# Patient Record
Sex: Female | Born: 1935 | Race: White | Hispanic: No | Smoking: Never smoker
Health system: Southern US, Community
[De-identification: ages and names within clinical notes are randomized; demographics above are authoritative.]

## PROBLEM LIST (undated history)

## (undated) DIAGNOSIS — Z974 Presence of external hearing-aid: Secondary | ICD-10-CM

## (undated) DIAGNOSIS — I639 Cerebral infarction, unspecified: Secondary | ICD-10-CM

## (undated) DIAGNOSIS — I350 Nonrheumatic aortic (valve) stenosis: Secondary | ICD-10-CM

## (undated) DIAGNOSIS — F329 Major depressive disorder, single episode, unspecified: Secondary | ICD-10-CM

## (undated) DIAGNOSIS — I671 Cerebral aneurysm, nonruptured: Secondary | ICD-10-CM

## (undated) DIAGNOSIS — I10 Essential (primary) hypertension: Secondary | ICD-10-CM

## (undated) DIAGNOSIS — I340 Nonrheumatic mitral (valve) insufficiency: Secondary | ICD-10-CM

## (undated) DIAGNOSIS — I4891 Unspecified atrial fibrillation: Secondary | ICD-10-CM

## (undated) DIAGNOSIS — I2510 Atherosclerotic heart disease of native coronary artery without angina pectoris: Secondary | ICD-10-CM

## (undated) DIAGNOSIS — R42 Dizziness and giddiness: Secondary | ICD-10-CM

## (undated) DIAGNOSIS — E785 Hyperlipidemia, unspecified: Secondary | ICD-10-CM

## (undated) DIAGNOSIS — R490 Dysphonia: Secondary | ICD-10-CM

## (undated) DIAGNOSIS — S42309A Unspecified fracture of shaft of humerus, unspecified arm, initial encounter for closed fracture: Secondary | ICD-10-CM

## (undated) HISTORY — DX: Atherosclerotic heart disease of native coronary artery without angina pectoris: I25.10

## (undated) HISTORY — PX: ABDOMINAL HYSTERECTOMY: SHX81

## (undated) HISTORY — DX: Unspecified fracture of shaft of humerus, unspecified arm, initial encounter for closed fracture: S42.309A

## (undated) HISTORY — DX: Nonrheumatic mitral (valve) insufficiency: I34.0

## (undated) HISTORY — DX: Nonrheumatic aortic (valve) stenosis: I35.0

## (undated) HISTORY — DX: Dysphonia: R49.0

## (undated) HISTORY — DX: Hyperlipidemia, unspecified: E78.5

## (undated) HISTORY — DX: Unspecified atrial fibrillation: I48.91

## (undated) HISTORY — PX: FRACTURE SURGERY: SHX138

## (undated) HISTORY — PX: CHOLECYSTECTOMY: SHX55

## (undated) HISTORY — PX: APPENDECTOMY: SHX54

## (undated) HISTORY — DX: Major depressive disorder, single episode, unspecified: F32.9

---

## 2003-04-23 ENCOUNTER — Other Ambulatory Visit: Payer: Self-pay

## 2004-03-04 ENCOUNTER — Ambulatory Visit: Payer: Self-pay | Admitting: Family Medicine

## 2004-08-16 ENCOUNTER — Emergency Department: Payer: Self-pay | Admitting: Emergency Medicine

## 2004-08-17 ENCOUNTER — Inpatient Hospital Stay (HOSPITAL_COMMUNITY): Admission: AD | Admit: 2004-08-17 | Discharge: 2004-08-24 | Payer: Self-pay | Admitting: *Deleted

## 2004-08-19 ENCOUNTER — Ambulatory Visit: Payer: Self-pay | Admitting: Internal Medicine

## 2005-02-11 ENCOUNTER — Ambulatory Visit (HOSPITAL_COMMUNITY): Admission: RE | Admit: 2005-02-11 | Discharge: 2005-02-12 | Payer: Self-pay | Admitting: *Deleted

## 2006-05-03 ENCOUNTER — Other Ambulatory Visit: Payer: Self-pay

## 2006-05-03 ENCOUNTER — Inpatient Hospital Stay: Payer: Self-pay | Admitting: Cardiology

## 2006-05-04 ENCOUNTER — Other Ambulatory Visit: Payer: Self-pay

## 2006-05-24 ENCOUNTER — Encounter: Payer: Self-pay | Admitting: Cardiology

## 2008-05-25 ENCOUNTER — Emergency Department: Payer: Self-pay | Admitting: Emergency Medicine

## 2008-12-17 ENCOUNTER — Emergency Department: Payer: Self-pay | Admitting: Emergency Medicine

## 2009-02-25 ENCOUNTER — Inpatient Hospital Stay: Payer: Self-pay | Admitting: Internal Medicine

## 2009-03-20 ENCOUNTER — Ambulatory Visit: Payer: Self-pay | Admitting: Gastroenterology

## 2009-04-05 ENCOUNTER — Inpatient Hospital Stay: Payer: Self-pay | Admitting: Internal Medicine

## 2009-12-15 ENCOUNTER — Ambulatory Visit: Payer: Self-pay | Admitting: Pain Medicine

## 2010-06-18 NOTE — Discharge Summary (Signed)
NAMESHALLA, BULLUCK               ACCOUNT NO.:  [id number]   MEDICAL RECORD NO.:  [id number]          PATIENT TYPE:  OIB   LOCATION:  6526                         FACILITY:  [facility]   PHYSICIAN:  Darlin Priestly, MD  DATE OF BIRTH:  01-Jan-1929   DATE OF ADMISSION:  02/11/2005  DATE OF DISCHARGE:  02/12/2005                                 DISCHARGE SUMMARY   Ms. Joerger is a patient of Dr. Lenise Herald, who came into the hospital for  outpatient cardiac catheterization secondary to increasing shortness of  breath, increasing fatigue, and some left-sided chest pain with known  disease in the past.  She underwent cardiac catheterization on February 11, 2005, by Dr. Lenise Herald.  She had a progressive disease with an 80% RCA  ostial lesion.  Previous PCI site was patent on her LAD.  She had a 50% OM2  lesion and a 50% circumflex.  Her EF was 60%.  She underwent cutting balloon  and stenting with a Cypher stent, 3.5 x 13 and 3.5 x 8, lesion reduced from  80 to 40%.  Postprocedure, she did well, however, she did have some 8/10 leg  pain on February 12, 2005.  It was running down the side of her leg.  The  groin area was sore, but it was soft, and there was no hematoma.  She was  given some Darvocet.  We thought that we might have to keep her overnight,  however, she had pain relief and was up and about after about one hour, and  was stable to be discharged home.  She had no chest pain.   LABORATORY DATA:  Hemoglobin 11.9, hematocrit 34.7, platelets 294,000, WBC  7.1, sodium 136, potassium 3.5, BUN 10, creatinine 1.0, CK 71/4.4 and  59/0.9.   She was seen by Dr. Jenne Campus and thought to be stable, and so she was  discharged home with followup in the office in two to three weeks.   DISCHARGE MEDICATIONS:  1.  Aspirin 81 mg daily.  2.  Plavix 75 mg once daily.  She is not to stop this.  3.  Inderal 30 mg once daily.  4.  Nexium 40 mg once daily.  5.  Lisinopril 40 mg once daily.  6.   Hydrochlorothiazide 25 mg daily.  7.  Fluoxetine 20 mg one time per day.  8.  Toprol-XL 50 mg one time per day.  9.  Vytorin 10/40 one time per day.  10. Darvocet-N 100 1-2 pills q.8h. for leg pain.   DISCHARGE INSTRUCTIONS:  She is to do no strenuous activity, lifting,  pushing or pulling for five days.  No extended walking for five days.  No  driving x2 days.   DIAGNOSIS DIAGNOSES:  1.  EUSAP with progressive coronary artery disease.  2.  Status post cutting balloon and right coronary artery Cypher stenting      with a 3.5 x 13 and 3.5 x 8 Cypher stent to her ostial right coronary      artery.  3.  Normal ejection fraction of 60%.  4.  Dyslipidemia.  5.  Hypertension.      Lezlie Octave, N.P.      Darlin Priestly, MD  Electronically Signed    BB/MEDQ  D:  02/14/2005  T:  02/15/2005  Job:  [id number]   cc:   Cay Schillings  Fax: [phone]

## 2010-06-18 NOTE — Discharge Summary (Signed)
[name], Samantha [name]               ACCOUNT NO.:  [id number]   MEDICAL RECORD NO.:  [id number]          PATIENT TYPE:  INP   LOCATION:  2040                         FACILITY:  [facility]   PHYSICIAN:  Darlin Priestly, MD  DATE OF BIRTH:  17-Jun-1928   DATE OF ADMISSION:  08/17/2004  DATE OF DISCHARGE:  08/24/2004                                 DISCHARGE SUMMARY   ADDENDUM:  [name] was all ready for discharge on August 19, 2004 when she developed  severe nausea and multiple vomiting and we ordered abdominal ultrasound and  the result revealed sludge in the gallbladder and increased thickness of the  gallbladder wall.  We requested GI consult and Dr. Leone Payor saw patient.  She  was diagnosed with acute cholecystitis and possible cholelithiasis and in  follow-up surgical consult was ordered and Dr. Ovidio Kin saw patient but  because of her recent CYPHER stent implantation patient was not considered a  suitable surgical candidate because she had to continue anticoagulation  therapy with Plavix.  She was started on IV antibiotic Unasyn and her  symptoms significantly improved.   On day of discharge, August 24, 2004 her vital signs were stable.  She was  free of pain in subxiphoid and right upper quadrant area.  Her white blood  count was 6000, hemoglobin 11.5, hematocrit 34.2, and platelets 309.  Potassium was 4.  Renal function stable.  BUN 8, creatinine 0.8.   Patient was discharged home in stable condition.  Her main problem remains  acute cholecystitis and recommendations were to continue antibiotics.  She  was switched to p.o. Augmentin the day prior to discharge and she would need  to follow up with her primary physician, Dr. Evelene Croon in [place] and number  was given for patient to call and schedule an appointment in one to two  weeks to be seen by him and all further recommendations are per her primary  physician as far as her cholecystitis treatment.  We need to keep it in mind  the  patient needs to continue Plavix therapy at least for six months after  CYPHER stent was inserted into the coronary artery.       MK/MEDQ  D:  08/24/2004  T:  08/24/2004  Job:  [id number]

## 2010-06-18 NOTE — Cardiovascular Report (Signed)
Samantha [name], Samantha [name]               ACCOUNT NO.:  [id number]   MEDICAL RECORD NO.:  [id number]          PATIENT TYPE:  OIB   LOCATION:  6526                         FACILITY:  [facility]   PHYSICIAN:  Darlin Priestly, MD  DATE OF BIRTH:  Jul 03, 1928   DATE OF PROCEDURE:  02/11/2005  DATE OF DISCHARGE:  02/12/2005                              CARDIAC CATHETERIZATION   PROCEDURE:  1.  Left heart catheterization.  2.  Coronary angiography.  3.  Left ventriculogram.  4.  Right coronary artery - ostial - cutting balloon angioplasty - placement      of new coronary stent x 2.   ATTENDING PHYSICIAN:  Darlin Priestly, M.D.   COMPLICATIONS:  None.   INDICATIONS FOR PROCEDURE:  Samantha [name] is a 77 year old female patient of Dr.  Cay [name] in [place] with a history of CAD status post PTCA and stenting of  her LAD by Dr. Allyson [name] in July 2006 with two 5 by 8 mm Taxus stents.  She did  have scattered disease of the circumflex as well as possible spasm of the  ostium of the RCA.  She has continued to complain of increasing chest pain  and shortness of breath and is now brought for repeat catheterization to  reassess her coronary anatomy.   DESCRIPTION OF PROCEDURE:  After giving informed written consent, the  patient was brought to the cardiac cath lab were the right and left groins  were shaved, prepped and draped in a sterile fashion.  ECG monitoring was  established.  Using modified Seldinger technique, a 6 French arterial sheath  was inserted in the right femoral artery.  6 French diagnostic catheters  were used to perform diagnostic angiography.   The left main is a medium to large size vessel with no significant disease.   The LAD is a medium size vessel coursing to the apex with two diagonal  branch.  The stent is noted in the mid portion of the LAD between the first  and second diagonals which appears patent.  The LAD in the mid and distal  portions is a small portion with no significant  disease.   The first and second diagonals were small to medium size vessels with a mild  60% ostial narrowing in the first diagonal.   The left circumflex is a medium size vessel which courses to the AV groove  and gives rise to two obtuse marginal branches, though the first OM appears  to be totally occluded at its ostium.  There is a 50% mid AV groove  circumflex lesion.  The second OM is a medium size vessel which bifurcates  distally with a 60-70% distal mid stenosis.   The right coronary artery is a large vessel which is dominant and gives rise  to both PDA and posterolateral branch.  There is an 80-90% ostial RCA lesion  with pressure dampening when engaged.   Left ventriculogram reveals preserved EF of 60%.   HEMODYNAMICS:  Systemic arterial pressure 147/73.   INTERVENTIONAL PROCEDURE:  RCA - ostial.  Following diagnostic angiography,  a number 6 [place] JR4  guiding catheter with sideholes was used to engage the  right coronary ostium.  A 0.014 Asahi guide-wire was used to cross the  ostial lesion and positioned in the distal PDA without difficulty.  Next, a  3 by 6 mm cutting balloon was then positioned in the ostial portion of the  RCA and two inflations to 6 atmospheres were performed for a total of  approximately one minute.  Follow up angiogram revealed good luminal gain.  Next, a 3.5 by 13 mm Cypher stent was then positioned at what appeared to be  ostial portion of the RCA.  This stent was deployed to 12 atmospheres for a  total of 30 seconds.  A second inflation to 16 atmospheres was performed for  approximately 25 seconds.  Follow up angiogram revealed good luminal gain,  however, it appeared that the ostial portion of the vessel had been missed.  We were ultimately able to re-wire the vessel and perform a PTCA using a 3.5  by 15 mm Maverick balloon up to approximately 10 atmospheres.  This balloon  was then removed and a second Cypher 3.5 by 8 mm stent was then used  to  overlap the previously placed stent with careful attention to extending  across the ostium.  This stent was then deployed to 12 atmospheres for a  total of 30 seconds.  A second inflation to 16 atmospheres was performed for  approximately 20 seconds.  One inflation in the overlapping segment of the  stent was performed for a total to 16 atmospheres performed for  approximately 15 seconds.  Follow up angiogram revealed good luminal gain  with no evidence of dissection or thrombus.  IV Angiomax was used throughout  the case.   Final follow up angiograms revealed approximately 20-30% residual ostial  stenosis with TIMI III flow to the distal vessel.  At this point, we elected  to conclude the procedure.  All balloons, wires, and catheters were removed.  A hemostatic sheath was sewn in place.  The patient was transferred back to  the ward in stable condition.   CONCLUSION:  1.  Successful cutting balloon angioplasty and placement of two Cypher      stents (3.5 by 13, 3.5 by 8), in the proximal and ostial portion of the      RCA.  2.  Normal LV systolic function.  3.  Adjuvant use of Angiomax infusion.  4.  Enrollment in the [place] study looking at IV Cangrelor versus Plavix.      Darlin Priestly, MD  Electronically Signed     RHM/MEDQ  D:  02/11/2005  T:  02/13/2005  Job:  [id number]   cc:   Samantha [name], M.D.

## 2010-06-18 NOTE — H&P (Signed)
NAMEERYCA, BOLTE NO.:  [id number]   MEDICAL RECORD NO.:  [id number]          PATIENT TYPE:  INP   LOCATION:  2930                         FACILITY:  [facility]   PHYSICIAN:  Ulyses Amor, MD DATE OF BIRTH:  03-21-1928   DATE OF ADMISSION:  08/17/2004  DATE OF DISCHARGE:                                HISTORY & PHYSICAL   REASON FOR ADMISSION:  Samantha [name] is a 77 year old white woman with is  transferred from [facility] to [facility] for further evaluation of chest pain.   HISTORY OF PRESENT ILLNESS:  The patient, who has no past history of cardiac  disease, experienced sudden onset of chest pain this evening while she was  putting her mother to bed, though heavy physical exertion was involved. The  chest pain was described as a sharp discomfort located in a very focal  region at the left parasternal margin second intercostal space. It was  associated with dyspnea. There was no diaphoresis or nausea. The chest pain  radiated to beneath both breasts. There were no exacerbating or ameliorating  factors. It appeared not to be related to position, activity, meals, or  respirations. The chest pain continued through the time she arrived in the  emergency department at [facility]. It was improved, she reported, with some  pain medication. The patient's chest pain is present now but has diminished  significantly since onset.   As noted, the patient has no past history of cardiac disease including no  history of myocardial infarction, congestive heart failure, or arrhythmias.  She has experienced a similar pain in the past and recently underwent stress  testing in [place]. Her test was reported normal. The patient has a  number of risk factors for coronary artery disease including hypertension,  dyslipidemia, and a family history of coronary artery disease. There is no  history of diabetes mellitus or smoking.   The  patient also has a history of anxiety and depression.   MEDICATIONS:  Lisinopril, Nexium, Zocor, Atenolol, Clonazepam, and  fluoxetine.   ALLERGIES:  None.   SOCIAL HISTORY:  The patient lives with her mother. She denies smoking or  drinking. She does work.   PAST MEDICAL HISTORY:  Significant injuries are none.   PAST SURGICAL HISTORY:  1.  Mastoid surgery.  2.  Appendectomy.  3.  Hysterectomy.   REVIEW OF SYSTEMS:  Reveals no problems related to her head, eyes, ears,  nose, mouth, throat, lungs, gastrointestinal system, genitourinary system,  or extremities. There was no history of neurologic or psychiatric disorder.  There is no history of fever, chills, or weight loss.   PHYSICAL EXAMINATION:  VITAL SIGNS:  Blood pressure 145/43, pulse 73 and  regular, respirations 21. Pulse oximeter 96% on two liters.  GENERAL:  The patient was an elderly white woman in no discomfort. She was  alert, oriented, appropriate, and responsive.  HEENT:  Normal.  NECK:  Without thyromegaly or adenopathy. Carotid pulses were palpable  bilaterally and symmetric.  CARDIOVASCULAR:  Normal S1 and S2. There was no S3, S4, rubs, or  clicks.  Cardiac rate was regular.  CHEST:  Palpation of the left intercostal space at the left parasternal  margin exacerbated the chest pain.  LUNGS:  Clear.  ABDOMEN:  Soft and nontender. There was no mass, hepatosplenomegaly, bruit,  distension, rebound, guarding, or rigidity. Bowel sounds were normal.  BREASTS:  Not performed as they were not pertinent for the reason for acute  care hospitalization.  RECTAL:  Not performed as they were not pertinent for the reason for acute  care hospitalization.  PELVIC:  Not performed as they were not pertinent for the reason for acute  care hospitalization.  EXTREMITIES:  Without edema, deviation, or deformity.  PULSES:  Radial and dorsalis pedis pulses were palpable bilaterally.  NEUROLOGICAL:  Unremarkable.   LABORATORY DATA:   The electrocardiogram revealed T-wave flattening in the  lateral leads. The chest radiograph was pending at the time of this  dictation. Blood work from [facility] revealed a BUN of 14, creatinine of 0.8.  White count was 8.8 with hemoglobin 13.4, and 35.0. The blood work from  [facility] was pending at the time of this dictation.   IMPRESSION:  1.  Chest pain:  Rule out coronary artery disease. The chest pain is sharp,      focal (left parasternal margin/second intercostal space), radiates below      both breasts bilaterally, and is exacerbated by chest wall palpation.      She has a recent history of a similar such pain, a stress test was      reportedly negative.  2.  Hypertension.  3.  Dyslipidemia.  4.  Anxiety and depression.   PLAN:  1.  Coronary Care Unit.  2.  Serial cardiac enzymes.  3.  Aspirin.  4.  Intravenous heparin.  5.  Intravenous nitroglycerin.  6.  Morphine.  7.  Fasting lipid profile.  8.  Further measures per Dr. Darlin Priestly.       MSC/MEDQ  D:  08/17/2004  T:  08/17/2004  Job:  [id number]   cc:   Darlin Priestly, MD  [address], [address]  [place]  [place] [address]  Fax: [phone]

## 2010-06-18 NOTE — Discharge Summary (Signed)
Samantha [name], Samantha [name]               ACCOUNT NO.:  [id number]   MEDICAL RECORD NO.:  [id number]          PATIENT TYPE:  INP   LOCATION:  2930                         FACILITY:  [facility]   PHYSICIAN:  Nanetta Batty, M.D.   DATE OF BIRTH:  1929/01/21   DATE OF ADMISSION:  08/17/2004  DATE OF DISCHARGE:  08/18/2004                                 DISCHARGE SUMMARY   DISCHARGE DIAGNOSES:  1.  Chest pain, status post coronary intervention during this admission.  2.  Gastroesophageal reflux disease.  3.  Hyperlipidemia.  4.  Hypertension.  5.  Major anxiety disorder.   HISTORY OF PRESENT ILLNESS/HOSPITAL COURSE:  This 77 year old Caucasian  female who was transferred to [facility] from [facility] where she was evaluated initially for chest pain.  She has a history of hypertension, hyperlipidemia but no prior history of  coronary disease.  She has experienced an onset of sudden chest pain the  evening prior to her presentation and describes it as a sharp discomfort in  the focal region at the left pars adrenal margin __________.  There was no  diaphoresis or nausea but she also complained of dyspnea.  Patient also  noted the pain radiated under her breasts bilaterally and appeared not to be  related to her position, activity, meals or respirations.  Continued through  the whole time she arrived in the emergency department in [place] and  improved somewhat with the administration of pain medication.  When she was  transferred to [facility], her cardiac enzymes were  normal.  Dr. Clarene Duke saw patient and decision was made to proceed with the  cardiac catheterization.   PROCEDURE:  Coronary angiography performed by Dr. Allyson Sabal.  It showed two  vessel heart disease with LAD and circumflex involvement.  Circumflex had  50% mid and distal lesions and LAD had 40% proximal lesion, 90% mid portion  stenosis and 50 to 60% distal lesion.   Dr. Allyson Sabal performed stenting of the  LAD using TAXUS stent with reduction of the lesion from 90 to 0%.  Patient  was given Integrilin bolus and drip over night.   The next morning she was evaluated by Dr. Clarene Duke and was found to be stable  from cardiac standpoint.  Vital signs remained stable and she was discharged  home on the following medications.   DISCHARGE MEDICATIONS:  1.  Aspirin 81 mg daily.  2.  Plavix 75 mg daily.  3.  Lisinopril 20 mg daily.  4.  Nexium 40 mg daily.  5.  Atenolol 50 mg daily.  6.  Zocor 20 mg daily.  7.  Clonazepam 0.5 to 1 mg as needed.  8.  Fluoxetine 20 mg daily.   FOLLOW UP APPOINTMENT:  Dr. Jenne Campus will see the patient on September 03, 2004,  at 3:45.   DISCHARGE DIET:  Low fat, low cholesterol, low salt diet.   ACTIVITY:  She was advised not to engage in any strenuous activities, no  lifting greater than five pounds, no driving for three days. She was allowed  to take a shower, instructed to report any problems with groin site to our  office and number was provided.       MK/MEDQ  D:  08/18/2004  T:  08/18/2004  Job:  [id number]   cc:   [facility]

## 2010-06-18 NOTE — Cardiovascular Report (Signed)
NAMEBETHLEHEM, LANGSTAFF NO.:  [id number]   MEDICAL RECORD NO.:  [id number]          PATIENT TYPE:  INP   LOCATION:  2930                         FACILITY:  [facility]   PHYSICIAN:  Nanetta Batty, M.D.   DATE OF BIRTH:  16-Jun-1928   DATE OF PROCEDURE:  08/17/2004  DATE OF DISCHARGE:                              CARDIAC CATHETERIZATION   Samantha [name] is a 77 year old white female admitted yesterday in transfer from  [facility] with chest pain. She has history of hypertension or  dyslipidemia. She had no acute electrocardiogram changes and ruled out for  myocardial infarction. She presents now for diagnostic coronary  arteriography to define her anatomy, to rule out an ischemic etiology.   PROCEDURE DESCRIPTION:  The patient is brought to the second floor [facility] cardiac catheterization laboratory in the post absorptive state. She  was premedicated with p.o. Valium, IV Versed, and fentanyl. Her right groin  was prepped and shaved in the usual sterile fashion. 1% Xylocaine was used  for local anesthesia. A 6-French sheath was inserted into the right femoral  artery using a standard Seldinger technique. A 6-French right and left  Judkins' diagnostic catheter as well as a 6-French pigtail catheter and 5-  French right Judkins' catheter were used for selective coronary angiography,  left ventriculography, selective left internal mammary artery arteriography,  and distal abdominal aortography. Visipaque dye was used throughout the  entire case. Retrograde aorta, left ventricle, and pulmonary artery  pressures were recorded.   HEMODYNAMICS:  1.  Aortic systolic pressure 151, diastolic pressure 73. Left ventricular      systolic function 144 and diastolic pressure of 21.   CORONARY ANGIOGRAPHY:  1.  Left main normal.  2.  LAD: The LAD had a segmental calcified 40% proximal stenosis after the      first diagonal branch. There was a 90% eccentric stenosis in the mid  LAD      just prior to the second diagonal branch. There was a 50% to 60% distal      segmental LAD lesion.  3.  Left circumflex: The distal had a 50% long segmental mid AV groove      circumflex, a 50% distal segmental circumflex lesion.  4.  Right coronary artery: This is a dominant vessel with mild anterior      takeoff. There was spasm noted at the ostium with dampening which      resolved with intracoronary nitroglycerin.   LEFT VENTRICULOGRAPHY:  RAO left ventriculogram was performed using 20 cc of  Visipaque dye at 10 cc per second. The overall  LVEF is estimated at greater  than 60% without focal wall motion abnormalities.  Left internal mammary artery: This vessel was selectively visualized and was  widely patent and suitable for use in coronary artery bypass grafting.   DISTAL ABDOMINAL AORTOGRAPHY:  Performed using 20 cc of Visipaque dye at  20  cc per second. The renal artery was widely patent. The infrarenal, abdominal  aorta, and __________ appear free of significant changes.   IMPRESSION:  Ms. Hakim has high-grade mid eccentric left  anterior descending  lesion just proximal to the second diagonal branch. We will proceed with PCI  stenting, drug eluting stent, and IIb/IIIa inhibitor.   PROCEDURE DESCRIPTION:  The patient received 3000 units of heparin  intravenously with an ACT of 238 at the end. She was on aspirin and received  600 mg of p.o. Plavix as well as Integrilin double bolus infusion. Visipaque  dye was used throughout the entirety of the intervention. Aortic pressures  were monitored during the case.  200 mcg of intracoronary nitroglycerin was  given twice during the case.   Using a 6-French JL35 guide catheter as well as an OM4190 cm long Asahi soft  wire and a 225-10 Voyager PTCA was performed abdominal pressures. Cutting  balloon initially would not cross the lesion. Following this a 2.5 x  10  Taxus drug-eluting stent was then carefully manipulated across  the lesion  under fluoroscopic and angiographic control, landing just proximal to the  second diagonal branch. This was then deployed at 15 atmospheres, resulting  in reduction of a 90% eccentric mid LAD lesion with 0% residual, with  excellent flow and without dissection. The patient tolerated the procedure  well. She did complain of some chest and arm pain and had mild ST segment  elevation with balloon inflation which resolved with balloon deflation.   OVERALL IMPRESSION:  Successful mid left anterior descending PCI and  stenting using Taxus drug-eluting stent and Integrilin. Guide wire and  catheters were removed. The sheath was upgraded to a 7 because of oozing  around the sheath. It was since secured in place. The patient left the lab  in stable condition.   PLAN:  Discontinue heparin, remove the sheath once ACT falls to 150.  Integrilin will be continued overnight. She will be treated with aspirin and  Plavix, and discharged home in the morning. She left the lab in stable  condition.       JB/MEDQ  D:  08/17/2004  T:  08/17/2004  Job:  [id number]   cc:   [facility] Cardiac Cath lab

## 2010-12-07 ENCOUNTER — Ambulatory Visit: Payer: Self-pay | Admitting: Ophthalmology

## 2011-03-21 DIAGNOSIS — I10 Essential (primary) hypertension: Secondary | ICD-10-CM | POA: Diagnosis not present

## 2011-04-04 DIAGNOSIS — H903 Sensorineural hearing loss, bilateral: Secondary | ICD-10-CM | POA: Diagnosis not present

## 2011-04-04 DIAGNOSIS — H7010 Chronic mastoiditis, unspecified ear: Secondary | ICD-10-CM | POA: Diagnosis not present

## 2011-10-21 DIAGNOSIS — G4700 Insomnia, unspecified: Secondary | ICD-10-CM | POA: Insufficient documentation

## 2011-10-21 DIAGNOSIS — E538 Deficiency of other specified B group vitamins: Secondary | ICD-10-CM | POA: Insufficient documentation

## 2011-10-24 DIAGNOSIS — F329 Major depressive disorder, single episode, unspecified: Secondary | ICD-10-CM | POA: Diagnosis not present

## 2011-10-24 DIAGNOSIS — Z23 Encounter for immunization: Secondary | ICD-10-CM | POA: Diagnosis not present

## 2011-10-24 DIAGNOSIS — I10 Essential (primary) hypertension: Secondary | ICD-10-CM | POA: Diagnosis not present

## 2011-10-24 DIAGNOSIS — E538 Deficiency of other specified B group vitamins: Secondary | ICD-10-CM | POA: Diagnosis not present

## 2011-10-24 DIAGNOSIS — E785 Hyperlipidemia, unspecified: Secondary | ICD-10-CM | POA: Diagnosis not present

## 2011-10-24 DIAGNOSIS — I2510 Atherosclerotic heart disease of native coronary artery without angina pectoris: Secondary | ICD-10-CM | POA: Diagnosis not present

## 2012-06-11 ENCOUNTER — Emergency Department: Payer: Self-pay | Admitting: Emergency Medicine

## 2012-06-11 DIAGNOSIS — I10 Essential (primary) hypertension: Secondary | ICD-10-CM | POA: Diagnosis not present

## 2012-06-11 DIAGNOSIS — Z9089 Acquired absence of other organs: Secondary | ICD-10-CM | POA: Diagnosis not present

## 2012-06-11 DIAGNOSIS — R079 Chest pain, unspecified: Secondary | ICD-10-CM | POA: Diagnosis not present

## 2012-06-11 DIAGNOSIS — S8010XA Contusion of unspecified lower leg, initial encounter: Secondary | ICD-10-CM | POA: Diagnosis not present

## 2012-06-11 DIAGNOSIS — Z95818 Presence of other cardiac implants and grafts: Secondary | ICD-10-CM | POA: Diagnosis not present

## 2012-06-11 DIAGNOSIS — E785 Hyperlipidemia, unspecified: Secondary | ICD-10-CM | POA: Diagnosis not present

## 2012-06-11 DIAGNOSIS — I2510 Atherosclerotic heart disease of native coronary artery without angina pectoris: Secondary | ICD-10-CM | POA: Diagnosis not present

## 2012-06-11 DIAGNOSIS — M79609 Pain in unspecified limb: Secondary | ICD-10-CM | POA: Diagnosis not present

## 2012-06-11 LAB — BASIC METABOLIC PANEL
Anion Gap: 3 — ABNORMAL LOW (ref 7–16)
BUN: 21 mg/dL — ABNORMAL HIGH (ref 7–18)
Chloride: 106 mmol/L (ref 98–107)
Potassium: 4.3 mmol/L (ref 3.5–5.1)
Sodium: 139 mmol/L (ref 136–145)

## 2012-06-11 LAB — TROPONIN I: Troponin-I: <0.02 ng/mL

## 2012-06-11 LAB — CBC
HCT: 37 % (ref 35.0–47.0)
MCHC: 33.3 g/dL (ref 32.0–36.0)
MCV: 91 fL (ref 80–100)
Platelet: 300 10*3/uL (ref 150–440)
RBC: 4.08 10*6/uL (ref 3.80–5.20)
RDW: 12.9 % (ref 11.5–14.5)

## 2012-06-11 LAB — CK TOTAL AND CKMB (NOT AT ARMC): CK, Total: 71 U/L (ref 21–215)

## 2012-06-13 DIAGNOSIS — S8010XA Contusion of unspecified lower leg, initial encounter: Secondary | ICD-10-CM | POA: Diagnosis not present

## 2012-06-15 DIAGNOSIS — S63509A Unspecified sprain of unspecified wrist, initial encounter: Secondary | ICD-10-CM | POA: Diagnosis not present

## 2012-06-15 DIAGNOSIS — S8010XA Contusion of unspecified lower leg, initial encounter: Secondary | ICD-10-CM | POA: Diagnosis not present

## 2012-08-31 DIAGNOSIS — I10 Essential (primary) hypertension: Secondary | ICD-10-CM | POA: Diagnosis not present

## 2012-08-31 DIAGNOSIS — Z7982 Long term (current) use of aspirin: Secondary | ICD-10-CM | POA: Diagnosis not present

## 2012-08-31 DIAGNOSIS — Z9861 Coronary angioplasty status: Secondary | ICD-10-CM | POA: Diagnosis not present

## 2012-08-31 DIAGNOSIS — Z4789 Encounter for other orthopedic aftercare: Secondary | ICD-10-CM | POA: Diagnosis not present

## 2012-08-31 DIAGNOSIS — I2510 Atherosclerotic heart disease of native coronary artery without angina pectoris: Secondary | ICD-10-CM | POA: Diagnosis not present

## 2012-08-31 DIAGNOSIS — S0993XA Unspecified injury of face, initial encounter: Secondary | ICD-10-CM | POA: Diagnosis not present

## 2012-08-31 DIAGNOSIS — E785 Hyperlipidemia, unspecified: Secondary | ICD-10-CM | POA: Diagnosis not present

## 2012-08-31 DIAGNOSIS — M47812 Spondylosis without myelopathy or radiculopathy, cervical region: Secondary | ICD-10-CM | POA: Diagnosis not present

## 2012-08-31 DIAGNOSIS — Z7901 Long term (current) use of anticoagulants: Secondary | ICD-10-CM | POA: Diagnosis not present

## 2012-08-31 DIAGNOSIS — M25529 Pain in unspecified elbow: Secondary | ICD-10-CM | POA: Diagnosis not present

## 2012-08-31 DIAGNOSIS — K219 Gastro-esophageal reflux disease without esophagitis: Secondary | ICD-10-CM | POA: Diagnosis not present

## 2012-08-31 DIAGNOSIS — S52023A Displaced fracture of olecranon process without intraarticular extension of unspecified ulna, initial encounter for closed fracture: Secondary | ICD-10-CM | POA: Diagnosis not present

## 2012-08-31 DIAGNOSIS — T1490XA Injury, unspecified, initial encounter: Secondary | ICD-10-CM | POA: Diagnosis not present

## 2012-08-31 DIAGNOSIS — R918 Other nonspecific abnormal finding of lung field: Secondary | ICD-10-CM | POA: Diagnosis not present

## 2012-08-31 DIAGNOSIS — R42 Dizziness and giddiness: Secondary | ICD-10-CM | POA: Diagnosis not present

## 2012-08-31 DIAGNOSIS — S42409A Unspecified fracture of lower end of unspecified humerus, initial encounter for closed fracture: Secondary | ICD-10-CM | POA: Diagnosis not present

## 2012-08-31 DIAGNOSIS — I69922 Dysarthria following unspecified cerebrovascular disease: Secondary | ICD-10-CM | POA: Diagnosis not present

## 2012-08-31 DIAGNOSIS — S0990XA Unspecified injury of head, initial encounter: Secondary | ICD-10-CM | POA: Diagnosis not present

## 2012-08-31 DIAGNOSIS — S42309A Unspecified fracture of shaft of humerus, unspecified arm, initial encounter for closed fracture: Secondary | ICD-10-CM | POA: Diagnosis not present

## 2012-08-31 DIAGNOSIS — M25519 Pain in unspecified shoulder: Secondary | ICD-10-CM | POA: Diagnosis not present

## 2012-09-03 DIAGNOSIS — I10 Essential (primary) hypertension: Secondary | ICD-10-CM | POA: Diagnosis not present

## 2012-09-03 DIAGNOSIS — M7989 Other specified soft tissue disorders: Secondary | ICD-10-CM | POA: Diagnosis not present

## 2012-09-03 DIAGNOSIS — S42309D Unspecified fracture of shaft of humerus, unspecified arm, subsequent encounter for fracture with routine healing: Secondary | ICD-10-CM | POA: Diagnosis not present

## 2012-09-04 DIAGNOSIS — R5381 Other malaise: Secondary | ICD-10-CM | POA: Diagnosis not present

## 2012-09-04 DIAGNOSIS — G8918 Other acute postprocedural pain: Secondary | ICD-10-CM | POA: Diagnosis not present

## 2012-09-04 DIAGNOSIS — Z7189 Other specified counseling: Secondary | ICD-10-CM | POA: Diagnosis not present

## 2012-09-04 DIAGNOSIS — Z5189 Encounter for other specified aftercare: Secondary | ICD-10-CM | POA: Diagnosis not present

## 2012-09-04 DIAGNOSIS — S5290XD Unspecified fracture of unspecified forearm, subsequent encounter for closed fracture with routine healing: Secondary | ICD-10-CM | POA: Diagnosis not present

## 2012-09-04 DIAGNOSIS — R262 Difficulty in walking, not elsewhere classified: Secondary | ICD-10-CM | POA: Diagnosis not present

## 2012-09-04 DIAGNOSIS — I10 Essential (primary) hypertension: Secondary | ICD-10-CM | POA: Diagnosis not present

## 2012-09-06 DIAGNOSIS — Z8673 Personal history of transient ischemic attack (TIA), and cerebral infarction without residual deficits: Secondary | ICD-10-CM | POA: Diagnosis not present

## 2012-09-06 DIAGNOSIS — IMO0001 Reserved for inherently not codable concepts without codable children: Secondary | ICD-10-CM | POA: Diagnosis not present

## 2012-09-06 DIAGNOSIS — Z4789 Encounter for other orthopedic aftercare: Secondary | ICD-10-CM | POA: Diagnosis not present

## 2012-09-06 DIAGNOSIS — I10 Essential (primary) hypertension: Secondary | ICD-10-CM | POA: Diagnosis not present

## 2012-09-06 DIAGNOSIS — Z9861 Coronary angioplasty status: Secondary | ICD-10-CM | POA: Diagnosis not present

## 2012-09-06 DIAGNOSIS — Z9889 Other specified postprocedural states: Secondary | ICD-10-CM | POA: Diagnosis not present

## 2012-09-10 DIAGNOSIS — S42309D Unspecified fracture of shaft of humerus, unspecified arm, subsequent encounter for fracture with routine healing: Secondary | ICD-10-CM | POA: Diagnosis not present

## 2012-09-24 DIAGNOSIS — S42309D Unspecified fracture of shaft of humerus, unspecified arm, subsequent encounter for fracture with routine healing: Secondary | ICD-10-CM | POA: Diagnosis not present

## 2012-10-09 DIAGNOSIS — M810 Age-related osteoporosis without current pathological fracture: Secondary | ICD-10-CM | POA: Diagnosis not present

## 2012-10-09 DIAGNOSIS — I10 Essential (primary) hypertension: Secondary | ICD-10-CM | POA: Diagnosis not present

## 2012-10-09 DIAGNOSIS — M79609 Pain in unspecified limb: Secondary | ICD-10-CM | POA: Diagnosis not present

## 2012-10-09 DIAGNOSIS — R51 Headache: Secondary | ICD-10-CM | POA: Diagnosis not present

## 2012-10-16 ENCOUNTER — Other Ambulatory Visit: Payer: Self-pay | Admitting: Family Medicine

## 2012-10-16 DIAGNOSIS — M79609 Pain in unspecified limb: Secondary | ICD-10-CM | POA: Diagnosis not present

## 2012-10-16 DIAGNOSIS — R51 Headache: Secondary | ICD-10-CM | POA: Diagnosis not present

## 2012-10-16 DIAGNOSIS — M899 Disorder of bone, unspecified: Secondary | ICD-10-CM | POA: Diagnosis not present

## 2012-10-16 DIAGNOSIS — R791 Abnormal coagulation profile: Secondary | ICD-10-CM | POA: Diagnosis not present

## 2012-10-16 LAB — SEDIMENTATION RATE: Erythrocyte Sed Rate: 30 mm/hr (ref 0–30)

## 2012-10-29 DIAGNOSIS — I10 Essential (primary) hypertension: Secondary | ICD-10-CM | POA: Diagnosis not present

## 2012-10-29 DIAGNOSIS — M810 Age-related osteoporosis without current pathological fracture: Secondary | ICD-10-CM | POA: Diagnosis not present

## 2012-11-07 DIAGNOSIS — D492 Neoplasm of unspecified behavior of bone, soft tissue, and skin: Secondary | ICD-10-CM | POA: Diagnosis not present

## 2012-11-07 DIAGNOSIS — I10 Essential (primary) hypertension: Secondary | ICD-10-CM | POA: Diagnosis not present

## 2012-11-22 DIAGNOSIS — Z23 Encounter for immunization: Secondary | ICD-10-CM | POA: Diagnosis not present

## 2013-03-19 DIAGNOSIS — R0602 Shortness of breath: Secondary | ICD-10-CM | POA: Diagnosis not present

## 2013-03-19 DIAGNOSIS — I499 Cardiac arrhythmia, unspecified: Secondary | ICD-10-CM | POA: Diagnosis not present

## 2013-03-20 ENCOUNTER — Inpatient Hospital Stay: Payer: Self-pay | Admitting: Internal Medicine

## 2013-03-20 DIAGNOSIS — J209 Acute bronchitis, unspecified: Secondary | ICD-10-CM | POA: Diagnosis present

## 2013-03-20 DIAGNOSIS — F3289 Other specified depressive episodes: Secondary | ICD-10-CM | POA: Diagnosis present

## 2013-03-20 DIAGNOSIS — I2510 Atherosclerotic heart disease of native coronary artery without angina pectoris: Secondary | ICD-10-CM | POA: Diagnosis not present

## 2013-03-20 DIAGNOSIS — J069 Acute upper respiratory infection, unspecified: Secondary | ICD-10-CM | POA: Diagnosis not present

## 2013-03-20 DIAGNOSIS — Z9861 Coronary angioplasty status: Secondary | ICD-10-CM | POA: Diagnosis not present

## 2013-03-20 DIAGNOSIS — I509 Heart failure, unspecified: Secondary | ICD-10-CM | POA: Diagnosis present

## 2013-03-20 DIAGNOSIS — R51 Headache: Secondary | ICD-10-CM | POA: Diagnosis not present

## 2013-03-20 DIAGNOSIS — R748 Abnormal levels of other serum enzymes: Secondary | ICD-10-CM | POA: Diagnosis not present

## 2013-03-20 DIAGNOSIS — E119 Type 2 diabetes mellitus without complications: Secondary | ICD-10-CM | POA: Diagnosis present

## 2013-03-20 DIAGNOSIS — I10 Essential (primary) hypertension: Secondary | ICD-10-CM | POA: Diagnosis not present

## 2013-03-20 DIAGNOSIS — R0602 Shortness of breath: Secondary | ICD-10-CM | POA: Diagnosis not present

## 2013-03-20 DIAGNOSIS — I248 Other forms of acute ischemic heart disease: Secondary | ICD-10-CM | POA: Diagnosis present

## 2013-03-20 DIAGNOSIS — I200 Unstable angina: Secondary | ICD-10-CM | POA: Diagnosis not present

## 2013-03-20 DIAGNOSIS — E785 Hyperlipidemia, unspecified: Secondary | ICD-10-CM | POA: Diagnosis present

## 2013-03-20 DIAGNOSIS — F329 Major depressive disorder, single episode, unspecified: Secondary | ICD-10-CM | POA: Diagnosis present

## 2013-03-20 DIAGNOSIS — I214 Non-ST elevation (NSTEMI) myocardial infarction: Secondary | ICD-10-CM | POA: Diagnosis not present

## 2013-03-20 DIAGNOSIS — R079 Chest pain, unspecified: Secondary | ICD-10-CM | POA: Diagnosis not present

## 2013-03-20 DIAGNOSIS — Z7902 Long term (current) use of antithrombotics/antiplatelets: Secondary | ICD-10-CM | POA: Diagnosis not present

## 2013-03-20 DIAGNOSIS — Z7982 Long term (current) use of aspirin: Secondary | ICD-10-CM | POA: Diagnosis not present

## 2013-03-20 LAB — URINALYSIS, COMPLETE
BACTERIA: NONE SEEN
BILIRUBIN, UR: NEGATIVE
Blood: NEGATIVE
GLUCOSE, UR: NEGATIVE mg/dL (ref 0–75)
Ketone: NEGATIVE
Leukocyte Esterase: NEGATIVE
Nitrite: NEGATIVE
PROTEIN: NEGATIVE
Ph: 5 (ref 4.5–8.0)
Specific Gravity: 1.018 (ref 1.003–1.030)
WBC UR: 2 /HPF (ref 0–5)

## 2013-03-20 LAB — COMPREHENSIVE METABOLIC PANEL
ALK PHOS: 88 U/L
ANION GAP: 9 (ref 7–16)
AST: 31 U/L (ref 15–37)
Albumin: 3.4 g/dL (ref 3.4–5.0)
BUN: 10 mg/dL (ref 7–18)
Bilirubin,Total: 0.3 mg/dL (ref 0.2–1.0)
Calcium, Total: 9 mg/dL (ref 8.5–10.1)
Chloride: 102 mmol/L (ref 98–107)
Co2: 24 mmol/L (ref 21–32)
Creatinine: 0.84 mg/dL (ref 0.60–1.30)
Glucose: 146 mg/dL — ABNORMAL HIGH (ref 65–99)
Osmolality: 272 (ref 275–301)
POTASSIUM: 3.7 mmol/L (ref 3.5–5.1)
SGPT (ALT): 28 U/L (ref 12–78)
Sodium: 135 mmol/L — ABNORMAL LOW (ref 136–145)
TOTAL PROTEIN: 7.2 g/dL (ref 6.4–8.2)

## 2013-03-20 LAB — CK TOTAL AND CKMB (NOT AT ARMC)
CK, TOTAL: 76 U/L
CK, Total: 81 U/L
CK, Total: 107 U/L
CK-MB: 0.6 ng/mL (ref 0.5–3.6)
CK-MB: 0.7 ng/mL (ref 0.5–3.6)
CK-MB: 1 ng/mL (ref 0.5–3.6)

## 2013-03-20 LAB — TROPONIN I
TROPONIN-I: 0.09 ng/mL — AB
TROPONIN-I: 0.06 ng/mL — AB
Troponin-I: <0.02 ng/mL

## 2013-03-20 LAB — CBC
HCT: 38.6 % (ref 35.0–47.0)
HGB: 13.6 g/dL (ref 12.0–16.0)
MCH: 32.3 pg (ref 26.0–34.0)
MCHC: 35.2 g/dL (ref 32.0–36.0)
MCV: 92 fL (ref 80–100)
Platelet: 216 10*3/uL (ref 150–440)
RBC: 4.21 10*6/uL (ref 3.80–5.20)
RDW: 13.2 % (ref 11.5–14.5)
WBC: 9.4 10*3/uL (ref 3.6–11.0)

## 2013-03-20 LAB — RAPID INFLUENZA A&B ANTIGENS (ARMC ONLY)

## 2013-03-20 LAB — PRO B NATRIURETIC PEPTIDE: B-Type Natriuretic Peptide: 926 pg/mL — ABNORMAL HIGH (ref 0–450)

## 2013-03-21 DIAGNOSIS — I2510 Atherosclerotic heart disease of native coronary artery without angina pectoris: Secondary | ICD-10-CM | POA: Diagnosis not present

## 2013-03-21 DIAGNOSIS — I10 Essential (primary) hypertension: Secondary | ICD-10-CM | POA: Diagnosis not present

## 2013-03-21 DIAGNOSIS — R079 Chest pain, unspecified: Secondary | ICD-10-CM | POA: Diagnosis not present

## 2013-03-21 DIAGNOSIS — R748 Abnormal levels of other serum enzymes: Secondary | ICD-10-CM | POA: Diagnosis not present

## 2013-03-21 DIAGNOSIS — J069 Acute upper respiratory infection, unspecified: Secondary | ICD-10-CM | POA: Diagnosis not present

## 2013-03-21 LAB — CBC WITH DIFFERENTIAL/PLATELET
BASOS PCT: 0 %
Basophil #: 0 10*3/uL (ref 0.0–0.1)
EOS ABS: 0 10*3/uL (ref 0.0–0.7)
EOS PCT: 0 %
HCT: 33.9 % — ABNORMAL LOW (ref 35.0–47.0)
HGB: 11.7 g/dL — AB (ref 12.0–16.0)
LYMPHS ABS: 0.9 10*3/uL — AB (ref 1.0–3.6)
Lymphocyte %: 18.5 %
MCH: 31.7 pg (ref 26.0–34.0)
MCHC: 34.6 g/dL (ref 32.0–36.0)
MCV: 92 fL (ref 80–100)
MONO ABS: 0.5 x10 3/mm (ref 0.2–0.9)
MONOS PCT: 10.6 %
NEUTROS PCT: 70.9 %
Neutrophil #: 3.5 10*3/uL (ref 1.4–6.5)
PLATELETS: 222 10*3/uL (ref 150–440)
RBC: 3.7 10*6/uL — ABNORMAL LOW (ref 3.80–5.20)
RDW: 13 % (ref 11.5–14.5)
WBC: 4.9 10*3/uL (ref 3.6–11.0)

## 2013-03-21 LAB — BASIC METABOLIC PANEL
Anion Gap: 4 — ABNORMAL LOW (ref 7–16)
BUN: 16 mg/dL (ref 7–18)
CREATININE: 0.85 mg/dL (ref 0.60–1.30)
Calcium, Total: 9.2 mg/dL (ref 8.5–10.1)
Chloride: 110 mmol/L — ABNORMAL HIGH (ref 98–107)
Co2: 27 mmol/L (ref 21–32)
GLUCOSE: 104 mg/dL — AB (ref 65–99)
Osmolality: 283 (ref 275–301)
Potassium: 4 mmol/L (ref 3.5–5.1)
Sodium: 141 mmol/L (ref 136–145)

## 2013-03-27 DIAGNOSIS — K921 Melena: Secondary | ICD-10-CM | POA: Diagnosis not present

## 2013-03-27 DIAGNOSIS — J209 Acute bronchitis, unspecified: Secondary | ICD-10-CM | POA: Diagnosis not present

## 2013-04-04 ENCOUNTER — Emergency Department: Payer: Self-pay | Admitting: Emergency Medicine

## 2013-04-04 DIAGNOSIS — R040 Epistaxis: Secondary | ICD-10-CM | POA: Diagnosis not present

## 2013-04-04 DIAGNOSIS — I10 Essential (primary) hypertension: Secondary | ICD-10-CM | POA: Diagnosis not present

## 2013-04-04 DIAGNOSIS — R1110 Vomiting, unspecified: Secondary | ICD-10-CM | POA: Diagnosis not present

## 2013-04-04 DIAGNOSIS — R42 Dizziness and giddiness: Secondary | ICD-10-CM | POA: Diagnosis not present

## 2013-04-04 DIAGNOSIS — Z7982 Long term (current) use of aspirin: Secondary | ICD-10-CM | POA: Diagnosis not present

## 2013-04-04 LAB — CBC WITH DIFFERENTIAL/PLATELET
Basophil #: 0.1 10*3/uL (ref 0.0–0.1)
Basophil %: 0.9 %
EOS PCT: 1.1 %
Eosinophil #: 0.2 10*3/uL (ref 0.0–0.7)
HCT: 37 % (ref 35.0–47.0)
HGB: 12.6 g/dL (ref 12.0–16.0)
LYMPHS ABS: 2.6 10*3/uL (ref 1.0–3.6)
Lymphocyte %: 17.1 %
MCH: 31.3 pg (ref 26.0–34.0)
MCHC: 33.9 g/dL (ref 32.0–36.0)
MCV: 92 fL (ref 80–100)
MONOS PCT: 5.9 %
Monocyte #: 0.9 x10 3/mm (ref 0.2–0.9)
NEUTROS ABS: 11.3 10*3/uL — AB (ref 1.4–6.5)
NEUTROS PCT: 75 %
PLATELETS: 424 10*3/uL (ref 150–440)
RBC: 4.01 10*6/uL (ref 3.80–5.20)
RDW: 13.3 % (ref 11.5–14.5)
WBC: 15 10*3/uL — ABNORMAL HIGH (ref 3.6–11.0)

## 2013-04-04 LAB — BASIC METABOLIC PANEL
Anion Gap: 10 (ref 7–16)
BUN: 24 mg/dL — ABNORMAL HIGH (ref 7–18)
CHLORIDE: 103 mmol/L (ref 98–107)
CO2: 25 mmol/L (ref 21–32)
CREATININE: 1.44 mg/dL — AB (ref 0.60–1.30)
Calcium, Total: 9 mg/dL (ref 8.5–10.1)
GFR CALC AF AMER: 39 — AB
GFR CALC NON AF AMER: 34 — AB
GLUCOSE: 182 mg/dL — AB (ref 65–99)
Osmolality: 284 (ref 275–301)
Potassium: 4.3 mmol/L (ref 3.5–5.1)
Sodium: 138 mmol/L (ref 136–145)

## 2013-05-08 DIAGNOSIS — IMO0001 Reserved for inherently not codable concepts without codable children: Secondary | ICD-10-CM | POA: Diagnosis not present

## 2013-06-18 DIAGNOSIS — M542 Cervicalgia: Secondary | ICD-10-CM | POA: Diagnosis not present

## 2013-06-18 DIAGNOSIS — M25519 Pain in unspecified shoulder: Secondary | ICD-10-CM | POA: Diagnosis not present

## 2013-07-09 DIAGNOSIS — M25519 Pain in unspecified shoulder: Secondary | ICD-10-CM | POA: Diagnosis not present

## 2013-07-09 DIAGNOSIS — R5381 Other malaise: Secondary | ICD-10-CM | POA: Diagnosis not present

## 2013-07-09 DIAGNOSIS — R5383 Other fatigue: Secondary | ICD-10-CM | POA: Diagnosis not present

## 2013-07-17 ENCOUNTER — Ambulatory Visit: Payer: Self-pay | Admitting: Rheumatology

## 2013-07-17 DIAGNOSIS — M25519 Pain in unspecified shoulder: Secondary | ICD-10-CM | POA: Diagnosis not present

## 2013-07-17 DIAGNOSIS — R5381 Other malaise: Secondary | ICD-10-CM | POA: Diagnosis not present

## 2013-07-17 DIAGNOSIS — R5383 Other fatigue: Secondary | ICD-10-CM | POA: Diagnosis not present

## 2013-07-17 DIAGNOSIS — M6688 Spontaneous rupture of other tendons, other: Secondary | ICD-10-CM | POA: Diagnosis not present

## 2013-08-26 DIAGNOSIS — M67919 Unspecified disorder of synovium and tendon, unspecified shoulder: Secondary | ICD-10-CM | POA: Diagnosis not present

## 2013-08-26 DIAGNOSIS — M719 Bursopathy, unspecified: Secondary | ICD-10-CM | POA: Diagnosis not present

## 2013-10-25 DIAGNOSIS — Z23 Encounter for immunization: Secondary | ICD-10-CM | POA: Diagnosis not present

## 2013-11-18 DIAGNOSIS — D485 Neoplasm of uncertain behavior of skin: Secondary | ICD-10-CM | POA: Diagnosis not present

## 2013-11-18 DIAGNOSIS — L821 Other seborrheic keratosis: Secondary | ICD-10-CM | POA: Diagnosis not present

## 2013-11-18 DIAGNOSIS — C44319 Basal cell carcinoma of skin of other parts of face: Secondary | ICD-10-CM | POA: Diagnosis not present

## 2013-12-23 DIAGNOSIS — C44319 Basal cell carcinoma of skin of other parts of face: Secondary | ICD-10-CM | POA: Diagnosis not present

## 2013-12-24 DIAGNOSIS — T148 Other injury of unspecified body region: Secondary | ICD-10-CM | POA: Diagnosis not present

## 2013-12-30 DIAGNOSIS — L821 Other seborrheic keratosis: Secondary | ICD-10-CM | POA: Diagnosis not present

## 2013-12-30 DIAGNOSIS — T148 Other injury of unspecified body region: Secondary | ICD-10-CM | POA: Diagnosis not present

## 2014-02-20 DIAGNOSIS — J188 Other pneumonia, unspecified organism: Secondary | ICD-10-CM | POA: Diagnosis not present

## 2014-03-03 DIAGNOSIS — M5432 Sciatica, left side: Secondary | ICD-10-CM | POA: Diagnosis not present

## 2014-03-03 DIAGNOSIS — R062 Wheezing: Secondary | ICD-10-CM | POA: Diagnosis not present

## 2014-03-05 DIAGNOSIS — I2510 Atherosclerotic heart disease of native coronary artery without angina pectoris: Secondary | ICD-10-CM | POA: Diagnosis not present

## 2014-03-05 DIAGNOSIS — M6281 Muscle weakness (generalized): Secondary | ICD-10-CM | POA: Diagnosis not present

## 2014-03-05 DIAGNOSIS — M5430 Sciatica, unspecified side: Secondary | ICD-10-CM | POA: Diagnosis not present

## 2014-03-05 DIAGNOSIS — I69398 Other sequelae of cerebral infarction: Secondary | ICD-10-CM | POA: Diagnosis not present

## 2014-03-10 DIAGNOSIS — I69398 Other sequelae of cerebral infarction: Secondary | ICD-10-CM | POA: Diagnosis not present

## 2014-03-10 DIAGNOSIS — I2510 Atherosclerotic heart disease of native coronary artery without angina pectoris: Secondary | ICD-10-CM | POA: Diagnosis not present

## 2014-03-10 DIAGNOSIS — M5430 Sciatica, unspecified side: Secondary | ICD-10-CM | POA: Diagnosis not present

## 2014-03-10 DIAGNOSIS — M6281 Muscle weakness (generalized): Secondary | ICD-10-CM | POA: Diagnosis not present

## 2014-03-11 DIAGNOSIS — M6281 Muscle weakness (generalized): Secondary | ICD-10-CM | POA: Diagnosis not present

## 2014-03-11 DIAGNOSIS — I69398 Other sequelae of cerebral infarction: Secondary | ICD-10-CM | POA: Diagnosis not present

## 2014-03-11 DIAGNOSIS — M5430 Sciatica, unspecified side: Secondary | ICD-10-CM | POA: Diagnosis not present

## 2014-03-11 DIAGNOSIS — I2510 Atherosclerotic heart disease of native coronary artery without angina pectoris: Secondary | ICD-10-CM | POA: Diagnosis not present

## 2014-03-13 DIAGNOSIS — M5430 Sciatica, unspecified side: Secondary | ICD-10-CM | POA: Diagnosis not present

## 2014-03-13 DIAGNOSIS — I2510 Atherosclerotic heart disease of native coronary artery without angina pectoris: Secondary | ICD-10-CM | POA: Diagnosis not present

## 2014-03-13 DIAGNOSIS — M6281 Muscle weakness (generalized): Secondary | ICD-10-CM | POA: Diagnosis not present

## 2014-03-13 DIAGNOSIS — I69398 Other sequelae of cerebral infarction: Secondary | ICD-10-CM | POA: Diagnosis not present

## 2014-03-18 DIAGNOSIS — M6281 Muscle weakness (generalized): Secondary | ICD-10-CM | POA: Diagnosis not present

## 2014-03-18 DIAGNOSIS — M5430 Sciatica, unspecified side: Secondary | ICD-10-CM | POA: Diagnosis not present

## 2014-03-18 DIAGNOSIS — I69398 Other sequelae of cerebral infarction: Secondary | ICD-10-CM | POA: Diagnosis not present

## 2014-03-18 DIAGNOSIS — I2510 Atherosclerotic heart disease of native coronary artery without angina pectoris: Secondary | ICD-10-CM | POA: Diagnosis not present

## 2014-03-20 DIAGNOSIS — M5430 Sciatica, unspecified side: Secondary | ICD-10-CM | POA: Diagnosis not present

## 2014-03-20 DIAGNOSIS — I2510 Atherosclerotic heart disease of native coronary artery without angina pectoris: Secondary | ICD-10-CM | POA: Diagnosis not present

## 2014-03-20 DIAGNOSIS — M6281 Muscle weakness (generalized): Secondary | ICD-10-CM | POA: Diagnosis not present

## 2014-03-20 DIAGNOSIS — I69398 Other sequelae of cerebral infarction: Secondary | ICD-10-CM | POA: Diagnosis not present

## 2014-03-21 DIAGNOSIS — I69398 Other sequelae of cerebral infarction: Secondary | ICD-10-CM | POA: Diagnosis not present

## 2014-03-21 DIAGNOSIS — M5430 Sciatica, unspecified side: Secondary | ICD-10-CM | POA: Diagnosis not present

## 2014-03-21 DIAGNOSIS — M6281 Muscle weakness (generalized): Secondary | ICD-10-CM | POA: Diagnosis not present

## 2014-03-21 DIAGNOSIS — I2510 Atherosclerotic heart disease of native coronary artery without angina pectoris: Secondary | ICD-10-CM | POA: Diagnosis not present

## 2014-03-24 DIAGNOSIS — I2510 Atherosclerotic heart disease of native coronary artery without angina pectoris: Secondary | ICD-10-CM | POA: Diagnosis not present

## 2014-03-24 DIAGNOSIS — M6281 Muscle weakness (generalized): Secondary | ICD-10-CM | POA: Diagnosis not present

## 2014-03-24 DIAGNOSIS — I69398 Other sequelae of cerebral infarction: Secondary | ICD-10-CM | POA: Diagnosis not present

## 2014-03-24 DIAGNOSIS — M5430 Sciatica, unspecified side: Secondary | ICD-10-CM | POA: Diagnosis not present

## 2014-05-24 NOTE — Discharge Summary (Signed)
PATIENT NAME:  Samantha [name], Samantha [name] MR#:  [id number] DATE OF BIRTH:  Jan 08, 1929  DATE OF ADMISSION:  03/20/2013 DATE OF DISCHARGE:  03/21/2013  PRIMARY CARE PROVIDER:  Enid Derry   DISCHARGE DIAGNOSES: 1.  Acute bronchitis.  2.  Elevated troponin secondary to demand ischemia.  3.  Coronary artery disease.  4.  Hypertension.  5.  Hyperlipidemia.  Chest x-ray showed some bronchitis.  CT of the head showed no acute abnormalities.   ADMITTING HISTORY AND PHYSICAL AND HOSPITAL COURSE:  Please see detailed H and P dictated previously. In brief, an 81 year old female patient who presented to the hospital complaining of upper respiratory infection symptoms along with some chest congestion, was thought to have acute bronchitis but secondary to her elevated troponin was admitted to the hospitalist service. The patient was seen by cardiology who suggested this is demand ischemia, did not need any further work-up. The patient was started on Levaquin, steroids, breathing treatments which she has improved well, feels back to baseline and is being discharged back home.   Prior to discharge, the patient does not have any wheezing, has ambulated well in the hallway.   DISCHARGE MEDICATIONS:  1.  Fenofibrate 54 mg oral once a day.  2.  Nitroglycerin 0.3 sublingual every 5 minutes as needed for chest pain.  3.  Atorvastatin 80 mg daily.  4.  Percocet 5/325 one tablet every 6 hours as needed for pain.  5.  Omeprazole 20 mg daily.  6.  Hydrochlorothiazide/lisinopril 25/20 mg 1 tablet daily.  7.  Aspirin 81 mg daily.  8.  Fluoxetine 30 mg daily.  9.  Isosorbide mononitrate 60 mg oral daily.  10.  Metoprolol succinate 100 mg oral daily.  11.  Plavix 75 mg daily.  12.  Levaquin 5 mg oral once a day for 5 days.  13.  ProAir HFA 2 puffs inhaled 4 times a day as needed for shortness of breath. 14.  Prednisone 60 mg tapered over 6 days.   DISCHARGE INSTRUCTIONS: Low-sodium diet. Activity as tolerated. Follow up  with primary care physician in 1 to 2 weeks.   TIME SPENT ON DAY OF DISCHARGE IN DISCHARGE ACTIVITY:  35 minutes     ____________________________ Leia Alf. Avis Mcmahill, MD srs:ce D: 03/22/2013 16:13:18 ET T: 03/22/2013 17:15:45 ET JOB#: [id number]  cc: Alveta Heimlich R. Darvin Neighbours, MD, <Dictator> Enid Derry, MD Neita Carp MD ELECTRONICALLY SIGNED 04/04/2013 18:41

## 2014-05-24 NOTE — H&P (Signed)
PATIENT NAME:  Samantha [name], Samantha [name] MR#:  [id number] DATE OF BIRTH:  1928/09/13  DATE OF ADMISSION:  03/20/2013  REFERRING PHYSICIAN: Loney Hering, MD  PRIMARY CARE PHYSICIAN: Enid Derry, MD  CHIEF COMPLAINT: Shortness of breath, cough, fever.   HISTORY OF PRESENT ILLNESS: This is an 81 year old female with significant past medical history of coronary artery disease, CHF, hypertension, hyperlipidemia, who presents with above-mentioned complaints. The patient had fever by EMS of 100.9. Her chest x-ray did not show any opacity or infiltrate. Reports her shortness of breath started yesterday, as well reports some sweating, fever, cough, nonproductive, as well dizziness, heart racing. As well, the patient reports occasional chest pain, intermittent over the last week on and off, without relieving or provoking factor, currently denies any chest pain, was reproducible on palpation. The patient's first troponin was negative, repeat troponin was 0.09. She was ordered 324 mg of p.o. aspirin. Hospitalist service was requested to admit the patient for further evaluation of her chest pain. Her flu antigen test is still pending.   PAST MEDICAL HISTORY:  1. Hyperlipidemia.  2. Coronary artery disease.  3. Depression.  4. Hypertension.  5. Gastritis.  6. Mastoiditis.   PAST SURGICAL HISTORY:  1. Stent placement.  2. Mastoiditis.  3. Appendectomy.   ALLERGIES: No known drug allergies.   FAMILY HISTORY: Significant for diabetes.   SOCIAL HISTORY: No tobacco. No alcohol. No drug use.   HOME MEDICATIONS:  1. Aspirin 81 mg oral daily.  2. Plavix 75 mg oral daily.  3. Fenofibrate 54 mg oral daily.  4. Percocet as needed.  5. Imdur 60 mg oral daily.  6. Sublingual nitroglycerin as needed.  7. Fluoxetine 30 mg oral daily.  9. Hydrochlorothiazide/lisinopril 25/20 mg oral daily.  10. Metoprolol succinate 100 mg oral daily.  11. Omeprazole 20 mg oral daily.  12. B complex 1 tablet oral daily.    REVIEW OF SYSTEMS:  CONSTITUTIONAL: Reports fever, chills, fatigue, weakness, poor appetite.  EYES: Denies blurry vision, double vision, inflammation, glaucoma.  ENT: Denies tinnitus, ear pain, hearing loss.  RESPIRATORY: Complains of cough, shortness of breath. Denies any productive sputum, any COPD.  CARDIOVASCULAR: Reports chest pain occasional. Denies edema or syncope.  GASTROINTESTINAL: Denies any nausea, vomiting, diarrhea, abdominal pain.  GENITOURINARY: Denies dysuria, hematuria, renal colic.  ENDOCRINE: Denies polyuria, polydipsia, heat or cold intolerance.  HEMATOLOGY: Denies anemia, easy bruising, bleeding diathesis.  INTEGUMENTARY: Denies acne, rash or skin lesion.  MUSCULOSKELETAL: Denies any swelling, gout or cramps.  NEUROLOGIC: Denies CVA, TIA, ataxia, vertigo, tremors.  PSYCHIATRIC: Denies anxiety, insomnia or bipolar disorder.   PHYSICAL EXAMINATION:  VITAL SIGNS: Temperature 98.3, pulse 98, respiratory rate 20, blood pressure 99/59, saturating 95% on oxygen.  GENERAL: An elderly female, lying in bed, in no apparent distress.  HEENT: Head atraumatic, normocephalic. Pupils are equally reactive to light. Pink conjunctivae. Anicteric sclerae. Dry oral mucosa. Has erythema at the back of her throat.  NECK: Supple. No thyromegaly. No JVD.  CHEST: Good air entry bilaterally. No wheezing, rales, rhonchi.  CARDIOVASCULAR: S1, S2 heard. No rubs, murmurs or gallops.  ABDOMEN: Soft, nontender, nondistended. Bowel sounds present.  EXTREMITIES: No edema. No clubbing. No cyanosis. Pedal pulses felt bilaterally.  PSYCHIATRIC: Appropriate affect. Awake, alert x3. Intact judgment and insight.  NEUROLOGIC: Cranial nerves grossly intact. Motor 5 out of 5. No focal deficits.  SKIN: Dry with delayed skin turgor.  MUSCULOSKELETAL: No joint effusion or erythema.   PERTINENT LABORATORY DATA: BNP 926. Glucose 146, BUN 10,  creatinine 0.84, sodium 135, potassium 3.7, chloride 102, CO2 24.  Troponin: First one less than 0.02, repeat 0.09. White blood cells 9.4, hemoglobin 13.6, hematocrit 38.6, platelets 216. Urinalysis showing negative leukocyte esterase and negative nitrite.   IMAGING: CT head without contrast showing no acute intracranial abnormality. Chest x-ray, portable, showing no active disease.   ASSESSMENT AND PLAN:  1. Chest pain. It is intermittent, reproducible by palpation, has musculoskeletal quality, but given the fact her repeat troponin was borderline elevated, as well her known history of coronary artery disease, the patient will be admitted for further evaluation. Will admit to telemetry. Will continue to cycle cardiac enzymes and follow the trend. Will give 324 mg of aspirin x1. She is on sublingual nitroglycerin as needed. She is on Imdur. She is on statin. She is on beta blocker. She is on an ACE inhibitor. Discussed with Dr. Saralyn Pilar, who will see the patient today. No anticoagulation is indicated at this time.  2. Upper respiratory infection. As well, will need to rule out flu, flu antigen still pending. Meanwhile, will start the patient on levofloxacin for upper respiratory infection.  3. Coronary artery disease. Will continue the patient on aspirin and Plavix, statin, Imdur and lisinopril.  4. Hypertension. Blood pressure on the lower side. Will continue only with metoprolol. Will hold hydrochlorothiazide and lisinopril.  5. Hyperlipidemia. Will continue with statin and fenofibrate.  6. History of depression. Continue with home medication.  7. Deep vein thrombosis prophylaxis. Subcutaneous heparin.   CODE STATUS: Discussed with the patient. She wishes to be full code.   TOTAL TIME SPENT ON ADMISSION AND PATIENT CARE: 50 minutes.    ____________________________ Albertine Patricia, MD dse:lb D: 03/20/2013 07:51:26 ET T: 03/20/2013 08:08:34 ET JOB#: [id number]  cc: Albertine Patricia, MD, <Dictator> Jozelynn Danielson Graciela Husbands MD ELECTRONICALLY SIGNED 03/28/2013  0:45

## 2014-05-24 NOTE — Consult Note (Signed)
PATIENT NAME:  OSHA, Samantha [name] MR#:  [id number] DATE OF BIRTH:  1928-04-22  DATE OF CONSULTATION:  03/20/2013  REFERRING PHYSICIAN: Dr. Waldron Labs   CONSULTING PHYSICIAN:  Isaias Cowman, MD  PRIMARY CARE PHYSICIAN: Dr. Enid Derry  CARDIOLOGIST: Myself.   CHIEF COMPLAINT: Cough, fever and shortness of breath.   REASON FOR CONSULTATION: Consultation requested for evaluation of borderline elevated troponin.   HISTORY OF PRESENT ILLNESS: The patient is an 81 year old female with known history of coronary artery disease, status post prior coronary stents. The patient apparently was in her usual state of health until she has been experiencing shortness of breath, fever and chills, diaphoresis, nonproductive cough and difficulty swallowing. The patient reports that she has been well from a cardiovascular perspective with mild intermittent episodes of chest discomfort. The patient presented to [facility] Emergency Room via EMS. Chest x-ray was nondiagnostic. Second troponin was borderline elevated to 0.09 in the absence of chest pain.   PAST MEDICAL HISTORY:  1.  Coronary artery disease, status post coronary stents at [facility] in 2007 followed by coronary stents at [facility] 6 months later.  2.  Hyperlipidemia.  3.  Hypertension.  4.  Diabetes.  5.  History of gastritis.   MEDICATIONS: Aspirin 81 mg daily, Plavix 75 mg daily, fenofibrate 54 mg daily, Imdur 60 mg daily, hydrochlorothiazide/lisinopril 25/20, 1 daily, metoprolol succinate 100 mg daily, Percocet p.r.n., sublingual nitroglycerin p.r.n., fluoxetine 30 mg daily, omeprazole 20 mg daily and B complex 1 daily.   SOCIAL HISTORY: The patient currently lives alone. She denies tobacco or EtOH abuse.   FAMILY HISTORY: No immediate family history of coronary artery disease or myocardial infarction.   REVIEW OF SYSTEMS: CONSTITUTIONAL: The patient has had fever and chills with diaphoresis. EYES: No blurry vision. EARS: No hearing loss.  RESPIRATORY: The patient has shortness of breath, nonproductive cough. CARDIOVASCULAR: The patient has mild intermittent chest pain, currently chest pain free. GASTROINTESTINAL: The patient denies nausea, vomiting, diarrhea, or constipation. GENITOURINARY: No dysuria or hematuria. ENDOCRINE: No polyuria or polydipsia. MUSCULOSKELETAL: No arthralgias or myalgias.  NEUROLOGICAL: No focal muscle weakness or numbness. PSYCHOLOGICAL: No depression or anxiety.   PHYSICAL EXAMINATION:  VITAL SIGNS: Blood pressure of 100/60, pulse 90, respirations 20, temperature 98.3, pulse oximetry 95%.  HEENT: Pupils equal, reactive to light and accommodation.  NECK: Supple without thyromegaly.  LUNGS: Clear.  HEART: Normal JVP. Normal PMI. Regular rate and rhythm. Normal S1, S2. No appreciable gallop, murmur, or rub.  ABDOMEN: Soft and nontender. Pulses were intact bilaterally.  MUSCULOSKELETAL: Normal tone.  NEUROLOGIC: The patient is alert and oriented x 3. Motor and sensory both grossly intact.   IMPRESSION: An 81 year old female with probable bronchitis, possible early pneumonia, who presents with history of intermittent mild chest pain and borderline elevated troponin, likely due to demand supply ischemia and not due to acute coronary syndrome. The patient currently is chest pain free.   RECOMMENDATIONS:  1.  I agree with overall current therapy.  2.  Would defer full dose anticoagulation.  3.  Would defer further cardiac noninvasive or invasive evaluation at this time.  ____________________________ Isaias Cowman, MD ap:aw D: 03/20/2013 09:05:10 ET T: 03/20/2013 09:16:30 ET JOB#: [id number]  cc: Isaias Cowman, MD, <Dictator> Isaias Cowman MD ELECTRONICALLY SIGNED 04/05/2013 13:15

## 2014-05-26 DIAGNOSIS — E538 Deficiency of other specified B group vitamins: Secondary | ICD-10-CM | POA: Diagnosis not present

## 2014-05-26 DIAGNOSIS — I639 Cerebral infarction, unspecified: Secondary | ICD-10-CM | POA: Diagnosis not present

## 2014-05-26 DIAGNOSIS — M791 Myalgia: Secondary | ICD-10-CM | POA: Diagnosis not present

## 2014-05-26 DIAGNOSIS — E785 Hyperlipidemia, unspecified: Secondary | ICD-10-CM | POA: Diagnosis not present

## 2014-05-26 DIAGNOSIS — M810 Age-related osteoporosis without current pathological fracture: Secondary | ICD-10-CM | POA: Diagnosis not present

## 2014-05-26 DIAGNOSIS — M4806 Spinal stenosis, lumbar region: Secondary | ICD-10-CM | POA: Diagnosis not present

## 2014-05-26 DIAGNOSIS — I10 Essential (primary) hypertension: Secondary | ICD-10-CM | POA: Diagnosis not present

## 2014-05-26 DIAGNOSIS — N183 Chronic kidney disease, stage 3 (moderate): Secondary | ICD-10-CM | POA: Diagnosis not present

## 2014-05-27 ENCOUNTER — Other Ambulatory Visit: Payer: Self-pay | Admitting: Family Medicine

## 2014-05-27 DIAGNOSIS — M48061 Spinal stenosis, lumbar region without neurogenic claudication: Secondary | ICD-10-CM

## 2014-06-05 ENCOUNTER — Ambulatory Visit: Admission: RE | Admit: 2014-06-05 | Payer: Self-pay | Source: Ambulatory Visit

## 2014-06-05 ENCOUNTER — Ambulatory Visit
Admission: RE | Admit: 2014-06-05 | Discharge: 2014-06-05 | Disposition: A | Discharge status: Home / Self Care | Payer: Medicare Other | Source: Ambulatory Visit | Attending: Family Medicine | Admitting: Family Medicine

## 2014-06-05 DIAGNOSIS — I209 Angina pectoris, unspecified: Secondary | ICD-10-CM | POA: Diagnosis not present

## 2014-06-05 DIAGNOSIS — M47816 Spondylosis without myelopathy or radiculopathy, lumbar region: Secondary | ICD-10-CM

## 2014-06-05 DIAGNOSIS — Z7982 Long term (current) use of aspirin: Secondary | ICD-10-CM

## 2014-06-05 DIAGNOSIS — I2510 Atherosclerotic heart disease of native coronary artery without angina pectoris: Secondary | ICD-10-CM | POA: Diagnosis present

## 2014-06-05 DIAGNOSIS — M8580 Other specified disorders of bone density and structure, unspecified site: Secondary | ICD-10-CM | POA: Diagnosis present

## 2014-06-05 DIAGNOSIS — E876 Hypokalemia: Secondary | ICD-10-CM | POA: Diagnosis present

## 2014-06-05 DIAGNOSIS — E782 Mixed hyperlipidemia: Secondary | ICD-10-CM | POA: Diagnosis not present

## 2014-06-05 DIAGNOSIS — Z9049 Acquired absence of other specified parts of digestive tract: Secondary | ICD-10-CM | POA: Diagnosis present

## 2014-06-05 DIAGNOSIS — Z883 Allergy status to other anti-infective agents status: Secondary | ICD-10-CM

## 2014-06-05 DIAGNOSIS — I4891 Unspecified atrial fibrillation: Secondary | ICD-10-CM | POA: Diagnosis not present

## 2014-06-05 DIAGNOSIS — I248 Other forms of acute ischemic heart disease: Secondary | ICD-10-CM | POA: Diagnosis not present

## 2014-06-05 DIAGNOSIS — M5126 Other intervertebral disc displacement, lumbar region: Secondary | ICD-10-CM

## 2014-06-05 DIAGNOSIS — Z90710 Acquired absence of both cervix and uterus: Secondary | ICD-10-CM

## 2014-06-05 DIAGNOSIS — M48061 Spinal stenosis, lumbar region without neurogenic claudication: Secondary | ICD-10-CM

## 2014-06-05 DIAGNOSIS — I739 Peripheral vascular disease, unspecified: Secondary | ICD-10-CM | POA: Diagnosis present

## 2014-06-05 DIAGNOSIS — M1288 Other specific arthropathies, not elsewhere classified, other specified site: Secondary | ICD-10-CM | POA: Insufficient documentation

## 2014-06-05 DIAGNOSIS — E860 Dehydration: Secondary | ICD-10-CM | POA: Diagnosis present

## 2014-06-05 DIAGNOSIS — Z8673 Personal history of transient ischemic attack (TIA), and cerebral infarction without residual deficits: Secondary | ICD-10-CM

## 2014-06-05 DIAGNOSIS — R0600 Dyspnea, unspecified: Secondary | ICD-10-CM | POA: Diagnosis not present

## 2014-06-05 DIAGNOSIS — I10 Essential (primary) hypertension: Secondary | ICD-10-CM | POA: Diagnosis not present

## 2014-06-05 DIAGNOSIS — R000 Tachycardia, unspecified: Secondary | ICD-10-CM | POA: Diagnosis not present

## 2014-06-05 DIAGNOSIS — Z79899 Other long term (current) drug therapy: Secondary | ICD-10-CM

## 2014-06-05 DIAGNOSIS — I472 Ventricular tachycardia: Secondary | ICD-10-CM | POA: Diagnosis present

## 2014-06-05 DIAGNOSIS — Z955 Presence of coronary angioplasty implant and graft: Secondary | ICD-10-CM

## 2014-06-05 DIAGNOSIS — Z7951 Long term (current) use of inhaled steroids: Secondary | ICD-10-CM

## 2014-06-07 ENCOUNTER — Emergency Department: Payer: Medicare Other

## 2014-06-07 ENCOUNTER — Inpatient Hospital Stay
Admission: EM | Admit: 2014-06-07 | Discharge: 2014-06-08 | DRG: 309 | Disposition: A | Discharge status: Home / Self Care | Payer: Medicare Other | Attending: Internal Medicine | Admitting: Internal Medicine

## 2014-06-07 ENCOUNTER — Encounter: Payer: Self-pay | Admitting: *Deleted

## 2014-06-07 DIAGNOSIS — I209 Angina pectoris, unspecified: Secondary | ICD-10-CM | POA: Diagnosis not present

## 2014-06-07 DIAGNOSIS — M8580 Other specified disorders of bone density and structure, unspecified site: Secondary | ICD-10-CM | POA: Diagnosis present

## 2014-06-07 DIAGNOSIS — Z7951 Long term (current) use of inhaled steroids: Secondary | ICD-10-CM | POA: Diagnosis not present

## 2014-06-07 DIAGNOSIS — I639 Cerebral infarction, unspecified: Secondary | ICD-10-CM | POA: Diagnosis present

## 2014-06-07 DIAGNOSIS — Z883 Allergy status to other anti-infective agents status: Secondary | ICD-10-CM | POA: Diagnosis not present

## 2014-06-07 DIAGNOSIS — I4891 Unspecified atrial fibrillation: Secondary | ICD-10-CM | POA: Diagnosis present

## 2014-06-07 DIAGNOSIS — Z8673 Personal history of transient ischemic attack (TIA), and cerebral infarction without residual deficits: Secondary | ICD-10-CM | POA: Diagnosis not present

## 2014-06-07 DIAGNOSIS — I480 Paroxysmal atrial fibrillation: Secondary | ICD-10-CM | POA: Diagnosis not present

## 2014-06-07 DIAGNOSIS — I10 Essential (primary) hypertension: Secondary | ICD-10-CM | POA: Diagnosis present

## 2014-06-07 DIAGNOSIS — I2510 Atherosclerotic heart disease of native coronary artery without angina pectoris: Secondary | ICD-10-CM

## 2014-06-07 DIAGNOSIS — I248 Other forms of acute ischemic heart disease: Secondary | ICD-10-CM | POA: Diagnosis present

## 2014-06-07 DIAGNOSIS — Z79899 Other long term (current) drug therapy: Secondary | ICD-10-CM | POA: Diagnosis not present

## 2014-06-07 DIAGNOSIS — R7989 Other specified abnormal findings of blood chemistry: Secondary | ICD-10-CM | POA: Diagnosis not present

## 2014-06-07 DIAGNOSIS — E876 Hypokalemia: Secondary | ICD-10-CM | POA: Diagnosis present

## 2014-06-07 DIAGNOSIS — R000 Tachycardia, unspecified: Secondary | ICD-10-CM | POA: Diagnosis not present

## 2014-06-07 DIAGNOSIS — Z955 Presence of coronary angioplasty implant and graft: Secondary | ICD-10-CM | POA: Diagnosis not present

## 2014-06-07 DIAGNOSIS — E860 Dehydration: Secondary | ICD-10-CM | POA: Diagnosis present

## 2014-06-07 DIAGNOSIS — Z7982 Long term (current) use of aspirin: Secondary | ICD-10-CM | POA: Diagnosis not present

## 2014-06-07 DIAGNOSIS — I472 Ventricular tachycardia: Secondary | ICD-10-CM | POA: Diagnosis present

## 2014-06-07 DIAGNOSIS — Z90710 Acquired absence of both cervix and uterus: Secondary | ICD-10-CM | POA: Diagnosis not present

## 2014-06-07 DIAGNOSIS — E782 Mixed hyperlipidemia: Secondary | ICD-10-CM | POA: Diagnosis present

## 2014-06-07 DIAGNOSIS — Z9049 Acquired absence of other specified parts of digestive tract: Secondary | ICD-10-CM | POA: Diagnosis present

## 2014-06-07 DIAGNOSIS — I739 Peripheral vascular disease, unspecified: Secondary | ICD-10-CM | POA: Diagnosis present

## 2014-06-07 DIAGNOSIS — R0600 Dyspnea, unspecified: Secondary | ICD-10-CM | POA: Diagnosis not present

## 2014-06-07 HISTORY — DX: Unspecified atrial fibrillation: I48.91

## 2014-06-07 HISTORY — DX: Cerebral infarction, unspecified: I63.9

## 2014-06-07 HISTORY — DX: Atherosclerotic heart disease of native coronary artery without angina pectoris: I25.10

## 2014-06-07 HISTORY — DX: Essential (primary) hypertension: I10

## 2014-06-07 LAB — COMPREHENSIVE METABOLIC PANEL
ALT: 17 U/L (ref 14–54)
AST: 28 U/L (ref 15–41)
Albumin: 3.7 g/dL (ref 3.5–5.0)
Alkaline Phosphatase: 78 U/L (ref 38–126)
Anion gap: 8 (ref 5–15)
BILIRUBIN TOTAL: 0.5 mg/dL (ref 0.3–1.2)
BUN: 25 mg/dL — ABNORMAL HIGH (ref 6–20)
CALCIUM: 9.5 mg/dL (ref 8.9–10.3)
CO2: 27 mmol/L (ref 22–32)
Chloride: 102 mmol/L (ref 101–111)
Creatinine, Ser: 1.17 mg/dL — ABNORMAL HIGH (ref 0.44–1.00)
GFR calc Af Amer: 49 mL/min — ABNORMAL LOW (ref >60)
GFR, EST NON AFRICAN AMERICAN: 42 mL/min — AB (ref >60)
Glucose, Bld: 136 mg/dL — ABNORMAL HIGH (ref 65–99)
Potassium: 3.3 mmol/L — ABNORMAL LOW (ref 3.5–5.1)
SODIUM: 137 mmol/L (ref 135–145)
TOTAL PROTEIN: 7.2 g/dL (ref 6.5–8.1)

## 2014-06-07 LAB — CBC WITH DIFFERENTIAL/PLATELET
BASOS ABS: 0 10*3/uL (ref 0–0.1)
BASOS PCT: 0 %
Eosinophils Absolute: 0.1 10*3/uL (ref 0–0.7)
Eosinophils Relative: 2 %
HCT: 43 % (ref 35.0–47.0)
HEMOGLOBIN: 14.4 g/dL (ref 12.0–16.0)
Lymphocytes Relative: 25 %
Lymphs Abs: 1.9 10*3/uL (ref 1.0–3.6)
MCH: 30.9 pg (ref 26.0–34.0)
MCHC: 33.5 g/dL (ref 32.0–36.0)
MCV: 92.2 fL (ref 80.0–100.0)
MONOS PCT: 10 %
Monocytes Absolute: 0.8 10*3/uL (ref 0.2–0.9)
NEUTROS ABS: 4.9 10*3/uL (ref 1.4–6.5)
NEUTROS PCT: 63 %
Platelets: 312 10*3/uL (ref 150–440)
RBC: 4.66 MIL/uL (ref 3.80–5.20)
RDW: 12.6 % (ref 11.5–14.5)
WBC: 7.8 10*3/uL (ref 3.6–11.0)

## 2014-06-07 LAB — URINALYSIS COMPLETE WITH MICROSCOPIC (ARMC ONLY)
BILIRUBIN URINE: NEGATIVE
GLUCOSE, UA: NEGATIVE mg/dL
Ketones, ur: NEGATIVE mg/dL
Leukocytes, UA: NEGATIVE
Nitrite: NEGATIVE
Protein, ur: NEGATIVE mg/dL
SPECIFIC GRAVITY, URINE: 1.011 (ref 1.005–1.030)
pH: 6 (ref 5.0–8.0)

## 2014-06-07 LAB — PROTIME-INR
INR: 0.93
Prothrombin Time: 12.7 seconds (ref 11.4–15.0)

## 2014-06-07 LAB — MAGNESIUM: MAGNESIUM: 1.7 mg/dL (ref 1.7–2.4)

## 2014-06-07 LAB — GLUCOSE, CAPILLARY: GLUCOSE-CAPILLARY: 97 mg/dL (ref 70–99)

## 2014-06-07 LAB — TROPONIN I: Troponin I: 0.04 ng/mL — ABNORMAL HIGH (ref <0.031)

## 2014-06-07 LAB — APTT: aPTT: 27 seconds (ref 24–36)

## 2014-06-07 MED ORDER — METOPROLOL SUCCINATE ER 100 MG PO TB24
100.0000 mg | ORAL_TABLET | Freq: Every day | ORAL | Status: DC
  Administered 2014-06-07 – 2014-06-08 (×2): 100 mg via ORAL
  Filled 2014-06-07 (×2): qty 1

## 2014-06-07 MED ORDER — SODIUM CHLORIDE 0.9 % IV SOLN: 1.0000 g | Freq: Once | INTRAVENOUS | Status: DC

## 2014-06-07 MED ORDER — HEPARIN (PORCINE) IN NACL 100-0.45 UNIT/ML-% IJ SOLN
700.0000 [IU]/h | INTRAMUSCULAR | Status: DC
  Administered 2014-06-07: 700 [IU]/h via INTRAVENOUS
  Filled 2014-06-07 (×2): qty 250

## 2014-06-07 MED ORDER — ONDANSETRON HCL 4 MG/2ML IJ SOLN: 4.0000 mg | Freq: Four times a day (QID) | INTRAMUSCULAR | Status: DC | PRN

## 2014-06-07 MED ORDER — ASPIRIN EC 81 MG PO TBEC
81.0000 mg | DELAYED_RELEASE_TABLET | Freq: Every day | ORAL | Status: DC
  Administered 2014-06-07 – 2014-06-08 (×2): 81 mg via ORAL
  Filled 2014-06-07 (×2): qty 1

## 2014-06-07 MED ORDER — ISOSORBIDE MONONITRATE ER 60 MG PO TB24: 60.0000 mg | ORAL_TABLET | Freq: Every day | ORAL | Status: DC

## 2014-06-07 MED ORDER — CLOPIDOGREL BISULFATE 75 MG PO TABS
75.0000 mg | ORAL_TABLET | Freq: Every day | ORAL | Status: DC
  Administered 2014-06-08: 75 mg via ORAL
  Filled 2014-06-07: qty 1

## 2014-06-07 MED ORDER — LISINOPRIL 20 MG PO TABS
20.0000 mg | ORAL_TABLET | Freq: Every day | ORAL | Status: DC
  Administered 2014-06-07 – 2014-06-08 (×2): 20 mg via ORAL
  Filled 2014-06-07 (×2): qty 1

## 2014-06-07 MED ORDER — ONDANSETRON HCL 4 MG PO TABS: 4.0000 mg | ORAL_TABLET | Freq: Four times a day (QID) | ORAL | Status: DC | PRN

## 2014-06-07 MED ORDER — ATORVASTATIN CALCIUM 20 MG PO TABS: 80.0000 mg | ORAL_TABLET | Freq: Every day | ORAL | Status: DC

## 2014-06-07 MED ORDER — FLUOXETINE HCL 20 MG PO CAPS
20.0000 mg | ORAL_CAPSULE | Freq: Every day | ORAL | Status: DC
  Administered 2014-06-08: 20 mg via ORAL
  Filled 2014-06-07: qty 1

## 2014-06-07 MED ORDER — DIPHENHYDRAMINE HCL 25 MG PO CAPS
25.0000 mg | ORAL_CAPSULE | Freq: Every evening | ORAL | Status: DC | PRN
  Administered 2014-06-07: 25 mg via ORAL
  Filled 2014-06-07: qty 1

## 2014-06-07 MED ORDER — DOCUSATE SODIUM 100 MG PO CAPS
100.0000 mg | ORAL_CAPSULE | Freq: Two times a day (BID) | ORAL | Status: DC
Start: 2014-06-07 — End: 2014-06-08
  Administered 2014-06-08: 100 mg via ORAL
  Filled 2014-06-07 (×2): qty 1

## 2014-06-07 MED ORDER — HEPARIN BOLUS VIA INFUSION
2500.0000 [IU] | Freq: Once | INTRAVENOUS | Status: AC
  Administered 2014-06-07: 2500 [IU] via INTRAVENOUS
  Filled 2014-06-07: qty 2500

## 2014-06-07 MED ORDER — DILTIAZEM HCL 25 MG/5ML IV SOLN
20.0000 mg | Freq: Once | INTRAVENOUS | Status: AC
  Administered 2014-06-07: 20 mg via INTRAVENOUS

## 2014-06-07 MED ORDER — ACETAMINOPHEN 650 MG RE SUPP: 650.0000 mg | Freq: Four times a day (QID) | RECTAL | Status: DC | PRN

## 2014-06-07 MED ORDER — ALBUTEROL SULFATE (2.5 MG/3ML) 0.083% IN NEBU: 3.0000 mL | INHALATION_SOLUTION | Freq: Four times a day (QID) | RESPIRATORY_TRACT | Status: DC | PRN

## 2014-06-07 MED ORDER — POTASSIUM CHLORIDE CRYS ER 20 MEQ PO TBCR
40.0000 meq | EXTENDED_RELEASE_TABLET | Freq: Once | ORAL | Status: AC
  Administered 2014-06-07: 40 meq via ORAL
  Filled 2014-06-07: qty 2

## 2014-06-07 MED ORDER — ACETAMINOPHEN 325 MG PO TABS
650.0000 mg | ORAL_TABLET | Freq: Four times a day (QID) | ORAL | Status: DC | PRN
Start: 2014-06-07 — End: 2014-06-08
  Administered 2014-06-08: 650 mg via ORAL
  Filled 2014-06-07: qty 2

## 2014-06-07 MED ORDER — DEXTROSE 5 % IV SOLN
5.0000 mg/h | INTRAVENOUS | Status: DC
  Administered 2014-06-07: 5 mg/h via INTRAVENOUS

## 2014-06-07 MED ORDER — PANTOPRAZOLE SODIUM 40 MG PO TBEC
40.0000 mg | DELAYED_RELEASE_TABLET | Freq: Every day | ORAL | Status: DC
  Administered 2014-06-07 – 2014-06-08 (×2): 40 mg via ORAL
  Filled 2014-06-07 (×2): qty 1

## 2014-06-07 MED ORDER — DILTIAZEM HCL 100 MG IV SOLR
5.0000 mg/h | Freq: Once | INTRAVENOUS | Status: AC
  Administered 2014-06-07: 5 mg/h via INTRAVENOUS
  Filled 2014-06-07: qty 100

## 2014-06-07 MED ORDER — SODIUM CHLORIDE 0.9 % IV BOLUS (SEPSIS)
1000.0000 mL | Freq: Once | INTRAVENOUS | Status: AC
  Administered 2014-06-07: 1000 mL via INTRAVENOUS

## 2014-06-07 NOTE — ED Notes (Signed)
Pt assisted to bedpan at this time, tolerated well. No acute distress noted.

## 2014-06-07 NOTE — Progress Notes (Signed)
ANTICOAGULATION CONSULT NOTE - Initial Consult  Pharmacy Consult for IV Heparin Indication: atrial fibrillation  Allergies  Allergen Reactions  . Levaquin [Levofloxacin] Other (See Comments)    Pt states she have nose bleeds and pain in arms and shoulders.    Patient Measurements: Height: 5' (152.4 cm) Weight: 139 lb (63.05 kg) IBW/kg (Calculated) : 45.5 Heparin Dosing Weight: 50.4kg  Vital Signs: Temp: 98.4 F (36.9 C) (05/07 1619) Temp Source: Oral (05/07 1619) BP: 146/72 mmHg (05/07 1830) Pulse Rate: 109 (05/07 1830)  Labs:  Recent Labs  06/07/14 1605  HGB 14.4  HCT 43.0  PLT 312  LABPROT 12.7  INR 0.93  CREATININE 1.17*  TROPONINI 0.04*    Estimated Creatinine Clearance: 30.2 mL/min (by C-G formula based on Cr of 1.17).   Medical History: Past Medical History  Diagnosis Date  . Stroke   . Hypertension     Medications:   (Not in a hospital admission) Scheduled:  . heparin  2,500 Units Intravenous Once  . potassium chloride  40 mEq Oral Once   Infusions:  . heparin      Assessment: 18 yoF with hx A-fib and cardiac stents x4 now in A-fib with RVR.  IV Heparin per Rx. Goal of Therapy:  Heparin level 0.3-0.7 units/ml Monitor platelets by anticoagulation protocol: Yes   Plan:   Baseline aPtt stat  Hepairn 2500 unit bolus x1  Start drip @ 700 units/hr  Daily CBC/HL  Check 1st HL in 8 hours  Dorrene German 06/07/2014,6:49 PM

## 2014-06-07 NOTE — ED Provider Notes (Signed)
[facility] Emergency Department Provider Note  ____________________________________________  Time seen: 3:50 PM  I have reviewed the triage vital signs and the nursing notes.   HISTORY  Chief Complaint Tachycardia    HPI Samantha [name] is a 81 y.o. female who reports onset of shortness of breath and palpitations and central chest pain described as tightness , nonradiating, not relieved by nitroglycerin, moderate intensity.Marland Kitchen about 2:45 PM today. EMS was called and noted that the patient was tachycardic with a heart rate in the 180s. They also noted on EKG that she had runs of ventricular tachycardia. She was given 10 mg of IV diltiazem at 3:30 PM, as the patient was being transported to the ED. On arrival she reports that she feels little bit better and the chest pain has resolved, but still feels very short of breath. Her heart rate is fluctuating between 140 and 160.  Her cardiologist is Dr. Saralyn Pilar    Past Medical History  Diagnosis Date  . Stroke   . Hypertension    Atrial fibrillation There are no active problems to display for this patient.   Past Surgical History  Procedure Laterality Date  . Cholecystectomy    . Fracture surgery    . Appendectomy    . Abdominal hysterectomy      No current outpatient prescriptions on file.  Allergies Levaquin  History reviewed. No pertinent family history.  Social History History  Substance Use Topics  . Smoking status: Never Smoker   . Smokeless tobacco: Not on file  . Alcohol Use: No    Review of Systems  Constitutional: No fever or chills. No weight changes Eyes:No blurry vision or double vision.  ENT: No sore throat. Cardiovascular: Recent chest pain, lasted approximately one hour, now resolved Respiratory: Current shortness of breath, recent nonproductive cough. Gastrointestinal: Negative for abdominal pain, vomiting and diarrhea.  No BRBPR or melena. Genitourinary: Negative for  dysuria, urinary retention, bloody urine, or difficulty urinating. Musculoskeletal: Negative for back pain. No joint swelling or pain. Skin: Negative for rash. Neurological: Negative for headaches, focal weakness or numbness. Psychiatric:No anxiety or depression.   Endocrine:No hot/cold intolerance, changes in energy, or sleep difficulty.  10-point ROS otherwise negative.  ____________________________________________   PHYSICAL EXAM:  VITAL SIGNS: ED Triage Vitals  Enc Vitals Group     BP --      Pulse --      Resp --      Temp --      Temp src --      SpO2 --      Weight --      Height --      Head Cir --      Peak Flow --      Pain Score --      Pain Loc --      Pain Edu? --      Excl. in [place]? --      Constitutional: Alert and oriented. Moderate distress Eyes: No scleral icterus. No conjunctival pallor. PERRL. EOMI ENT   Head: Normocephalic and atraumatic.   Nose: No congestion/rhinnorhea. No septal hematoma   Mouth/Throat: MMM, no pharyngeal erythema   Neck: No stridor. No SubQ emphysema.  Hematological/Lymphatic/Immunilogical: No cervical lymphadenopathy. Cardiovascular: Irregularly irregular rhythm, tachycardic with heart rate 140-160.Marland Kitchen Normal and symmetric distal pulses are present in all extremities. No murmurs, rubs, or gallops. Respiratory: Tachypnea, respiratory rate of about 35, shallow inspirations. Breath sounds are clear and equal bilaterally. No wheezes/rales/rhonchi. Gastrointestinal: Soft  and nontender. No distention. There is no CVA tenderness.  No rebound, rigidity, or guarding. Genitourinary: deferred Musculoskeletal: Nontender with normal range of motion in all extremities. No joint effusions.  No lower extremity tenderness.  No edema. Neurologic:   Normal speech and language.  CN 2-10 normal. Motor grossly intact. No pronator drift.  Normal gait. No gross focal neurologic deficits are appreciated.  Skin:  Skin is warm, dry and  intact. No rash noted.  No petechiae, purpura, or bullae. Psychiatric: Mood and affect are normal. Speech and behavior are normal. Patient exhibits appropriate insight and judgment.  ____________________________________________    LABS (pertinent positives/negatives) Labs Reviewed  COMPREHENSIVE METABOLIC PANEL - Abnormal; Notable for the following:    Potassium 3.3 (*)    Glucose, Bld 136 (*)    BUN 25 (*)    Creatinine, Ser 1.17 (*)    GFR calc non Af Amer 42 (*)    GFR calc Af Amer 49 (*)    All other components within normal limits  TROPONIN I - Abnormal; Notable for the following:    Troponin I 0.04 (*)    All other components within normal limits  CBC WITH DIFFERENTIAL/PLATELET  PROTIME-INR  URINALYSIS COMPLETEWITH MICROSCOPIC ([facility])    ____________________________________________   EKG  EMS EKGs prior to arrival demonstrate an initial narrow complex tachycardia with a rate in the 180s, with some mild 1-2 mm ST elevations. There EKGs also demonstrate a run of nonsustained ventricular tachycardia lasting approximately 5 seconds. After giving 10 mg of IV diltiazem, they're EKG shows A. fib with RVR with a rate of approximately 120.  Emergency department EKG reveals A. fib with RVR, rate of 142, normal axis, normal intervals except for absent PR interval. Poor R-wave progression in the anterior precordial leads, Q waves in the inferior leads. Normal ST segments. Altered criteria for LVH with repolarization abnormality presenting as T-wave inversions in the high lateral leads.  ____________________________________________    RADIOLOGY  Chest x-ray portable unremarkable.  ____________________________________________   PROCEDURES CRITICAL CARE Performed by: Joni Fears, Zeidy Tayag   Total critical care time: 35 minutes  Critical care time was exclusive of separately billable procedures and treating other patients.  Critical care was necessary to treat or prevent  imminent or life-threatening deterioration.  Critical care was time spent personally by me on the following activities: development of treatment plan with patient and/or surrogate as well as nursing, discussions with consultants, evaluation of patient's response to treatment, examination of patient, obtaining history from patient or surrogate, ordering and performing treatments and interventions, ordering and review of laboratory studies, ordering and review of radiographic studies, pulse oximetry and re-evaluation of patient's condition.  ____________________________________________   INITIAL IMPRESSION / ASSESSMENT AND PLAN / ED COURSE  Pertinent labs & imaging results that were available during my care of the patient were reviewed by me and considered in my medical decision making (see chart for details).  The patient presents with A. fib with RVR. Since an initial dose of IV diltiazem failed to control her, we will repeat the dose and start her on IV Dilaudid drip. He doesn't have any other specific symptoms to the to a specific predisposing an etiology, so we'll begin the workup with blood tests, urinalysis, chest x-ray. Due to the concerning nature of her symptoms and EKG changes during the high myocardial oxygen demand of her severe tachycardia, and we'll plan to admit the patient for cardiac workup and control of her A. fib. ----------------------------------------- 4:13 PM on 06/07/2014 -----------------------------------------  The patient's heart rate after repeat dose of IV diltiazem is 95-110. Blood pressure is about 140/90. She reports feeling a little bit better but still dyspneic. We will continue to to start her on an IV diltiazem drip and plan to admit her. ----------------------------------------- 5:09 PM on 06/07/2014 -----------------------------------------  Blood pressure stable, heart rate still 110 to 1:30 on tilt 5 mg per hour. Will increase to 7.5 mg per hour and  continue monitoring. Still awaiting blood test results including troponin.  ----------------------------------------- 5:37 PM on 06/07/2014 -----------------------------------------  Troponin results available. 0.04, slightly elevated. With her chest pain, shortness of breath and A. fib with RVR needing a diltiazem drip, we will proceed to admit her to the hospital for further evaluation. ____________________________________________   FINAL CLINICAL IMPRESSION(S) / ED DIAGNOSES  Final diagnoses:  Atrial fibrillation with rapid ventricular response  Ischemic chest pain      Carrie Mew, MD 06/07/14 [id number]

## 2014-06-07 NOTE — H&P (Signed)
[facility] NAME: Samantha [name]    MR#:  [id number]  DATE OF BIRTH:  August 10, 1928  DATE OF ADMISSION:  06/07/2014  PRIMARY CARE PHYSICIAN: LADA, Satira Anis, MD   REQUESTING/REFERRING PHYSICIAN: Joni Fears, MD.  CHIEF COMPLAINT:   Chief Complaint  Patient presents with  . Tachycardia    HISTORY OF PRESENT ILLNESS:  Samantha [name]  is a 81 y.o. female with a known history of HTN, CAD, CVA and HLP. The patient came to the ED due to palpitations and has been on the left side today. The patient is alert awake oriented in no acute distress. Patient started to have a palpitation and chest pain on the left side this morning. The chest is on the left side radiation to the left shoulder, lasted about several hours. In addition, she feels weak and dizzy. Patient was noticed to have tachycardia at 170. She was treated with IV Cardizem by EMS. Heart rate decreased to 142 ED. The patient was treated with Cardizem IV push in the ED without improvement. The patient was started with the Cardizem drip.  PAST MEDICAL HISTORY:   Past Medical History  Diagnosis Date  . Stroke   . Hypertension     PAST SURGICAL HISTORY:   Past Surgical History  Procedure Laterality Date  . Cholecystectomy    . Fracture surgery    . Appendectomy    . Abdominal hysterectomy      SOCIAL HISTORY:   History  Substance Use Topics  . Smoking status: Never Smoker   . Smokeless tobacco: Not on file  . Alcohol Use: No    FAMILY HISTORY:  History reviewed. No pertinent family history.  DRUG ALLERGIES:   Allergies  Allergen Reactions  . Levaquin [Levofloxacin] Other (See Comments)    Pt states she have nose bleeds and pain in arms and shoulders.    REVIEW OF SYSTEMS:  CONSTITUTIONAL: No fever, has generalized weakness.  EYES: No blurred or double vision.  EARS, NOSE, AND THROAT: No tinnitus or ear pain.  RESPIRATORY: No cough, shortness of breath,  wheezing or hemoptysis.  CARDIOVASCULAR: Positive for chest pain and palpitation, but no orthopnea or edema.  GASTROINTESTINAL: No nausea, vomiting, diarrhea or abdominal pain.  GENITOURINARY: No dysuria, hematuria.  ENDOCRINE: No polyuria, nocturia,  HEMATOLOGY: No anemia, easy bruising or bleeding SKIN: No rash or lesion. MUSCULOSKELETAL: No joint pain or arthritis.   NEUROLOGIC: No tingling, numbness, weakness.  PSYCHIATRY: Has anxiety but depression. Patient has distress from family recently.  MEDICATIONS AT HOME:   Prior to Admission medications   Medication Sig Start Date End Date Taking? Authorizing Provider  albuterol (PROVENTIL HFA;VENTOLIN HFA) 108 (90 BASE) MCG/ACT inhaler Inhale 1-2 puffs into the lungs every 6 (six) hours as needed for wheezing or shortness of breath (pneumonia).   Yes Historical Provider, MD  aspirin EC 81 MG tablet Take 81 mg by mouth.   Yes Historical Provider, MD  atorvastatin (LIPITOR) 80 MG tablet Take 80 mg by mouth daily.   Yes Historical Provider, MD  clopidogrel (PLAVIX) 75 MG tablet Take 75 mg by mouth daily.   Yes Historical Provider, MD  FLUoxetine (PROZAC) 20 MG capsule Take 20 mg by mouth daily.   Yes Historical Provider, MD  hydrochlorothiazide (HYDRODIURIL) 25 MG tablet Take 25 mg by mouth daily.   Yes Historical Provider, MD  isosorbide mononitrate (IMDUR) 60 MG 24 hr tablet Take 60 mg by mouth daily.   Yes  Historical Provider, MD  lisinopril (PRINIVIL,ZESTRIL) 20 MG tablet Take 20 mg by mouth daily.   Yes Historical Provider, MD  metoprolol succinate (TOPROL-XL) 100 MG 24 hr tablet Take 100 mg by mouth daily. Take with or immediately following a meal.   Yes Historical Provider, MD  omeprazole (PRILOSEC) 20 MG capsule Take 20 mg by mouth daily.   Yes Historical Provider, MD      VITAL SIGNS:  Blood pressure 126/96, pulse 118, temperature 98.4 F (36.9 C), temperature source Oral, resp. rate 34, height 5' (1.524 m), weight 63.05 kg (139  lb), SpO2 97 %.  PHYSICAL EXAMINATION:  GENERAL:  81 y.o.-year-old patient lying in the bed with no acute distress.  EYES: Pupils equal, round, reactive to light and accommodation. No scleral icterus. Extraocular muscles intact.  HEENT: Head atraumatic, normocephalic. Oropharynx and nasopharynx clear.  NECK:  Supple, no jugular venous distention. No thyroid enlargement, no tenderness.  LUNGS: Normal breath sounds bilaterally, no wheezing, rales,rhonchi or crepitation. No use of accessory muscles of respiration.  CARDIOVASCULAR: S1, S2 normal, irregular rate and rhythm, tachycardia. No murmurs, rubs, or gallops.  ABDOMEN: Soft, nontender, nondistended. Bowel sounds present. No organomegaly or mass.  EXTREMITIES: No pedal edema, cyanosis, or clubbing.  NEUROLOGIC: Cranial nerves II through XII are intact. Muscle strength 5/5 in all extremities. Sensation intact. Gait not checked.  PSYCHIATRIC: The patient is alert and oriented x 3.  SKIN: No obvious rash, lesion, or ulcer.   LABORATORY PANEL:   CBC  Recent Labs Lab 06/07/14 1605  WBC 7.8  HGB 14.4  HCT 43.0  PLT 312   ------------------------------------------------------------------------------------------------------------------  Chemistries   Recent Labs Lab 06/07/14 1605  NA 137  K 3.3*  CL 102  CO2 27  GLUCOSE 136*  BUN 25*  CREATININE 1.17*  CALCIUM 9.5  AST 28  ALT 17  ALKPHOS 78  BILITOT 0.5   ------------------------------------------------------------------------------------------------------------------  Cardiac Enzymes  Recent Labs Lab 06/07/14 1605  TROPONINI 0.04*   ------------------------------------------------------------------------------------------------------------------  RADIOLOGY:  Dg Chest 1 View  06/07/2014   CLINICAL DATA:  Dyspnea.  Tachycardia today.  EXAM: CHEST  1 VIEW  COMPARISON:  03/20/2013.  FINDINGS: The cardiac silhouette remains borderline enlarged. Clear lungs with normal  vascularity. Diffuse osteopenia.  IMPRESSION: No acute abnormality.   Electronically Signed   By: Claudie Revering M.D.   On: 06/07/2014 16:53    EKG:  A. fib with RVR at 142 bpm.  IMPRESSION AND PLAN:   A. fib with RVR:  The patient will be admitted to stepdown unit. I will continue telemetry monitor and Cardizem drip. I will start heparin drip dose per pharmacy and get cardiology consult. Anticoagulation defer to cardiologist. I will continue aspirin and Plavix, and continue patient's home medications Lopressor. Dehydration:  I will hold HCTZ and a follow-up BMP. Hypokalemia:  I will give potassium supplement and check magnesium level and follow-up BMP. CAD and history of CVA: Continue aspirin,  Plavix and the Lipitor. Hypertension: I will continue lisinopril, Lopressor and imdur, but hold HCTZ due to dehydration.     All the records are reviewed and case discussed with ED provider. Management plans discussed with the patient, family and they are in agreement.  CODE STATUS: Full code.  TOTAL TIME TAKING critical CARE OF THIS PATIENT: 63 minutes.    Demetrios Loll M.D on 06/07/2014 at 6:31 PM  Between 7am to 6pm - Pager - [phone]  After 6pm go to [email or web address] - password [email or web address] [facility]  Office  [phone]  CC: Primary care physician; LADA, Satira Anis, MD

## 2014-06-07 NOTE — ED Notes (Signed)
Pt to ED from home via EMS with onset of tachycardia that began while pt was heading to neighbors house this afternoon. Per EMS pt was tachy at 170, given 10mg  of cardizem, then was tachy at 130. On arrival pt was in Afib with RVR, MD stafford at bedside upon arrival. Pt states presence of chest pain and SOB sating at 95% RA. Pt with hx os Afib, and four cardiac stents.

## 2014-06-08 LAB — CBC
HEMATOCRIT: 38.3 % (ref 35.0–47.0)
Hemoglobin: 13 g/dL (ref 12.0–16.0)
MCH: 31.4 pg (ref 26.0–34.0)
MCHC: 33.8 g/dL (ref 32.0–36.0)
MCV: 92.7 fL (ref 80.0–100.0)
PLATELETS: 289 10*3/uL (ref 150–440)
RBC: 4.13 MIL/uL (ref 3.80–5.20)
RDW: 12.8 % (ref 11.5–14.5)
WBC: 8 10*3/uL (ref 3.6–11.0)

## 2014-06-08 LAB — HEPARIN LEVEL (UNFRACTIONATED): Heparin Unfractionated: 0.29 IU/mL — ABNORMAL LOW (ref 0.30–0.70)

## 2014-06-08 LAB — TROPONIN I
TROPONIN I: 0.05 ng/mL — AB (ref <0.031)
Troponin I: 0.1 ng/mL — ABNORMAL HIGH (ref <0.031)

## 2014-06-08 LAB — BASIC METABOLIC PANEL
ANION GAP: 7 (ref 5–15)
BUN: 21 mg/dL — ABNORMAL HIGH (ref 6–20)
CO2: 26 mmol/L (ref 22–32)
Calcium: 9.1 mg/dL (ref 8.9–10.3)
Chloride: 106 mmol/L (ref 101–111)
Creatinine, Ser: 0.94 mg/dL (ref 0.44–1.00)
GFR calc Af Amer: >60 mL/min (ref >60)
GFR, EST NON AFRICAN AMERICAN: 55 mL/min — AB (ref >60)
Glucose, Bld: 104 mg/dL — ABNORMAL HIGH (ref 65–99)
POTASSIUM: 4.1 mmol/L (ref 3.5–5.1)
SODIUM: 139 mmol/L (ref 135–145)

## 2014-06-08 MED ORDER — HEPARIN BOLUS VIA INFUSION
750.0000 [IU] | Freq: Once | INTRAVENOUS | Status: AC
  Administered 2014-06-08: 750 [IU] via INTRAVENOUS
  Filled 2014-06-08: qty 750

## 2014-06-08 MED ORDER — APIXABAN 5 MG PO TABS: 5.0000 mg | ORAL_TABLET | Freq: Two times a day (BID) | ORAL | Status: DC

## 2014-06-08 MED ORDER — HEPARIN (PORCINE) IN NACL 100-0.45 UNIT/ML-% IJ SOLN: 800.0000 [IU]/h | INTRAMUSCULAR | Status: DC

## 2014-06-08 NOTE — Consult Note (Signed)
[facility] Cardiology Consultation Note  Patient ID: Samantha [name], MRN: [id number], DOB/AGE: 02/11/1928 81 y.o. Admit date: 06/07/2014   Date of Consult: 06/08/2014 Primary Physician: Corey Skains, MD Primary Cardiologist: Sim Boast as  Chief Complaint:  Chief Complaint  Patient presents with  . Tachycardia   Reason for Consult: atrial fibrillation with rapid ventricular rate  HPI: 81 y.o. female with known coronary artery disease status post previous PCI and stent placement without evidence of previous myocardial infarction essential hypertension mixed hyperlipidemia and previous peripheral vascular disease with stroke who's had new onset of significant palpitations rapid heartbeat and pounding in her chest with shortness of breath. This occurred on the sudden event and therefore was seen in the emergency room. At that time the patient had an EKG showing atrial fibrillation with rapid ventricular rate acquired further intervention. The patient was given intravenous diltiazem as well as patient was given in true venous diltiazem drip. The patient therefore had spontaneous conversion to normal sinus rhythm without any further issues. Her shortness of breath pounding and other symptoms completely resolved and she feels quite well at this time without any other evidence of congestive heart failure or angina. The patient has now an EKG showing normal sinus rhythm. Normal EKG. She does have an elevation of 0.1 of troponin most consistent with demand ischemia and no current evidence of myocardial infarction. Hypertension has been well controlled in the past with medication management and stable at this time. She has also been on medication management for hyperlipidemia and previous stroke with therapy levels stable at this time.  Past Medical History  Diagnosis Date  . Stroke   . Hypertension       Most Recent Cardiac Studies:    Surgical History:  Past Surgical History  Procedure  Laterality Date  . Cholecystectomy    . Fracture surgery    . Appendectomy    . Abdominal hysterectomy       Home Meds: Prior to Admission medications   Medication Sig Start Date End Date Taking? Authorizing Provider  albuterol (PROVENTIL HFA;VENTOLIN HFA) 108 (90 BASE) MCG/ACT inhaler Inhale 1-2 puffs into the lungs every 6 (six) hours as needed for wheezing or shortness of breath (pneumonia).   Yes Historical Provider, MD  aspirin EC 81 MG tablet Take 81 mg by mouth.   Yes Historical Provider, MD  atorvastatin (LIPITOR) 80 MG tablet Take 80 mg by mouth daily.   Yes Historical Provider, MD  clopidogrel (PLAVIX) 75 MG tablet Take 75 mg by mouth daily.   Yes Historical Provider, MD  FLUoxetine (PROZAC) 20 MG capsule Take 20 mg by mouth daily.   Yes Historical Provider, MD  hydrochlorothiazide (HYDRODIURIL) 25 MG tablet Take 25 mg by mouth daily.   Yes Historical Provider, MD  isosorbide mononitrate (IMDUR) 60 MG 24 hr tablet Take 60 mg by mouth daily.   Yes Historical Provider, MD  lisinopril (PRINIVIL,ZESTRIL) 20 MG tablet Take 20 mg by mouth daily.   Yes Historical Provider, MD  metoprolol succinate (TOPROL-XL) 100 MG 24 hr tablet Take 100 mg by mouth daily. Take with or immediately following a meal.   Yes Historical Provider, MD  omeprazole (PRILOSEC) 20 MG capsule Take 20 mg by mouth daily.   Yes Historical Provider, MD  apixaban (ELIQUIS) 5 MG TABS tablet Take 1 tablet (5 mg total) by mouth 2 (two) times daily. 06/08/14   Bettey Costa, MD    Inpatient Medications:  . aspirin EC  81 mg Oral Daily  .  atorvastatin  80 mg Oral Daily  . clopidogrel  75 mg Oral Daily  . docusate sodium  100 mg Oral BID  . FLUoxetine  20 mg Oral Daily  . isosorbide mononitrate  60 mg Oral Daily  . lisinopril  20 mg Oral Daily  . metoprolol succinate  100 mg Oral Daily  . pantoprazole  40 mg Oral Daily   . diltiazem (CARDIZEM) infusion Stopped (06/07/14 2230)  . heparin 800 Units/hr (06/08/14 0800)     Allergies:  Allergies  Allergen Reactions  . Levaquin [Levofloxacin] Other (See Comments)    Pt states she have nose bleeds and pain in arms and shoulders.    History   Social History  . Marital Status: Married    Spouse Name: N/A  . Number of Children: N/A  . Years of Education: N/A   Occupational History  . Not on file.   Social History Main Topics  . Smoking status: Never Smoker   . Smokeless tobacco: Not on file  . Alcohol Use: No  . Drug Use: Not on file  . Sexual Activity: Not on file   Other Topics Concern  . Not on file   Social History Narrative  . No narrative on file     History reviewed. No pertinent family history.   Review of Systems Positive for chest discomfort and shortness of breath Negative for: General:  for chills, fever, night sweats or weight changes.  Cardiovascular: PND orthopnea syncope dizziness  Dermatological skin lesions rashes Respiratory: Cough congestion Urologic: Frequent urination urination at night and hematuria Abdominal: negative for nausea, vomiting, diarrhea, bright red blood per rectum, melena, or hematemesis Neurologic: negative for visual changes, and/or hearing changes  All other systems reviewed and are otherwise negative except as noted above.  Labs:  Recent Labs  06/07/14 1605 06/08/14 0203 06/08/14 0714  TROPONINI 0.04* 0.10* 0.05*   Lab Results  Component Value Date   WBC 8.0 06/08/2014   HGB 13.0 06/08/2014   HCT 38.3 06/08/2014   MCV 92.7 06/08/2014   PLT 289 06/08/2014    Recent Labs Lab 06/07/14 1605 06/08/14 0203  NA 137 139  K 3.3* 4.1  CL 102 106  CO2 27 26  BUN 25* 21*  CREATININE 1.17* 0.94  CALCIUM 9.5 9.1  PROT 7.2  --   BILITOT 0.5  --   ALKPHOS 78  --   ALT 17  --   AST 28  --   GLUCOSE 136* 104*   No results found for: CHOL, HDL, LDLCALC, TRIG No results found for: DDIMER  Radiology/Studies:  Dg Chest 1 View  06/07/2014   CLINICAL DATA:  Dyspnea.  Tachycardia  today.  EXAM: CHEST  1 VIEW  COMPARISON:  03/20/2013.  FINDINGS: The cardiac silhouette remains borderline enlarged. Clear lungs with normal vascularity. Diffuse osteopenia.  IMPRESSION: No acute abnormality.   Electronically Signed   By: Claudie Revering M.D.   On: 06/07/2014 16:53   Mr Lumbar Spine Wo Contrast  06/05/2014   CLINICAL DATA:  Low back pain radiating into the left buttock and posterior left leg for 6 months.  EXAM: MRI LUMBAR SPINE WITHOUT CONTRAST  TECHNIQUE: Multiplanar, multisequence MR imaging of the lumbar spine was performed. No intravenous contrast was administered.  COMPARISON:  None.  FINDINGS: Vertebral body height, signal and alignment are maintained. The conus medullaris is normal in signal and position. Imaged intra-abdominal contents are unremarkable.  The T10-11, T11-12 and T12-L1 levels are imaged in the sagittal  plane only and negative.  L1-2:  Negative.  L2-3:  Negative.  L3-4: Very shallow disc bulge is identified but the central canal and foramina are open.  L4-5: Mild to moderate facet degenerative change is seen and there is a shallow disc bulge and ligamentum flavum thickening. Mild central canal narrowing is present. The foramina are widely patent. No nerve root compression is identified.  L5-S1: Facet degenerative disease is more notable on the right. No disc bulge or protrusion. The central canal and foramina are widely patent.  IMPRESSION: Spondylosis most notable at L4-5 where there is mild central canal narrowing due to a shallow disc bulge. No nerve root compression is identified.  Lower lumbar facet arthropathy appearing most notable at L5-S1 on the right.   Electronically Signed   By: Inge Rise M.D.   On: 06/05/2014 16:09    EKG: Atrial fibrillation with rapid ventricular rate and nonspecific ST and T-wave changes  Weights: Filed Weights   06/07/14 1619 06/08/14 0528  Weight: 139 lb (63.05 kg) 155 lb 10.3 oz (70.6 kg)     Physical Exam: Blood pressure  140/61, pulse 60, temperature 98.4 F (36.9 C), temperature source Oral, resp. rate 21, height 5' (1.524 m), weight 155 lb 10.3 oz (70.6 kg), SpO2 97 %. Body mass index is 30.4 kg/(m^2). General: Well developed, well nourished, in no acute distress. Head eyes ears nose throat: Normocephalic, atraumatic, sclera non-icteric, no xanthomas, nares are without discharge. No apparent thyromegaly and/or mass  Lungs: Normal respiratory effort. Clear bilaterally to auscultation without wheezes, rales, or rhonchi.  Heart: RRR with normal S1 S2. 2+ aortic right upper sternal border murmur gallop or rub PMI is normal size and placement carotid upstroke normal without bruit jugular venous pressure is normal Abdomen: Soft, non-tender, non-distended with normoactive bowel sounds. No hepatomegaly. No rebound/guarding. No obvious abdominal masses. Abdominal aorta is normal size without bruit Extremities: No edema cyanosis clubbing or ulcers  Peripheral : 2+ bilateral upper extremity pulses, 2+ bilateral femoral pulses 2+ bilateral dorsal pedal pulse Neuro: Alert and oriented X 3. No facial asymmetry. No focal deficit. Moves all extremities spontaneously. Musculoskeletal: Normal muscle tone without kyphosis Psych:  Responds to questions appropriately with a normal affect.    Assessment: 81 year old female with known coronary artery disease status post previous PCI and stent placement essential hypertension mixed hyperlipidemia peripheral vascular disease with previous stroke with acute onset of atrial fibrillation non-valvular in nature with rapid ventricular rate and symptoms fully resolved at this time and remaining in normal sinus rhythm with appropriate medication management. The patient did have an elevated troponin most consistent with demand ischemia and no current evidence of cardial infarction and/or acute coronary syndrome  Plan: 1. Metoprolol  for further risk reduction of atrial fibrillation with rapid  ventricular rate 2. Anticoagulation with elequis for further risk reduction in stroke with atrial fibrillation and previous stroke history and high CHA DS score 3. Discontinuation of aspirin and Plavix due to concerns of bleeding complications with use of elequis 4. Ambulation and following for any further significant symptoms and possible discharge to home today with follow-up in one to 2 days for adjustments of medication management 5. No further intervention of minimal elevation of troponin due to demand ischemia and no current evidence of myocardial infarction 6. Continue high intensity cholesterol therapy with atorvastatin 7. No further cardiac diagnostic tests his this time  Carlena Bjornstad M.D. [facility] 06/08/2014, 9:09 AM

## 2014-06-08 NOTE — Discharge Summary (Signed)
[facility] [place] NAME: Samantha [name]    MR#:  [id number]  DATE OF BIRTH:  07-05-1928  DATE OF ADMISSION:  06/07/2014 ADMITTING PHYSICIAN: Demetrios Loll, MD  DATE OF DISCHARGE: 06/08/2014   PRIMARY CARE PHYSICIAN: Corey Skains, MD    ADMISSION DIAGNOSIS:  Atrial fibrillation with rapid ventricular response [I48.91] Ischemic chest pain [I20.9]  DISCHARGE DIAGNOSIS:  Principal Problem:   Atrial fibrillation with RVR Active Problems:   HTN (hypertension)   CVA (cerebral infarction)   A-fib   CAD (coronary artery disease)   Dehydration   Hypokalemia   SECONDARY DIAGNOSIS:   Past Medical History  Diagnosis Date  . Stroke   . Hypertension     HOSPITAL COURSE:  This is a very pleasant [age] female with a history of hypertension, CAD, CVA who presented to the emergency department complaining of palpitations. She was noted have new onset atrial fib relation. For further details please refer to H&P.  1. Atrial fibrillation with RVR: Patient was admitted to stepdown unit. She was placed on a diltiazem drip. Her atrial fibrillation converted to normal sinus rhythm prior to coming to stepdown unit. She was in normal sinus rhythm throughout her auscultation. She'll continue her outpatient medications and have close follow-up with her cardiologist. We discussed anticoagulation with the patient. She is currently on aspirin and Plavix. As per my conversation with Dr. Jose Persia patient does not need aspirin and Plavix as her stents were over 1 year ago. I discussed different options for anticoagulation with the patient. In the end she chose Eliquis. Side effects, alternatives, risks and benefits were discussed in great detail with the patient and she understood these risks. Aspirin and Plavix and she will start on Eliquis.  2. Hypokalemia: Patient was supplemented potassium.  3. History of CAD. Patient is now on Eliquis. She'll continue  metoprolol. She will have follow-up with her cardiologist  4. History of CVA: Patient will continue on Eliquis. Due to the high risk of bleeding patient's aspirin and Plavix are discontinued. This was discussed with the patient and in the end she did make this decision.  5. Essential hypertension: Patient will continue on lisinopril Lopressor and/or and HCTZ.  DISCHARGE CONDITIONS AND DIET:  Heart healthy Stable  CONSULTS OBTAINED:    [facility] cardiology  DRUG ALLERGIES:   Allergies  Allergen Reactions  . Levaquin [Levofloxacin] Other (See Comments)    Pt states she have nose bleeds and pain in arms and shoulders.    DISCHARGE MEDICATIONS:   Current Discharge Medication List    START taking these medications   Details  apixaban (ELIQUIS) 5 MG TABS tablet Take 1 tablet (5 mg total) by mouth 2 (two) times daily. Qty: 60 tablet, Refills: 0      CONTINUE these medications which have NOT CHANGED   Details  albuterol (PROVENTIL HFA;VENTOLIN HFA) 108 (90 BASE) MCG/ACT inhaler Inhale 1-2 puffs into the lungs every 6 (six) hours as needed for wheezing or shortness of breath (pneumonia).    atorvastatin (LIPITOR) 80 MG tablet Take 80 mg by mouth daily.    FLUoxetine (PROZAC) 20 MG capsule Take 20 mg by mouth daily.    hydrochlorothiazide (HYDRODIURIL) 25 MG tablet Take 25 mg by mouth daily.    isosorbide mononitrate (IMDUR) 60 MG 24 hr tablet Take 60 mg by mouth daily.    lisinopril (PRINIVIL,ZESTRIL) 20 MG tablet Take 20 mg by mouth daily.    metoprolol succinate (TOPROL-XL) 100  MG 24 hr tablet Take 100 mg by mouth daily. Take with or immediately following a meal.    omeprazole (PRILOSEC) 20 MG capsule Take 20 mg by mouth daily.      STOP taking these medications     aspirin EC 81 MG tablet      clopidogrel (PLAVIX) 75 MG tablet               Today   CHIEF COMPLAINT:  Patient is feeling great this morning. She has no complaints. She is in normal sinus rhythm. She  denies any palpitations, chest pain.   VITAL SIGNS:  Blood pressure 125/52, pulse 58, temperature 98.4 F (36.9 C), temperature source Oral, resp. rate 17, height 5' (1.524 m), weight 70.6 kg (155 lb 10.3 oz), SpO2 96 %.   REVIEW OF SYSTEMS:  Review of Systems  Constitutional: Negative for fever.  HENT: Negative for hearing loss.   Eyes: Negative for blurred vision.  Respiratory: Negative for cough.   Cardiovascular: Negative for chest pain, palpitations and orthopnea.  Gastrointestinal: Negative for heartburn, nausea and vomiting.  Musculoskeletal: Negative for myalgias.  Skin: Negative for rash.  Neurological: Negative for dizziness, tingling and headaches.  All other systems reviewed and are negative.    PHYSICAL EXAMINATION:  GENERAL:  81 y.o.-year-old patient lying in the bed with no acute distress.  NECK:  Supple, no jugular venous distention. No thyroid enlargement, no tenderness.  LUNGS: Normal breath sounds bilaterally, no wheezing, rales,rhonchi  No use of accessory muscles of respiration.  CARDIOVASCULAR: S1, S2 normal. No murmurs, rubs, or gallops.  ABDOMEN: Soft, non-tender, non-distended. Bowel sounds present. No organomegaly or mass.  EXTREMITIES: No pedal edema, cyanosis, or clubbing.  PSYCHIATRIC: The patient is alert and oriented x 3.  SKIN: No obvious rash, lesion, or ulcer.   DATA REVIEW:   CBC  Recent Labs Lab 06/08/14 0203  WBC 8.0  HGB 13.0  HCT 38.3  PLT 289    Chemistries   Recent Labs Lab 06/07/14 1605 06/07/14 1940 06/08/14 0203  NA 137  --  139  K 3.3*  --  4.1  CL 102  --  106  CO2 27  --  26  GLUCOSE 136*  --  104*  BUN 25*  --  21*  CREATININE 1.17*  --  0.94  CALCIUM 9.5  --  9.1  MG  --  1.7  --   AST 28  --   --   ALT 17  --   --   ALKPHOS 78  --   --   BILITOT 0.5  --   --     Cardiac Enzymes  Recent Labs Lab 06/07/14 1605 06/08/14 0203 06/08/14 0714  TROPONINI 0.04* 0.10* 0.05*    Microbiology Results   Results for orders placed or performed in visit on 03/20/13  Influenza A&B Antigens [facility])     Status: None   Collection Time: 03/20/13  7:34 AM  Result Value Ref Range Status   Micro Text Report   Final       COMMENT                   NEGATIVE FOR INFLUENZA A (ANTIGEN ABSENT)   COMMENT                   NEGATIVE FOR INFLUENZA B (ANTIGEN ABSENT)   ANTIBIOTIC  RADIOLOGY:  Dg Chest 1 View  06/07/2014   CLINICAL DATA:  Dyspnea.  Tachycardia today.  EXAM: CHEST  1 VIEW  COMPARISON:  03/20/2013.  FINDINGS: The cardiac silhouette remains borderline enlarged. Clear lungs with normal vascularity. Diffuse osteopenia.  IMPRESSION: No acute abnormality.   Electronically Signed   By: Claudie Revering M.D.   On: 06/07/2014 16:53      Management plans discussed with the patient and she is in agreement. Stable for discharge home Patient will need to follow-up with Dr. Lorinda Creed early next week.  CODE STATUS:     Code Status Orders        Start     Ordered   06/07/14 1932  Full code   Continuous     06/07/14 1931    Advance Directive Documentation        Most Recent Value   Type of Advance Directive  Healthcare Power of Attorney, Living will   Pre-existing out of facility DNR order (yellow form or pink MOST form)     "MOST" Form in Place?        TOTAL TIME TAKING CARE OF THIS PATIENT: 35 minutes.    Kayela Humphres M.D on 06/08/2014 at 11:45 AM  Between 7am to 6pm - Pager - [phone] After 6pm go to [email or web address] - password [email or web address] [facility]  Office  [phone]  CC: Primary care physician; Corey Skains, MD

## 2014-06-08 NOTE — Discharge Instructions (Signed)
Start taking your Eliquis tomorrow, 06/09/2014   Atrial Fibrillation Atrial fibrillation is a condition that causes your heart to beat irregularly. It may also cause your heart to beat faster than normal. Atrial fibrillation can prevent your heart from pumping blood normally. It increases your risk of stroke and heart problems. HOME CARE  Take medications as told by your doctor.  Only take medications that your doctor says are safe. Some medications can make the condition worse or happen again.  If blood thinners were prescribed by your doctor, take them exactly as told. Too much can cause bleeding. Too little and you will not have the needed protection against stroke and other problems.  Perform blood tests at home if told by your doctor.  Perform blood tests exactly as told by your doctor.  Do not drink alcohol.  Do not drink beverages with caffeine such as coffee, soda, and some teas.  Maintain a healthy weight.  Do not use diet pills unless your doctor says they are safe. They may make heart problems worse.  Follow diet instructions as told by your doctor.  Exercise regularly as told by your doctor.  Keep all follow-up appointments. GET HELP IF:  You notice a change in the speed, rhythm, or strength of your heartbeat.  You suddenly begin peeing (urinating) more often.  You get tired more easily when moving or exercising. GET HELP RIGHT AWAY IF:   You have chest or belly (abdominal) pain.  You feel sick to your stomach (nauseous).  You are short of breath.  You suddenly have swollen feet and ankles.  You feel dizzy.  You face, arms, or legs feel numb or weak.  There is a change in your vision or speech. MAKE SURE YOU:   Understand these instructions.  Will watch your condition.  Will get help right away if you are not doing well or get worse. Document Released: 10/27/2007 Document Revised: 06/03/2013 Document Reviewed: 02/28/2012 [facility] Patient  Information 2015 [place], [place]. This information is not intended to replace advice given to you by your health care provider. Make sure you discuss any questions you have with your health care provider.

## 2014-06-08 NOTE — Progress Notes (Signed)
ANTICOAGULATION CONSULT NOTE - Initial Consult  Pharmacy Consult for IV Heparin Indication: atrial fibrillation  Allergies  Allergen Reactions  . Levaquin [Levofloxacin] Other (See Comments)    Pt states she have nose bleeds and pain in arms and shoulders.    Patient Measurements: Height: 5' (152.4 cm) Weight: 139 lb (63.05 kg) IBW/kg (Calculated) : 45.5 Heparin Dosing Weight: 50.4kg  Vital Signs: Temp: 98.4 F (36.9 C) (05/08 0400) Temp Source: Oral (05/08 0400) BP: 119/45 mmHg (05/08 0500) Pulse Rate: 64 (05/08 0500)  Labs:  Recent Labs  06/07/14 1605 06/08/14 0203  HGB 14.4 13.0  HCT 43.0 38.3  PLT 312 289  APTT 27  --   LABPROT 12.7  --   INR 0.93  --   HEPARINUNFRC  --  0.29*  CREATININE 1.17* 0.94  TROPONINI 0.04* 0.10*    Estimated Creatinine Clearance: 37.6 mL/min (by C-G formula based on Cr of 0.94).   Medical History: Past Medical History  Diagnosis Date  . Stroke   . Hypertension     Medications:  Prescriptions prior to admission  Medication Sig Dispense Refill Last Dose  . albuterol (PROVENTIL HFA;VENTOLIN HFA) 108 (90 BASE) MCG/ACT inhaler Inhale 1-2 puffs into the lungs every 6 (six) hours as needed for wheezing or shortness of breath (pneumonia).   prn at prn  . aspirin EC 81 MG tablet Take 81 mg by mouth.   06/07/2014 at 1000  . atorvastatin (LIPITOR) 80 MG tablet Take 80 mg by mouth daily.   06/07/2014 at 1000  . clopidogrel (PLAVIX) 75 MG tablet Take 75 mg by mouth daily.   06/07/2014 at 1000  . FLUoxetine (PROZAC) 20 MG capsule Take 20 mg by mouth daily.   06/07/2014 at 1000  . hydrochlorothiazide (HYDRODIURIL) 25 MG tablet Take 25 mg by mouth daily.   06/07/2014 at 1000  . isosorbide mononitrate (IMDUR) 60 MG 24 hr tablet Take 60 mg by mouth daily.   06/07/2014 at 1000  . lisinopril (PRINIVIL,ZESTRIL) 20 MG tablet Take 20 mg by mouth daily.   06/07/2014 at 1000  . metoprolol succinate (TOPROL-XL) 100 MG 24 hr tablet Take 100 mg by mouth daily. Take  with or immediately following a meal.   06/07/2014 at 1000  . omeprazole (PRILOSEC) 20 MG capsule Take 20 mg by mouth daily.   06/07/2014 at 1000   Scheduled:  . aspirin EC  81 mg Oral Daily  . atorvastatin  80 mg Oral Daily  . clopidogrel  75 mg Oral Daily  . docusate sodium  100 mg Oral BID  . FLUoxetine  20 mg Oral Daily  . isosorbide mononitrate  60 mg Oral Daily  . lisinopril  20 mg Oral Daily  . metoprolol succinate  100 mg Oral Daily  . pantoprazole  40 mg Oral Daily   Infusions:  . diltiazem (CARDIZEM) infusion Stopped (06/07/14 2230)  . heparin      Assessment: 17 yoF with hx A-fib and cardiac stents x4 now in A-fib with RVR.  IV Heparin per Rx. Goal of Therapy:  Heparin level 0.3-0.7 units/ml Monitor platelets by anticoagulation protocol: Yes   Plan:   Baseline aPtt stat  Hepairn 2500 unit bolus x1  Start drip @ 700 units/hr  Daily CBC/HL  Check 1st HL in 8 hours  5/8 02:00 Anti-Xa 0.29. 750 unit bolus and increase rate to 800 units/hr. Recheck at 13:00.  Juli Odom S 06/08/2014,5:05 AM

## 2014-06-10 DIAGNOSIS — I482 Chronic atrial fibrillation: Secondary | ICD-10-CM | POA: Diagnosis not present

## 2014-06-10 DIAGNOSIS — I6350 Cerebral infarction due to unspecified occlusion or stenosis of unspecified cerebral artery: Secondary | ICD-10-CM | POA: Diagnosis not present

## 2014-06-10 DIAGNOSIS — I25118 Atherosclerotic heart disease of native coronary artery with other forms of angina pectoris: Secondary | ICD-10-CM | POA: Diagnosis not present

## 2014-06-10 DIAGNOSIS — I10 Essential (primary) hypertension: Secondary | ICD-10-CM | POA: Diagnosis not present

## 2014-07-14 ENCOUNTER — Other Ambulatory Visit: Payer: Self-pay

## 2014-07-14 MED ORDER — LISINOPRIL 20 MG PO TABS: 20.0000 mg | ORAL_TABLET | Freq: Every day | ORAL | Status: DC

## 2014-07-16 ENCOUNTER — Telehealth: Payer: Self-pay

## 2014-07-16 MED ORDER — LISINOPRIL-HYDROCHLOROTHIAZIDE 20-25 MG PO TABS: 1.0000 | ORAL_TABLET | Freq: Every day | ORAL | Status: DC

## 2014-07-16 NOTE — Telephone Encounter (Signed)
We got an order for a med refill for Lisinopril 20mg , it was refilled. Prior notes from cardio and hospital state that was what they had on file. Today we got a fax back from the pharmacy stating she has been on Lisinopril HCT 20/25. I'm not sure which she should be on?

## 2014-07-16 NOTE — Telephone Encounter (Signed)
I spoke with pharmacist, she has not been taking lisinopril/hctz along with a separate hctz. She has only been getting Lisinopril/HCTZ 20/25.

## 2014-07-16 NOTE — Telephone Encounter (Signed)
Please verify with sister and/or patient that she should be on  We had in [facility] that she was on lisinopril/hctz 20/25 I will put in new Rx for lisinopril/hctz

## 2014-07-16 NOTE — Telephone Encounter (Signed)
HOLD that We have HCTZ in the chart separately Please verify; I can't have her on plain HCTZ plus combo

## 2014-09-02 ENCOUNTER — Other Ambulatory Visit: Payer: Self-pay | Admitting: Family Medicine

## 2014-09-02 MED ORDER — LISINOPRIL-HYDROCHLOROTHIAZIDE 20-25 MG PO TABS: 1.0000 | ORAL_TABLET | Freq: Every day | ORAL | Status: DC

## 2014-09-02 NOTE — Telephone Encounter (Signed)
E-fax came through for refill fron [facility] Rx Script: Rx: lisinopril-hydrochlorothiazide (PRINZIDE,ZESTORETIC) 20-25 MG per tablet Copy in basket.

## 2014-09-02 NOTE — Telephone Encounter (Signed)
She needs a new rx for a 90 day supply sent to her pharm.

## 2014-09-12 ENCOUNTER — Telehealth: Payer: Self-pay | Admitting: Family Medicine

## 2014-09-12 MED ORDER — METOPROLOL SUCCINATE ER 100 MG PO TB24: 100.0000 mg | ORAL_TABLET | Freq: Every day | ORAL | Status: DC

## 2014-09-12 NOTE — Telephone Encounter (Signed)
E-fax came through for refill on: Rx: metoprolol succinate (TOPROL-XL) 100 MG 24 hr tablet Copy in basket.

## 2014-09-12 NOTE — Telephone Encounter (Signed)
This is your patient.

## 2014-09-12 NOTE — Telephone Encounter (Signed)
Patient last seen 05/26/14. [facility]

## 2014-09-13 ENCOUNTER — Other Ambulatory Visit: Payer: Self-pay | Admitting: Family Medicine

## 2014-09-13 NOTE — Telephone Encounter (Signed)
SGPT was 17 in May per hospital labs Discharge summary instructions included atorvastatin 80 mg Rx approved

## 2014-10-02 DIAGNOSIS — I482 Chronic atrial fibrillation: Secondary | ICD-10-CM | POA: Diagnosis not present

## 2014-10-02 DIAGNOSIS — I25118 Atherosclerotic heart disease of native coronary artery with other forms of angina pectoris: Secondary | ICD-10-CM | POA: Diagnosis not present

## 2014-10-02 DIAGNOSIS — R0602 Shortness of breath: Secondary | ICD-10-CM | POA: Insufficient documentation

## 2014-10-02 DIAGNOSIS — I10 Essential (primary) hypertension: Secondary | ICD-10-CM | POA: Diagnosis not present

## 2014-10-02 DIAGNOSIS — I2510 Atherosclerotic heart disease of native coronary artery without angina pectoris: Secondary | ICD-10-CM | POA: Diagnosis not present

## 2014-10-21 DIAGNOSIS — R079 Chest pain, unspecified: Secondary | ICD-10-CM | POA: Diagnosis not present

## 2014-10-21 DIAGNOSIS — I25118 Atherosclerotic heart disease of native coronary artery with other forms of angina pectoris: Secondary | ICD-10-CM | POA: Diagnosis not present

## 2014-10-21 DIAGNOSIS — R0602 Shortness of breath: Secondary | ICD-10-CM | POA: Diagnosis not present

## 2014-10-30 DIAGNOSIS — R079 Chest pain, unspecified: Secondary | ICD-10-CM | POA: Diagnosis not present

## 2014-10-30 DIAGNOSIS — R0602 Shortness of breath: Secondary | ICD-10-CM | POA: Diagnosis not present

## 2014-10-30 DIAGNOSIS — I2510 Atherosclerotic heart disease of native coronary artery without angina pectoris: Secondary | ICD-10-CM | POA: Diagnosis not present

## 2014-10-30 DIAGNOSIS — I25118 Atherosclerotic heart disease of native coronary artery with other forms of angina pectoris: Secondary | ICD-10-CM | POA: Diagnosis not present

## 2014-10-30 DIAGNOSIS — I482 Chronic atrial fibrillation: Secondary | ICD-10-CM | POA: Diagnosis not present

## 2014-10-30 DIAGNOSIS — I6350 Cerebral infarction due to unspecified occlusion or stenosis of unspecified cerebral artery: Secondary | ICD-10-CM | POA: Diagnosis not present

## 2014-10-30 DIAGNOSIS — E780 Pure hypercholesterolemia: Secondary | ICD-10-CM | POA: Diagnosis not present

## 2014-10-30 DIAGNOSIS — I10 Essential (primary) hypertension: Secondary | ICD-10-CM | POA: Diagnosis not present

## 2014-11-12 DIAGNOSIS — Z23 Encounter for immunization: Secondary | ICD-10-CM | POA: Diagnosis not present

## 2015-01-14 ENCOUNTER — Other Ambulatory Visit: Payer: Self-pay | Admitting: Family Medicine

## 2015-01-14 NOTE — Telephone Encounter (Signed)
RX was written on 09/13/14 for a qty. Of 90 with 1 refill.

## 2015-01-29 DIAGNOSIS — R0602 Shortness of breath: Secondary | ICD-10-CM | POA: Diagnosis not present

## 2015-01-29 DIAGNOSIS — I482 Chronic atrial fibrillation: Secondary | ICD-10-CM | POA: Diagnosis not present

## 2015-01-29 DIAGNOSIS — R079 Chest pain, unspecified: Secondary | ICD-10-CM | POA: Diagnosis not present

## 2015-01-29 DIAGNOSIS — E7800 Pure hypercholesterolemia, unspecified: Secondary | ICD-10-CM | POA: Diagnosis not present

## 2015-01-29 DIAGNOSIS — I6350 Cerebral infarction due to unspecified occlusion or stenosis of unspecified cerebral artery: Secondary | ICD-10-CM | POA: Diagnosis not present

## 2015-01-29 DIAGNOSIS — I25118 Atherosclerotic heart disease of native coronary artery with other forms of angina pectoris: Secondary | ICD-10-CM | POA: Diagnosis not present

## 2015-01-29 DIAGNOSIS — I10 Essential (primary) hypertension: Secondary | ICD-10-CM | POA: Diagnosis not present

## 2015-01-29 DIAGNOSIS — I2510 Atherosclerotic heart disease of native coronary artery without angina pectoris: Secondary | ICD-10-CM | POA: Diagnosis not present

## 2015-04-02 ENCOUNTER — Other Ambulatory Visit: Payer: Self-pay | Admitting: Family Medicine

## 2015-04-03 NOTE — Telephone Encounter (Signed)
Left detailed message for his nurse to call me in regards to her meds.

## 2015-04-03 NOTE — Telephone Encounter (Signed)
We've not seen the patient since April 2016; she has seen her cardiologist, Dr. Saralyn Pilar most recently and our med list isn't exactly the same as his med list AMY-- Please call Dr. Saralyn Pilar' office and see if they are willing to take over these medicines (beta-blocker, long-acting nitrate, and chol medicine) which seem appropriate to be prescribed and managed by cardiologist; we would greatly appreciate it Let them know we've not seen the patient since April 2016 Patient should be seen here anyway, so please offer appt with Dr. Wynetta Emery if she is interested in seeing her since she has already seen her in the past

## 2015-04-07 NOTE — Telephone Encounter (Signed)
I checked care everywhere notes, Dr. Josefa Half did refill her meds.

## 2015-04-09 ENCOUNTER — Encounter: Payer: Self-pay | Admitting: *Deleted

## 2015-07-23 ENCOUNTER — Other Ambulatory Visit: Payer: Self-pay | Admitting: Family Medicine

## 2015-07-23 NOTE — Telephone Encounter (Signed)
Last seen at [facility] April 2016; I have no indication that she has followed me to [facility]; routing to [facility] staff to contact her to see if she's going to see one of their providers

## 2015-07-24 NOTE — Telephone Encounter (Signed)
Thank you. I look forward to seeing her, since it's been so long. Rx sent as requested.

## 2015-07-24 NOTE — Telephone Encounter (Signed)
Your patient 

## 2015-07-24 NOTE — Telephone Encounter (Signed)
Sorry, but my understanding is that if she is not following me to [facility], then she remains a [facility] patient for disposition and refill purposes; I have left [employer]

## 2015-07-24 NOTE — Telephone Encounter (Signed)
Called and spoke with Samantha [name] and she advised that she would be following Dr. Sanda Klein to [facility] and that she will remain as her PCP. I advised Samantha [name] that I would reroute her refill request to Dr. Sanda Klein for approval.

## 2015-07-24 NOTE — Telephone Encounter (Signed)
Patient has also seen you in the past.

## 2015-08-24 ENCOUNTER — Other Ambulatory Visit: Payer: Self-pay | Admitting: Family Medicine

## 2015-08-25 ENCOUNTER — Telehealth: Payer: Self-pay | Admitting: Family Medicine

## 2015-08-25 MED ORDER — METOPROLOL SUCCINATE ER 100 MG PO TB24: 100.0000 mg | ORAL_TABLET | Freq: Every day | ORAL | 0 refills | Status: DC

## 2015-08-25 NOTE — Telephone Encounter (Signed)
She absolutely needs an appointment for any further refills; I love her dearly, but I've not seen her in over a year, and I really need to see her; if she's not able to get in, perhaps she can get refills from her heart doctor or another provider she sees

## 2015-08-25 NOTE — Telephone Encounter (Signed)
Pt informed and is unable to get here so she will be calling her heart doctor

## 2015-09-09 ENCOUNTER — Other Ambulatory Visit: Payer: Self-pay | Admitting: Family Medicine

## 2015-09-09 NOTE — Telephone Encounter (Signed)
She has an appt 09/22/15; Rxs approved, but she will need to keep that appt for further refills

## 2015-09-18 NOTE — Telephone Encounter (Signed)
done

## 2015-09-22 ENCOUNTER — Ambulatory Visit
Admission: RE | Admit: 2015-09-22 | Discharge: 2015-09-22 | Disposition: A | Discharge status: Home / Self Care | Payer: Medicare Other | Source: Ambulatory Visit | Attending: Family Medicine | Admitting: Family Medicine

## 2015-09-22 ENCOUNTER — Encounter: Payer: Self-pay | Admitting: Family Medicine

## 2015-09-22 ENCOUNTER — Ambulatory Visit (INDEPENDENT_AMBULATORY_CARE_PROVIDER_SITE_OTHER): Payer: Medicare Other | Admitting: Family Medicine

## 2015-09-22 VITALS — BP 118/80 | HR 79 | Temp 98.8°F | Resp 14 | Wt 145.0 lb

## 2015-09-22 DIAGNOSIS — F329 Major depressive disorder, single episode, unspecified: Secondary | ICD-10-CM

## 2015-09-22 DIAGNOSIS — F32A Depression, unspecified: Secondary | ICD-10-CM

## 2015-09-22 DIAGNOSIS — I25111 Atherosclerotic heart disease of native coronary artery with angina pectoris with documented spasm: Secondary | ICD-10-CM | POA: Diagnosis not present

## 2015-09-22 DIAGNOSIS — M19042 Primary osteoarthritis, left hand: Secondary | ICD-10-CM | POA: Diagnosis not present

## 2015-09-22 DIAGNOSIS — M25542 Pain in joints of left hand: Secondary | ICD-10-CM | POA: Diagnosis not present

## 2015-09-22 DIAGNOSIS — M85841 Other specified disorders of bone density and structure, right hand: Secondary | ICD-10-CM | POA: Diagnosis not present

## 2015-09-22 DIAGNOSIS — I482 Chronic atrial fibrillation, unspecified: Secondary | ICD-10-CM

## 2015-09-22 DIAGNOSIS — R5383 Other fatigue: Secondary | ICD-10-CM | POA: Insufficient documentation

## 2015-09-22 DIAGNOSIS — R51 Headache: Secondary | ICD-10-CM

## 2015-09-22 DIAGNOSIS — M85842 Other specified disorders of bone density and structure, left hand: Secondary | ICD-10-CM | POA: Insufficient documentation

## 2015-09-22 DIAGNOSIS — M79641 Pain in right hand: Secondary | ICD-10-CM | POA: Diagnosis present

## 2015-09-22 DIAGNOSIS — I10 Essential (primary) hypertension: Secondary | ICD-10-CM

## 2015-09-22 DIAGNOSIS — Z5181 Encounter for therapeutic drug level monitoring: Secondary | ICD-10-CM

## 2015-09-22 DIAGNOSIS — R519 Headache, unspecified: Secondary | ICD-10-CM

## 2015-09-22 DIAGNOSIS — M25541 Pain in joints of right hand: Secondary | ICD-10-CM

## 2015-09-22 DIAGNOSIS — M79642 Pain in left hand: Secondary | ICD-10-CM | POA: Diagnosis present

## 2015-09-22 DIAGNOSIS — M1811 Unilateral primary osteoarthritis of first carpometacarpal joint, right hand: Secondary | ICD-10-CM | POA: Diagnosis not present

## 2015-09-22 LAB — COMPREHENSIVE METABOLIC PANEL
ALT: 26 U/L (ref 6–29)
AST: 29 U/L (ref 10–35)
Albumin: 3.9 g/dL (ref 3.6–5.1)
Alkaline Phosphatase: 106 U/L (ref 33–130)
BUN: 17 mg/dL (ref 7–25)
CALCIUM: 9.6 mg/dL (ref 8.6–10.4)
CHLORIDE: 100 mmol/L (ref 98–110)
CO2: 31 mmol/L (ref 20–31)
Creat: 0.78 mg/dL (ref 0.60–0.88)
GLUCOSE: 110 mg/dL — AB (ref 65–99)
POTASSIUM: 4.6 mmol/L (ref 3.5–5.3)
Sodium: 140 mmol/L (ref 135–146)
Total Bilirubin: 0.5 mg/dL (ref 0.2–1.2)
Total Protein: 6.7 g/dL (ref 6.1–8.1)

## 2015-09-22 LAB — CBC WITH DIFFERENTIAL/PLATELET
BASOS ABS: 0 {cells}/uL (ref 0–200)
Basophils Relative: 0 %
EOS PCT: 4 %
Eosinophils Absolute: 288 cells/uL (ref 15–500)
HCT: 42.7 % (ref 35.0–45.0)
Hemoglobin: 14.2 g/dL (ref 11.7–15.5)
LYMPHS PCT: 33 %
Lymphs Abs: 2376 cells/uL (ref 850–3900)
MCH: 31 pg (ref 27.0–33.0)
MCHC: 33.3 g/dL (ref 32.0–36.0)
MCV: 93.2 fL (ref 80.0–100.0)
MONOS PCT: 8 %
MPV: 10.8 fL (ref 7.5–12.5)
Monocytes Absolute: 576 cells/uL (ref 200–950)
NEUTROS ABS: 3960 {cells}/uL (ref 1500–7800)
NEUTROS PCT: 55 %
PLATELETS: 277 10*3/uL (ref 140–400)
RBC: 4.58 MIL/uL (ref 3.80–5.10)
RDW: 13.3 % (ref 11.0–15.0)
WBC: 7.2 10*3/uL (ref 3.8–10.8)

## 2015-09-22 LAB — URIC ACID: Uric Acid, Serum: 7.1 mg/dL — ABNORMAL HIGH (ref 2.5–7.0)

## 2015-09-22 LAB — C-REACTIVE PROTEIN: CRP: <0.5 mg/dL (ref <0.60)

## 2015-09-22 MED ORDER — VORTIOXETINE HBR 5 MG PO TABS: ORAL_TABLET | ORAL | 1 refills | Status: DC

## 2015-09-22 NOTE — Assessment & Plan Note (Addendum)
Followed by Dr. Saralyn Pilar; he follows her cholesterol, chest pain, blood thinner

## 2015-09-22 NOTE — Assessment & Plan Note (Addendum)
Check ANA, CRP, xrays; try turmeric, tylenol, topicals; if RA, will refer to rheum; if not, willing to try tramadol or something else mild; I would not be in favor of putting her on NSAID, with her advanced age and being on Eliquis

## 2015-09-22 NOTE — Progress Notes (Signed)
BP 118/80   Pulse 79   Temp 98.8 F (37.1 C) (Oral)   Resp 14   Wt 145 lb (65.8 kg)   SpO2 96%   BMI 28.32 kg/m    Subjective:    Patient ID: Samantha [name], female    DOB: 1928-05-17, 82 y.o.   MRN: [id number]  HPI: Samantha [name] is a 82 y.o. female  Chief Complaint  Patient presents with  . Hand Pain    bilateral   . Headache    shooting pain   She is seen now for follow-up, has not been seen in over a year  Her hands are crippling up; her right hand is really bad; throbbing pain all the time; can't bring her right thumb to the pinky; can't use mouse on her computer; hand goes numb, completely numb; can't use can opener, can't hold cup of coffee  She also has a headache; she has started to have pain from the right ear to this area right here, as she points to the right temple, like a drill or knife or sticks her in her head; you can feel a knot on the head; she has had this pain over many years; it has gotten worse, "a lot worse"; she feels it starting in her ear and doesn't know what to do, "it hurts so bad"; they did an MRI on her head at [facility], but she can't find it; he showed her a place that they found that is black and they didn't know what it was and wanted her to come back in a year to have another MRI to see if it had expanded; she wonders if it's the same spot; it has her concerned; it was in 2011; CareEverywhere shows neurologist Dr. Lonzo Cloud  She has atrial fibrillation and coronary artery disease; she sees Dr. Saralyn Pilar and has occasional chest pain; taking Eliquis for blood thinner  She has trouble falling asleep until 3 am; then she'll sleep until noon; she doesn't want to take sleeping pills; has tried no TV, eating early, hot baths  Depression screen [facility] 2/9 09/22/2015  Decreased Interest 3  Down, Depressed, Hopeless 3  PHQ - 2 Score 6  Altered sleeping 3  Tired, decreased energy 3  Change in appetite 1  Feeling bad or failure about yourself  3    Trouble concentrating 0  Moving slowly or fidgety/restless 1  Suicidal thoughts 3  PHQ-9 Score 20  Difficult doing work/chores Extremely dIfficult  she voices passive ideation, if she didn't wake up, it would be okay; ready for the Lord to take her; no thoughts at all about self-harm; would not hurt herself or anyone else; just tired and feeling old; seemed comfortable with the thought that she is aging and her time is nearing its end; she again verifies that she has no active plans to harm herself, would not hurt herself, just tired of life and feeling old and ready to go  Relevant past medical, surgical, family and social history reviewed Past Medical History:  Diagnosis Date  . Hypertension   . Stroke [facility])    Past Surgical History:  Procedure Laterality Date  . ABDOMINAL HYSTERECTOMY    . APPENDECTOMY    . CHOLECYSTECTOMY    . FRACTURE SURGERY     No family history on file. Social History  Substance Use Topics  . Smoking status: Never Smoker  . Smokeless tobacco: Not on file  . Alcohol use No   Interim medical  history since last visit reviewed. Allergies and medications reviewed  Review of Systems Per HPI unless specifically indicated above     Objective:    BP 118/80   Pulse 79   Temp 98.8 F (37.1 C) (Oral)   Resp 14   Wt 145 lb (65.8 kg)   SpO2 96%   BMI 28.32 kg/m   Wt Readings from Last 3 Encounters:  09/22/15 145 lb (65.8 kg)  06/08/14 155 lb 10.3 oz (70.6 kg)  06/05/14 143 lb (64.9 kg)    Physical Exam  Constitutional: She appears well-developed and well-nourished. No distress.  HENT:  Right Ear: Tympanic membrane, external ear and ear canal normal.  Left Ear: Hearing, tympanic membrane, external ear and ear canal normal.  Nose: No rhinorrhea.  Mouth/Throat: Oropharynx is clear and moist.  No tenderness over the temporal arteries; no tenderness over the TMJs  Eyes: EOM are normal. No scleral icterus.  Neck: No thyromegaly present.   Cardiovascular: Normal rate.  An irregularly irregular rhythm present.  Pulmonary/Chest: Effort normal.  Abdominal: She exhibits no distension.  Musculoskeletal:       Right hand: She exhibits decreased range of motion, tenderness, bony tenderness and deformity.       Left hand: She exhibits decreased range of motion, tenderness, bony tenderness and deformity.  Neurological: She is alert. She displays no tremor.  Hesitating voice as before  Skin: Skin is warm. She is not diaphoretic. No pallor.  Psychiatric: She has a normal mood and affect. Her behavior is normal. Judgment and thought content normal. Her mood appears not anxious. Her affect is not blunt. Her speech is not delayed. She is not aggressive, not slowed and not withdrawn. Cognition and memory are not impaired. She does not express impulsivity or inappropriate judgment. She does not exhibit a depressed mood. She expresses no homicidal and no suicidal ideation.  Very good eye contact with examiner; pleasant, neatly dressed, wearing make-up; hair is done up; good hygiene; not despondent She is attentive.       Assessment & Plan:   Problem List Items Addressed This Visit      Cardiovascular and Mediastinum   HTN (hypertension)    Well-controlled today      CAD (coronary artery disease)    Followed by Dr. Saralyn Pilar; he follows her cholesterol, chest pain, blood thinner      A-fib ([place])    Noted on exam today; she is on Eliquis; managed by Dr. Saralyn Pilar        Other   Medication monitoring encounter    Check labs      Relevant Orders   Comprehensive metabolic panel (Completed)   Headache    Refer back to [facility] neurology; patient reports hx of abnormal brain MRI and the need to f/u but she did not go back; check ESR to r/o temporal arteritis      Relevant Medications   vortioxetine HBr (TRINTELLIX) 5 MG TABS   Other Relevant Orders   Sed Rate (ESR) (Completed)   Ambulatory referral to Neurology   Fatigue     Check labs      Relevant Orders   TSH (Completed)   Depression    Patient verifies no thoughts of self-harm or harming others; passively feeling old, comfortable with approaching end of years; however, with her fatigue, will check labs to r/o anemia, thyroid disease as contributing factors; will stop escitalopram and start Trintellix; patient promises to f/u closely and contact me with any changes for the  worse in her mood; no psychosis, no red flags at this time, can treat as outpatient; she has family support as well      Relevant Medications   vortioxetine HBr (TRINTELLIX) 5 MG TABS   Arthralgia of both hands - Primary    Check ANA, CRP, xrays; try turmeric, tylenol, topicals; if RA, will refer to rheum; if not, willing to try tramadol or something else mild; I would not be in favor of putting her on NSAID, with her advanced age and being on Eliquis      Relevant Orders   CBC with Differential/Platelet (Completed)   Sed Rate (ESR) (Completed)   C-reactive protein (Completed)   ANA,IFA RA Diag Pnl w/rflx Tit/Patn (Completed)   Uric acid (Completed)   DG Hand Complete Left (Completed)   DG Hand Complete Right (Completed)    Other Visit Diagnoses   None.      Follow up plan: Return in about 3 weeks (around 10/13/2015) for follow-up.  An after-visit summary was printed and given to the patient at [place].  Please see the patient instructions which may contain other information and recommendations beyond what is mentioned above in the assessment and plan.  Meds ordered this encounter  Medications  . vortioxetine HBr (TRINTELLIX) 5 MG TABS    Sig: One by mouth daily for mood; this replaces escitalopram    Dispense:  30 tablet    Refill:  1    Stop escitalopram    Orders Placed This Encounter  Procedures  . DG Hand Complete Left  . DG Hand Complete Right  . CBC with Differential/Platelet  . Sed Rate (ESR)  . C-reactive protein  . ANA,IFA RA Diag Pnl w/rflx Tit/Patn  .  Comprehensive metabolic panel  . TSH  . Uric acid  . Ambulatory referral to Neurology

## 2015-09-22 NOTE — Assessment & Plan Note (Addendum)
Refer back to [facility] neurology; patient reports hx of abnormal brain MRI and the need to f/u but she did not go back; check ESR to r/o temporal arteritis

## 2015-09-22 NOTE — Patient Instructions (Addendum)
Let's get labs and xrays today We'll get you back to neurology at [facility]  Try turmeric as a natural anti-inflammatory (for pain and arthritis). It comes in capsules where you buy aspirin and fish oil, but also as a spice where you buy pepper and garlic powder.  Do follow-up with your heart doctor about your heart issues and symptoms  Try melatonin for 3 mg at the exact same time of night 30-60 minutes before bedtime, and move the time back by 15 minutes every week to reset your sleep clock earlier   Arthritis Arthritis means joint pain. It can also mean joint disease. A joint is a place where bones come together. People who have arthritis may have:  Red joints.  Swollen joints.  Stiff joints.  Warm joints.  A fever.  A feeling of being sick. HOME CARE Pay attention to any changes in your symptoms. Take these actions to help with your pain and swelling. Medicines  Take over-the-counter and prescription medicines only as told by your doctor.  Do not take aspirin for pain if your doctor says that you may have gout. Activities  Rest your joint if your doctor tells you to.  Avoid activities that make the pain worse.  Exercise your joint regularly as told by your doctor. Try doing exercises like:  Swimming.  Water aerobics.  Biking.  Walking. Joint Care  If your joint is swollen, keep it raised (elevated) if told by your doctor.  If your joint feels stiff in the morning, try taking a warm shower.  If you have diabetes, do not apply heat without asking your doctor.  If told, apply heat to the joint:  Put a towel between the joint and the hot pack or heating pad.  Leave the heat on the area for 20-30 minutes.  If told, apply ice to the joint:  Put ice in a plastic bag.  Place a towel between your skin and the bag.  Leave the ice on for 20 minutes, 2-3 times per day.  Keep all follow-up visits as told by your doctor. GET HELP IF:  The pain gets  worse.  You have a fever. GET HELP RIGHT AWAY IF:  You have very bad pain in your joint.  You have swelling in your joint.  Your joint is red.  Many joints become painful and swollen.  You have very bad back pain.  Your leg is very weak.  You cannot control your pee (urine) or poop (stool).   This information is not intended to replace advice given to you by your health care provider. Make sure you discuss any questions you have with your health care provider.   Document Released: 04/13/2009 Document Revised: 10/08/2014 Document Reviewed: 04/14/2014 Elsevier Interactive Patient Education 2016 [employer]. Insomnia Insomnia is a sleep disorder that makes it difficult to fall asleep or to stay asleep. Insomnia can cause tiredness (fatigue), low energy, difficulty concentrating, mood swings, and poor performance at work or school.  There are three different ways to classify insomnia:  Difficulty falling asleep.  Difficulty staying asleep.  Waking up too early in the morning. Any type of insomnia can be long-term (chronic) or short-term (acute). Both are common. Short-term insomnia usually lasts for three months or less. Chronic insomnia occurs at least three times a week for longer than three months. CAUSES  Insomnia may be caused by another condition, situation, or substance, such as:  Anxiety.  Certain medicines.  Gastroesophageal reflux disease (GERD) or other gastrointestinal  conditions.  Asthma or other breathing conditions.  Restless legs syndrome, sleep apnea, or other sleep disorders.  Chronic pain.  Menopause. This may include hot flashes.  Stroke.  Abuse of alcohol, tobacco, or illegal drugs.  Depression.  Caffeine.   Neurological disorders, such as Alzheimer disease.  An overactive thyroid (hyperthyroidism). The cause of insomnia may not be known. RISK FACTORS Risk factors for insomnia include:  Gender. Women are more commonly affected than  men.  Age. Insomnia is more common as you get older.  Stress. This may involve your professional or personal life.  Income. Insomnia is more common in people with lower income.  Lack of exercise.   Irregular work schedule or night shifts.  Traveling between different time zones. SIGNS AND SYMPTOMS If you have insomnia, trouble falling asleep or trouble staying asleep is the main symptom. This may lead to other symptoms, such as:  Feeling fatigued.  Feeling nervous about going to sleep.  Not feeling rested in the morning.  Having trouble concentrating.  Feeling irritable, anxious, or depressed. TREATMENT  Treatment for insomnia depends on the cause. If your insomnia is caused by an underlying condition, treatment will focus on addressing the condition. Treatment may also include:   Medicines to help you sleep.  Counseling or therapy.  Lifestyle adjustments. HOME CARE INSTRUCTIONS   Take medicines only as directed by your health care provider.  Keep regular sleeping and waking hours. Avoid naps.  Keep a sleep diary to help you and your health care provider figure out what could be causing your insomnia. Include:   When you sleep.  When you wake up during the night.  How well you sleep.   How rested you feel the next day.  Any side effects of medicines you are taking.  What you eat and drink.   Make your bedroom a comfortable place where it is easy to fall asleep:  Put up shades or special blackout curtains to block light from outside.  Use a white noise machine to block noise.  Keep the temperature cool.   Exercise regularly as directed by your health care provider. Avoid exercising right before bedtime.  Use relaxation techniques to manage stress. Ask your health care provider to suggest some techniques that may work well for you. These may include:  Breathing exercises.  Routines to release muscle tension.  Visualizing peaceful scenes.  Cut  back on alcohol, caffeinated beverages, and cigarettes, especially close to bedtime. These can disrupt your sleep.  Do not overeat or eat spicy foods right before bedtime. This can lead to digestive discomfort that can make it hard for you to sleep.  Limit screen use before bedtime. This includes:  Watching TV.  Using your smartphone, tablet, and computer.  Stick to a routine. This can help you fall asleep faster. Try to do a quiet activity, brush your teeth, and go to bed at the same time each night.  Get out of bed if you are still awake after 15 minutes of trying to sleep. Keep the lights down, but try reading or doing a quiet activity. When you feel sleepy, go back to bed.  Make sure that you drive carefully. Avoid driving if you feel very sleepy.  Keep all follow-up appointments as directed by your health care provider. This is important. SEEK MEDICAL CARE IF:   You are tired throughout the day or have trouble in your daily routine due to sleepiness.  You continue to have sleep problems or your sleep  problems get worse. SEEK IMMEDIATE MEDICAL CARE IF:   You have serious thoughts about hurting yourself or someone else.   This information is not intended to replace advice given to you by your health care provider. Make sure you discuss any questions you have with your health care provider.   Document Released: 01/15/2000 Document Revised: 10/08/2014 Document Reviewed: 10/18/2013 Elsevier Interactive Patient Education [employer].

## 2015-09-22 NOTE — Assessment & Plan Note (Signed)
Check labs 

## 2015-09-23 ENCOUNTER — Telehealth: Payer: Self-pay | Admitting: Family Medicine

## 2015-09-23 ENCOUNTER — Telehealth: Payer: Self-pay

## 2015-09-23 LAB — ANA,IFA RA DIAG PNL W/RFLX TIT/PATN
Anti Nuclear Antibody(ANA): NEGATIVE
Cyclic Citrullin Peptide Ab: <16 Units
Rhuematoid fact SerPl-aCnc: <10 IU/mL (ref <=14)

## 2015-09-23 LAB — TSH: TSH: 3.11 mIU/L

## 2015-09-23 LAB — SEDIMENTATION RATE: SED RATE: 22 mm/h (ref 0–30)

## 2015-09-23 MED ORDER — TRAMADOL HCL 50 MG PO TABS: 25.0000 mg | ORAL_TABLET | Freq: Three times a day (TID) | ORAL | 0 refills | Status: DC | PRN

## 2015-09-23 NOTE — Telephone Encounter (Signed)
I cannot find the release to talk with the sister, so staff is checking on that for me right now

## 2015-09-23 NOTE — Telephone Encounter (Signed)
Notified would have to sign release

## 2015-09-23 NOTE — Telephone Encounter (Signed)
I reviewed the labs No RA by films or labs We discussed the new medicine; she asked about the anti-depressant and she had a question The pharmacist says that he cannot take the two medicines together, stop the escitalopram and start the new medicine I offered tramadol for pain, okay to take with the tylenol Cautions given

## 2015-09-23 NOTE — Telephone Encounter (Signed)
Pt sister Joycelyn Schmid called is concerned about new medication she was given yesterday.  Please call to discuss.

## 2015-09-23 NOTE — Telephone Encounter (Signed)
Please make sure we have clearance to talk to the sister on the chart please and then I'll be glad to talk to her sister if the patient wants me to do so

## 2015-09-24 DIAGNOSIS — F329 Major depressive disorder, single episode, unspecified: Secondary | ICD-10-CM | POA: Insufficient documentation

## 2015-09-24 DIAGNOSIS — F32A Depression, unspecified: Secondary | ICD-10-CM | POA: Insufficient documentation

## 2015-09-24 HISTORY — DX: Depression, unspecified: F32.A

## 2015-09-24 NOTE — Assessment & Plan Note (Signed)
Noted on exam today; she is on Eliquis; managed by Dr. Saralyn Pilar

## 2015-09-24 NOTE — Assessment & Plan Note (Signed)
Patient verifies no thoughts of self-harm or harming others; passively feeling old, comfortable with approaching end of years; however, with her fatigue, will check labs to r/o anemia, thyroid disease as contributing factors; will stop escitalopram and start Trintellix; patient promises to f/u closely and contact me with any changes for the worse in her mood; no psychosis, no red flags at this time, can treat as outpatient; she has family support as well

## 2015-09-24 NOTE — Assessment & Plan Note (Signed)
Well controlled today.

## 2015-09-25 ENCOUNTER — Telehealth: Payer: Self-pay | Admitting: Family Medicine

## 2015-09-25 NOTE — Telephone Encounter (Signed)
It was in the fax tray and I asked staff to fax it today

## 2015-09-30 DIAGNOSIS — M3501 Sicca syndrome with keratoconjunctivitis: Secondary | ICD-10-CM | POA: Diagnosis not present

## 2015-10-07 NOTE — Telephone Encounter (Signed)
done

## 2015-10-12 ENCOUNTER — Other Ambulatory Visit: Payer: Self-pay | Admitting: Family Medicine

## 2015-10-12 NOTE — Telephone Encounter (Signed)
Last CMP reviewed Rx approved 

## 2015-10-16 NOTE — Telephone Encounter (Signed)
DONE

## 2015-10-20 ENCOUNTER — Ambulatory Visit (INDEPENDENT_AMBULATORY_CARE_PROVIDER_SITE_OTHER): Payer: Medicare Other | Admitting: Family Medicine

## 2015-10-20 ENCOUNTER — Encounter: Payer: Self-pay | Admitting: Family Medicine

## 2015-10-20 DIAGNOSIS — F329 Major depressive disorder, single episode, unspecified: Secondary | ICD-10-CM | POA: Diagnosis not present

## 2015-10-20 DIAGNOSIS — F32A Depression, unspecified: Secondary | ICD-10-CM

## 2015-10-20 DIAGNOSIS — R51 Headache: Secondary | ICD-10-CM | POA: Diagnosis not present

## 2015-10-20 DIAGNOSIS — M25541 Pain in joints of right hand: Secondary | ICD-10-CM

## 2015-10-20 DIAGNOSIS — I25111 Atherosclerotic heart disease of native coronary artery with angina pectoris with documented spasm: Secondary | ICD-10-CM | POA: Diagnosis not present

## 2015-10-20 DIAGNOSIS — R519 Headache, unspecified: Secondary | ICD-10-CM

## 2015-10-20 DIAGNOSIS — M25542 Pain in joints of left hand: Secondary | ICD-10-CM | POA: Diagnosis not present

## 2015-10-20 MED ORDER — VORTIOXETINE HBR 10 MG PO TABS: ORAL_TABLET | ORAL | 0 refills | Status: DC

## 2015-10-20 MED ORDER — HYDROCODONE-ACETAMINOPHEN 5-325 MG PO TABS: 0.5000 | ORAL_TABLET | Freq: Three times a day (TID) | ORAL | 0 refills | Status: DC | PRN

## 2015-10-20 NOTE — Progress Notes (Signed)
BP 138/70   Pulse 80   Temp 98 F (36.7 C)   Wt 147 lb 3.2 oz (66.8 kg)   SpO2 96%   BMI 28.75 kg/m    Subjective:    Patient ID: Samantha [name], female    DOB: 1928-06-03, 82 y.o.   MRN: [id number]  HPI: Samantha [name] is a 82 y.o. female  Chief Complaint  Patient presents with  . Follow-up  . Dizziness   She is here for f/u of the depression; PHQ score much improved; she says she is just a worrier Constant worries, problems with finances, problems with her son again There is nothing she can do about her problems She cannot find a solution to her problems She is tolerating the medicine fine She has not started working with a counselor She is not sure that the new medicine is really helping, even though the score is better She has so much she's trying to do, cleaning out her house  The tramadol is causing her dizziness; if she is walking, she feels unsteady; will stop that medicine we discussed; she is having some itching from it too  Her right hand is really hurting; can't open a bottle; can't pick up a glass, feels like she might drop it; pain with gripping; hurts especially across the right thumb and right index finger; right-handed; left hurt too but nothing like that one; she has numbness in the right hand too  She is having terrible pain in her head; knot on her head, she has been to [facility]; she has an appt with them Dec 12th; she would like to see a local neurologist instead of waiting that long; she had a normal sed rate in August, ESR 22; they found a big black place in her MRI and they couldn't determine what it was she says and that she should go back and have another MRI to see if it's increased in size, but she never went back  Depression screen [facility] 2/9 10/20/2015 09/22/2015  Decreased Interest 1 3  Down, Depressed, Hopeless 1 3  PHQ - 2 Score 2 6  Altered sleeping 1 3  Tired, decreased energy 2 3  Change in appetite 0 1  Feeling bad or failure about yourself   1 3  Trouble concentrating 0 0  Moving slowly or fidgety/restless 1 1  Suicidal thoughts 0 3  PHQ-9 Score 7 20  Difficult doing work/chores Very difficult Extremely dIfficult   Relevant past medical, surgical, family and social history reviewed Past Medical History:  Diagnosis Date  . Hypertension   . Stroke [facility])    Past Surgical History:  Procedure Laterality Date  . ABDOMINAL HYSTERECTOMY    . APPENDECTOMY    . CHOLECYSTECTOMY    . FRACTURE SURGERY     No family history on file. Social History  Substance Use Topics  . Smoking status: Never Smoker  . Smokeless tobacco: Not on file  . Alcohol use No   Interim medical history since last visit reviewed. Allergies and medications reviewed  Review of Systems Per HPI unless specifically indicated above     Objective:    BP 138/70   Pulse 80   Temp 98 F (36.7 C)   Wt 147 lb 3.2 oz (66.8 kg)   SpO2 96%   BMI 28.75 kg/m   Wt Readings from Last 3 Encounters:  10/20/15 147 lb 3.2 oz (66.8 kg)  09/22/15 145 lb (65.8 kg)  06/08/14 155 lb 10.3 oz (  70.6 kg)    Physical Exam  Constitutional: She appears well-developed and well-nourished. No distress.  HENT:  Head: Normocephalic and atraumatic. Head is without abrasion, without contusion and without laceration. Hair is normal.  Nose: No rhinorrhea.  Mouth/Throat: Mucous membranes are normal.  Tender along the right side, further back than temporal artery region; no palpable cords  Eyes: EOM are normal. No scleral icterus.  Neck: Neck supple. No JVD present. No thyromegaly present.  Cardiovascular: Normal rate.   Pulmonary/Chest: Effort normal.  Abdominal: She exhibits no distension.  Musculoskeletal:       Right hand: She exhibits decreased range of motion, tenderness and deformity (enlarged MCP 2nd joint right hand).       Left hand: She exhibits decreased range of motion.  Neurological:  Grip strength right 3+/5  Skin: No rash noted. No pallor.  Psychiatric: Her  behavior is normal. Judgment and thought content normal. Her mood appears anxious. Her affect is not blunt. She does not exhibit a depressed mood. She expresses no homicidal and no suicidal ideation.  Good eye contact with examiner   Results for orders placed or performed in visit on 09/22/15  CBC with Differential/Platelet  Result Value Ref Range   WBC 7.2 3.8 - 10.8 K/uL   RBC 4.58 3.80 - 5.10 MIL/uL   Hemoglobin 14.2 11.7 - 15.5 g/dL   HCT 42.7 35.0 - 45.0 %   MCV 93.2 80.0 - 100.0 fL   MCH 31.0 27.0 - 33.0 pg   MCHC 33.3 32.0 - 36.0 g/dL   RDW 13.3 11.0 - 15.0 %   Platelets 277 140 - 400 K/uL   MPV 10.8 7.5 - 12.5 fL   Neutro Abs 3,960 1,500 - 7,800 cells/uL   Lymphs Abs 2,376 850 - 3,900 cells/uL   Monocytes Absolute 576 200 - 950 cells/uL   Eosinophils Absolute 288 15 - 500 cells/uL   Basophils Absolute 0 0 - 200 cells/uL   Neutrophils Relative % 55 %   Lymphocytes Relative 33 %   Monocytes Relative 8 %   Eosinophils Relative 4 %   Basophils Relative 0 %   Smear Review Criteria for review not met   Sed Rate (ESR)  Result Value Ref Range   Sed Rate 22 0 - 30 mm/hr  C-reactive protein  Result Value Ref Range   CRP <0.5 <0.60 mg/dL  ANA,IFA RA Diag Pnl w/rflx Tit/Patn  Result Value Ref Range   Rhuematoid fact SerPl-aCnc <10 <=14 IU/mL   Anit Nuclear Antibody(ANA) NEG NEGATIVE   Cyclic Citrullin Peptide Ab <16 Units  Comprehensive metabolic panel  Result Value Ref Range   Sodium 140 135 - 146 mmol/L   Potassium 4.6 3.5 - 5.3 mmol/L   Chloride 100 98 - 110 mmol/L   CO2 31 20 - 31 mmol/L   Glucose, Bld 110 (H) 65 - 99 mg/dL   BUN 17 7 - 25 mg/dL   Creat 0.78 0.60 - 0.88 mg/dL   Total Bilirubin 0.5 0.2 - 1.2 mg/dL   Alkaline Phosphatase 106 33 - 130 U/L   AST 29 10 - 35 U/L   ALT 26 6 - 29 U/L   Total Protein 6.7 6.1 - 8.1 g/dL   Albumin 3.9 3.6 - 5.1 g/dL   Calcium 9.6 8.6 - 10.4 mg/dL  TSH  Result Value Ref Range   TSH 3.11 mIU/L  Uric acid  Result Value Ref  Range   Uric Acid, Serum 7.1 (H) 2.5 - 7.0 mg/dL  Assessment & Plan:   Problem List Items Addressed This Visit      Other   Headache    With hx of abnormal MRI of brain; we cannot wait until December for her to be seen; new high priority referral entered      Relevant Medications   HYDROcodone-acetaminophen (NORCO/VICODIN) 5-325 MG tablet   vortioxetine HBr (TRINTELLIX) 10 MG TABS   Other Relevant Orders   Ambulatory referral to Neurology   Depression    Encouraged her to work with counselor; Psych Today list given; increase Trintellix to 10 mg and return for close f/u      Relevant Medications   vortioxetine HBr (TRINTELLIX) 10 MG TABS   Arthralgia of both hands    Refer to rheumatologist for her severe pain, limited ROM      Relevant Orders   Ambulatory referral to Orthopedic Surgery    Other Visit Diagnoses   None.     Follow up plan: Return in about 3 weeks (around 11/10/2015) for follow-up.  An after-visit summary was printed and given to the patient at [facility].  Please see the patient instructions which may contain other information and recommendations beyond what is mentioned above in the assessment and plan.  Meds ordered this encounter  Medications  . HYDROcodone-acetaminophen (NORCO/VICODIN) 5-325 MG tablet    Sig: Take 0.5 tablets by mouth every 8 (eight) hours as needed for moderate pain.    Dispense:  20 tablet    Refill:  0  . vortioxetine HBr (TRINTELLIX) 10 MG TABS    Sig: One by mouth daily (this replaces the 5 mg pill)    Dispense:  30 tablet    Refill:  0    Stop the 5 mg pill and we'll go to 10 mg daily    Orders Placed This Encounter  Procedures  . Ambulatory referral to Neurology  . Ambulatory referral to Orthopedic Surgery

## 2015-10-20 NOTE — Assessment & Plan Note (Signed)
Encouraged her to work with counselor; Psych Today list given; increase Trintellix to 10 mg and return for close f/u

## 2015-10-20 NOTE — Assessment & Plan Note (Signed)
Refer to rheumatologist for her severe pain, limited ROM

## 2015-10-20 NOTE — Assessment & Plan Note (Signed)
With hx of abnormal MRI of brain; we cannot wait until December for her to be seen; new high priority referral entered

## 2015-10-20 NOTE — Patient Instructions (Addendum)
STOP the tramadol We'll use hydrocodone/apap for the pain We'll have you see the orthopaedist about the pain in your hands We'll have you see the neurologist about your headaches Increase the Trintellix to 10 mg daily Please do start working with a counselor, and you can call on your own to schedule an appointment Return in 3 weeks, but call sooner if any issues  12 Ways to Curb Anxiety  ?Anxiety is normal human sensation. It is what helped our ancestors survive the pitfalls of the wilderness. Anxiety is defined as experiencing worry or nervousness about an imminent event or something with an uncertain outcome. It is a feeling experienced by most people at some point in their lives. Anxiety can be triggered by a very personal issue, such as the illness of a loved one, or an event of global proportions, such as a refugee crisis. Some of the symptoms of anxiety are:  Feeling restless.  Having a feeling of impending danger.  Increased heart rate.  Rapid breathing. Sweating.  Shaking.  Weakness or feeling tired.  Difficulty concentrating on anything except the current worry.  Insomnia.  Stomach or bowel problems. What can we do about anxiety we may be feeling? There are many techniques to help manage stress and relax. Here are 12 ways you can reduce your anxiety almost immediately: 1. Turn off the constant feed of information. Take a social media sabbatical. Studies have shown that social media directly contributes to social anxiety.  2. Monitor your television viewing habits. Are you watching shows that are also contributing to your anxiety, such as 24-hour news stations? Try watching something else, or better yet, nothing at all. Instead, listen to music, read an inspirational book or practice a hobby. 3. Eat nutritious meals. Also, don't skip meals and keep healthful snacks on hand. Hunger and poor diet contributes to feeling anxious. 4. Sleep. Sleeping on a regular schedule for at least  seven to eight hours a night will do wonders for your outlook when you are awake. 5. Exercise. Regular exercise will help rid your body of that anxious energy and help you get more restful sleep. 6. Try deep (diaphragmatic) breathing. Inhale slowly through your nose for five seconds and exhale through your mouth. 7. Practice acceptance and gratitude. When anxiety hits, accept that there are things out of your control that shouldn't be of immediate concern.  8. Seek out humor. When anxiety strikes, watch a funny video, read jokes or call a friend who makes you laugh. Laughter is healing for our bodies and releases endorphins that are calming. 9. Stay positive. Take the effort to replace negative thoughts with positive ones. Try to see a stressful situation in a positive light. Try to come up with solutions rather than dwelling on the problem. 10. Figure out what triggers your anxiety. Keep a journal and make note of anxious moments and the events surrounding them. This will help you identify triggers you can avoid or even eliminate. 11. Talk to someone. Let a trusted friend, family member or even trained professional know that you are feeling overwhelmed and anxious. Verbalize what you are feeling and why.  12. Volunteer. If your anxiety is triggered by a crisis on a large scale, become an advocate and work to resolve the problem that is causing you unease. Anxiety is often unwelcome and can become overwhelming. If not kept in check, it can become a disorder that could require medical treatment. However, if you take the time to care for yourself  and avoid the triggers that make you anxious, you will be able to find moments of relaxation and clarity that make your life much more enjoyable.

## 2015-10-22 NOTE — Telephone Encounter (Signed)
done

## 2015-11-06 ENCOUNTER — Other Ambulatory Visit: Payer: Self-pay | Admitting: Family Medicine

## 2015-11-07 MED ORDER — RANITIDINE HCL 150 MG PO CAPS: 150.0000 mg | ORAL_CAPSULE | Freq: Every evening | ORAL | 5 refills | Status: DC

## 2015-11-07 NOTE — Telephone Encounter (Signed)
Please let pt know that I'm changing her omeprazole instructions There are risks associated with long-term use of this medicine I'd like her to just take the omeprazole in the morning, and then take a new heartburn medicine in the evening (ranitidine) instead of her evening omeprazole Avoid trigger foods and drinks Call with any issues

## 2015-11-09 NOTE — Telephone Encounter (Signed)
Left detailed voicemail

## 2015-11-18 ENCOUNTER — Telehealth: Payer: Self-pay | Admitting: Family Medicine

## 2015-11-18 ENCOUNTER — Emergency Department
Admission: EM | Admit: 2015-11-18 | Discharge: 2015-11-18 | Disposition: A | Discharge status: Home / Self Care | Payer: Medicare Other | Attending: Emergency Medicine | Admitting: Emergency Medicine

## 2015-11-18 ENCOUNTER — Emergency Department: Payer: Medicare Other

## 2015-11-18 DIAGNOSIS — Y92007 Garden or yard of unspecified non-institutional (private) residence as the place of occurrence of the external cause: Secondary | ICD-10-CM | POA: Insufficient documentation

## 2015-11-18 DIAGNOSIS — Y9301 Activity, walking, marching and hiking: Secondary | ICD-10-CM | POA: Insufficient documentation

## 2015-11-18 DIAGNOSIS — S0990XA Unspecified injury of head, initial encounter: Secondary | ICD-10-CM

## 2015-11-18 DIAGNOSIS — Y999 Unspecified external cause status: Secondary | ICD-10-CM | POA: Diagnosis not present

## 2015-11-18 DIAGNOSIS — W19XXXA Unspecified fall, initial encounter: Secondary | ICD-10-CM

## 2015-11-18 DIAGNOSIS — S0083XA Contusion of other part of head, initial encounter: Secondary | ICD-10-CM | POA: Insufficient documentation

## 2015-11-18 DIAGNOSIS — Z79899 Other long term (current) drug therapy: Secondary | ICD-10-CM | POA: Diagnosis not present

## 2015-11-18 DIAGNOSIS — I2510 Atherosclerotic heart disease of native coronary artery without angina pectoris: Secondary | ICD-10-CM | POA: Insufficient documentation

## 2015-11-18 DIAGNOSIS — W01198A Fall on same level from slipping, tripping and stumbling with subsequent striking against other object, initial encounter: Secondary | ICD-10-CM | POA: Insufficient documentation

## 2015-11-18 DIAGNOSIS — I10 Essential (primary) hypertension: Secondary | ICD-10-CM | POA: Insufficient documentation

## 2015-11-18 DIAGNOSIS — Y92009 Unspecified place in unspecified non-institutional (private) residence as the place of occurrence of the external cause: Secondary | ICD-10-CM

## 2015-11-18 DIAGNOSIS — S199XXA Unspecified injury of neck, initial encounter: Secondary | ICD-10-CM | POA: Diagnosis not present

## 2015-11-18 MED ORDER — ACETAMINOPHEN 500 MG PO TABS
1000.0000 mg | ORAL_TABLET | Freq: Once | ORAL | Status: AC
  Administered 2015-11-18: 1000 mg via ORAL
  Filled 2015-11-18: qty 2

## 2015-11-18 NOTE — ED Notes (Signed)
Pt takes eliquis. Denies any other blood thinners.

## 2015-11-18 NOTE — ED Notes (Signed)
Pt ambulated with steady gait with assistance, denied any pain walking.

## 2015-11-18 NOTE — ED Notes (Signed)
Pt taken to CT via stretcher.

## 2015-11-18 NOTE — ED Triage Notes (Addendum)
Pt presents from home where she lives alone, went out at 2p to trim shrubs, states around 3p fell in driveway. States she hit her head, goose egg noted to L forehead with abraison. States she knows she hit her head, denies movement/light bothering her but states blurred vision and dizziness. Pt is alert and oriented upon arrival. Hx stroke few years ago, stutters over words which is normal for her. Also has cardiac stents. EMS states pt has been stumbling/tripping recently at home and catching the wall, also stated she started an antidepressant recently.

## 2015-11-18 NOTE — ED Provider Notes (Signed)
[facility] Emergency Department Provider Note  Time seen: 3:39 PM  I have reviewed the triage vital signs and the nursing notes.   HISTORY  Chief Complaint Fall and Head Injury    HPI MALAJA WILUSZ is a 82 y.o. female With a past medical history of hypertension, CVA, atrial fibrillation on Eliquis, who presents to the emergency department after a fall. According to the patient she normally uses a cane or a walker, but was not using one today when she tripped walking down her driveway. States she hit the left side of her forehead. Denies LOC or vomiting. Patient was able to call her sister who called 911 for the patient. Patient's only complaint currently is a moderate headache. Denies any neck or back pain. Has not yet ambulated since the fall.  Past Medical History:  Diagnosis Date  . Hypertension   . Stroke [facility])     Patient Active Problem List   Diagnosis Date Noted  . Depression 09/24/2015  . Arthralgia of both hands 09/22/2015  . Medication monitoring encounter 09/22/2015  . Headache 09/22/2015  . Fatigue 09/22/2015  . HTN (hypertension) 06/07/2014  . CVA (cerebral infarction) 06/07/2014  . A-fib ([place]) 06/07/2014  . CAD (coronary artery disease) 06/07/2014  . Dehydration 06/07/2014  . Hypokalemia 06/07/2014    Past Surgical History:  Procedure Laterality Date  . ABDOMINAL HYSTERECTOMY    . APPENDECTOMY    . CHOLECYSTECTOMY    . FRACTURE SURGERY      Prior to Admission medications   Medication Sig Start Date End Date Taking? Authorizing Provider  albuterol (PROVENTIL HFA;VENTOLIN HFA) 108 (90 BASE) MCG/ACT inhaler Inhale 1-2 puffs into the lungs every 6 (six) hours as needed for wheezing or shortness of breath (pneumonia).    Historical Provider, MD  apixaban (ELIQUIS) 5 MG TABS tablet Take 1 tablet (5 mg total) by mouth 2 (two) times daily. 06/08/14   Bettey Costa, MD  atorvastatin (LIPITOR) 80 MG tablet TAKE 1 TABLET AT BEDTIME. 09/13/14    Arnetha Courser, MD  HYDROcodone-acetaminophen (NORCO/VICODIN) 5-325 MG tablet Take 0.5 tablets by mouth every 8 (eight) hours as needed for moderate pain. 10/20/15   Arnetha Courser, MD  isosorbide mononitrate (IMDUR) 60 MG 24 hr tablet Take 60 mg by mouth daily.    Historical Provider, MD  lisinopril-hydrochlorothiazide (PRINZIDE,ZESTORETIC) 20-25 MG tablet Take 1 tablet by mouth daily. 10/12/15   Arnetha Courser, MD  metoprolol succinate (TOPROL-XL) 100 MG 24 hr tablet Take 1 tablet (100 mg total) by mouth daily. Take with or immediately following a meal. Appointment needed for further refills 08/25/15   Arnetha Courser, MD  omeprazole (PRILOSEC) 20 MG capsule Take 1 capsule (20 mg total) by mouth every morning. (decreasing from twice a day to once a day) 11/07/15   Arnetha Courser, MD  ranitidine (ZANTAC) 150 MG capsule Take 1 capsule (150 mg total) by mouth every evening. To help decrease stomach acid 11/07/15   Arnetha Courser, MD  vortioxetine HBr (TRINTELLIX) 10 MG TABS One by mouth daily (this replaces the 5 mg pill) 10/20/15   Arnetha Courser, MD    Allergies  Allergen Reactions  . Levaquin [Levofloxacin] Other (See Comments)    Pt states she have nose bleeds and pain in arms and shoulders.    History reviewed. No pertinent family history.  Social History Social History  Substance Use Topics  . Smoking status: Never Smoker  . Smokeless tobacco: Not on  file  . Alcohol use No    Review of Systems Constitutional: Negative for fever. Cardiovascular: Negative for chest pain. Respiratory: Negative for shortness of breath. Gastrointestinal: Negative for abdominal pain Musculoskeletal: Negative for back pain negative for neck pain. Neurological: mild to moderate headache. Denies any focal weakness or numbness. 10-point ROS otherwise negative.  ____________________________________________   PHYSICAL EXAM:  VITAL SIGNS: ED Triage Vitals  Enc Vitals Group     BP 11/18/15 1530 (!) 127/58      Pulse Rate 11/18/15 1530 70     Resp 11/18/15 1530 20     Temp 11/18/15 1530 98 F (36.7 C)     Temp Source 11/18/15 1530 Oral     SpO2 11/18/15 1530 94 %     Weight 11/18/15 1531 135 lb (61.2 kg)     Height 11/18/15 1531 5' (1.524 m)     Head Circumference --      Peak Flow --      Pain Score 11/18/15 1532 10     Pain Loc --      Pain Edu? --      Excl. in [place]? --     Constitutional: Alert and oriented. Well appearing and in no distress. Eyes: Normal exam ENT   Head: mild hematoma to left forehead with moderate tenderness palpation.   Mouth/Throat: Mucous membranes are moist. Cardiovascular: Normal rate, regular rhythm.  Respiratory: Normal respiratory effort without tachypnea nor retractions. Breath sounds are clear Gastrointestinal: Soft and nontender. No distention. Musculoskeletal: Nontender with normal range of motion in all extremities. Stable pelvis. Good range of motion in all extremities. Nontender range of motion in all extremities. Neurologic:  Normal speech and language. No gross focal neurologic deficits  Skin:  Skin is warm, dry and intact.  Psychiatric: Mood and affect are normal.  ____________________________________________     RADIOLOGY  CT scans are negative.  ____________________________________________   INITIAL IMPRESSION / ASSESSMENT AND PLAN / ED COURSE  Pertinent labs & imaging results that were available during my care of the patient were reviewed by me and considered in my medical decision making (see chart for details).  The patient presents to the emergency department after a fall. Patient did strike the left side of her forehead. Denies LOC or vomiting. Overall the patient appears very well in the emergency department. Does have a slight hematoma to left forehead, otherwise nontraumatic examination. Patient states she is supposed to be using a cane or walker, but was not doing so today. Appears to be a mechanical fall. We'll check  a head and neck CT scan as a precaution, and continue to closely monitor in the emergency department. If the CTs are negative we will attempt a meal at the patient.  CT scans are largely within normal limits. Patient will be discharged home, patient is to use a cane or walker at all times.  ____________________________________________   FINAL CLINICAL IMPRESSION(S) / ED DIAGNOSES  Fall Closed head injury    Harvest Dark, MD 11/18/15 1654

## 2015-11-18 NOTE — ED Notes (Signed)
Pt returned from CT via stretcher. Pt was given ice pack to place on hematoma on forehead.

## 2015-11-18 NOTE — Telephone Encounter (Signed)
I spoke with patient after seeing that she was in the ER She thinks she wants to stop the trintellix; she has been itching all over; I offered something else Patient would like an appt after 2 pm I explained that I will be on vacation until Tuesday I just wanted to check on her and make sure she's okay I recommended PT since she fell; she politely declined -------------------------------- [facility] staff: I will be glad to stay late on Tuesday or Thursday or Friday next week (Oct 24th, 26th, 27th) for patient to come at the end of the day if that works with her schedule; she will call for an appointment

## 2015-11-20 DIAGNOSIS — Z23 Encounter for immunization: Secondary | ICD-10-CM | POA: Diagnosis not present

## 2015-11-24 ENCOUNTER — Encounter: Payer: Self-pay | Admitting: Family Medicine

## 2015-11-24 ENCOUNTER — Ambulatory Visit (INDEPENDENT_AMBULATORY_CARE_PROVIDER_SITE_OTHER): Payer: Medicare Other | Admitting: Family Medicine

## 2015-11-24 ENCOUNTER — Ambulatory Visit
Admission: RE | Admit: 2015-11-24 | Discharge: 2015-11-24 | Disposition: A | Discharge status: Home / Self Care | Payer: Medicare Other | Source: Ambulatory Visit | Attending: Family Medicine | Admitting: Family Medicine

## 2015-11-24 DIAGNOSIS — J9811 Atelectasis: Secondary | ICD-10-CM | POA: Diagnosis not present

## 2015-11-24 DIAGNOSIS — F331 Major depressive disorder, recurrent, moderate: Secondary | ICD-10-CM

## 2015-11-24 DIAGNOSIS — R0781 Pleurodynia: Secondary | ICD-10-CM | POA: Insufficient documentation

## 2015-11-24 DIAGNOSIS — K219 Gastro-esophageal reflux disease without esophagitis: Secondary | ICD-10-CM | POA: Insufficient documentation

## 2015-11-24 DIAGNOSIS — M25562 Pain in left knee: Secondary | ICD-10-CM | POA: Insufficient documentation

## 2015-11-24 DIAGNOSIS — I2510 Atherosclerotic heart disease of native coronary artery without angina pectoris: Secondary | ICD-10-CM | POA: Diagnosis not present

## 2015-11-24 DIAGNOSIS — S8992XA Unspecified injury of left lower leg, initial encounter: Secondary | ICD-10-CM | POA: Diagnosis not present

## 2015-11-24 DIAGNOSIS — S0993XS Unspecified injury of face, sequela: Secondary | ICD-10-CM | POA: Insufficient documentation

## 2015-11-24 DIAGNOSIS — J90 Pleural effusion, not elsewhere classified: Secondary | ICD-10-CM | POA: Insufficient documentation

## 2015-11-24 DIAGNOSIS — I25111 Atherosclerotic heart disease of native coronary artery with angina pectoris with documented spasm: Secondary | ICD-10-CM | POA: Diagnosis not present

## 2015-11-24 MED ORDER — OMEPRAZOLE 20 MG PO CPDR: DELAYED_RELEASE_CAPSULE | ORAL | 0 refills | Status: DC

## 2015-11-24 MED ORDER — HYDROCODONE-ACETAMINOPHEN 5-325 MG PO TABS: 0.5000 | ORAL_TABLET | Freq: Four times a day (QID) | ORAL | 0 refills | Status: DC | PRN

## 2015-11-24 MED ORDER — FLUOXETINE HCL 20 MG PO TABS: ORAL_TABLET | ORAL | 0 refills | Status: DC

## 2015-11-24 NOTE — Patient Instructions (Signed)
Let's get xrays of your ribs and knee today Wean off the omeprazole over the next 4 weeks Caution: prolonged use of proton pump inhibitors like omeprazole (Prilosec), pantoprazole (Protonix), esomeprazole (Nexium), and others like Dexilant and Aciphex may increase your risk of pneumonia, Clostridium difficile colitis, osteoporosis, anemia and other health complications Try to limit or avoid triggers like coffee, caffeinated beverages, onions, chocolate, spicy foods, peppermint, acid foods like pizza, spaghetti sauce, and orange juice Lose weight if you are overweight or obese Try elevating the head of your bed by placing a small wedge between your mattress and box springs to keep acid in the stomach at night instead of coming up into your esophagus Start the new fluoxetine Return for visit in 3 weeks, but seek medical attention right away if any dark thoughts Do some moderate breathing 10x an hour

## 2015-11-24 NOTE — Progress Notes (Signed)
 BP 130/70   Pulse 61   Temp 98.1 F (36.7 C) (Oral)   Resp 16   Wt 148 lb (67.1 kg)   SpO2 97%   BMI 28.90 kg/m    Subjective:    Patient ID: Samantha [name], female    DOB: 07/02/1928, 82 y.o.   MRN: [id number]  HPI: Samantha [name] is a 82 y.o. female  Chief Complaint  Patient presents with  . ER follow-up    fell last wendesday, ER did head ct which was normal.  Left side of chest hurting and having left knee pain.   Patient is here with her sister; she suffered a fall in her driveway and was seen in the ER on 11/18/15; note reviewed Her greatest complaint today is pain along the left side of her ribcage; it hurts to cough and move; deep to the left breast, not the breast itself; happened with the fall; not her heart, she says this has been going on since the fall; no bruise; she describes the level of pain as "serious"  She hit her left side of the heado LOC, no vomiting; they did a head CT and neck CT in the ER; she still has significant pain over the left lateral brow; she denies double vision; she has been having blurred vision, but not new since the accident; no double vision; saw Dr. Porfilio recently; was told she has 20/20 vision  She is still having a headache; just comes on; just feels bad; she had moderate headache noted in the ER note No neck pain, no back pain No shoulder and elbow are fine  Left knee pain, medial; feels like it gives way and she has to catch herself Keeps cane with her all the time now She is taking hydrocodone 1/2 pill with some relief of pain, doesn't last very long; does not make her goofy or loopy  She has no idea why she fell; in the driveway at her house  She is on Trintellix for depression; with all of this happening, not helping her mood; she absolutely denies that she would hurt herself; patient's sister present through visit This medicine is causing her to be unusually dizzy; she stopped taking it 2-3 days ago It was making her itch  all over; she has family problems that are causing her to feel bad; situational depression; patient is safe, no one threatening her or taking advantage of her financially She used to take prozac for years; wants to go back to original med that she used for so long  Depression screen PHQ 2/9 11/24/2015 10/20/2015 09/22/2015  Decreased Interest 0 1 3  Down, Depressed, Hopeless 3 1 3  PHQ - 2 Score 3 2 6  Altered sleeping 3 1 3  Tired, decreased energy 3 2 3  Change in appetite 0 0 1  Feeling bad or failure about yourself  3 1 3  Trouble concentrating 3 0 0  Moving slowly or fidgety/restless 3 1 1  Suicidal thoughts 3 0 3  PHQ-9 Score 21 7 20  Difficult doing work/chores Extremely dIfficult Very difficult Extremely dIfficult  MD note: passive; no active thoughts of hurting self  Relevant past medical, surgical, family and social history reviewed Past Medical History:  Diagnosis Date  . Hypertension   . Stroke (HCC)    Past Surgical History:  Procedure Laterality Date  . ABDOMINAL HYSTERECTOMY    . APPENDECTOMY    . CHOLECYSTECTOMY    . FRACTURE SURGERY       Family History  Problem Relation Age of Onset  . Stroke Mother   . Heart attack Father   . Heart disease Sister   . Diabetes Sister    Social History  Substance Use Topics  . Smoking status: Never Smoker  . Smokeless tobacco: Never Used  . Alcohol use No   Interim medical history since last visit reviewed. Allergies and medications reviewed  Review of Systems Per HPI unless specifically indicated above     Objective:    BP 130/70   Pulse 61   Temp 98.1 F (36.7 C) (Oral)   Resp 16   Wt 148 lb (67.1 kg)   SpO2 97%   BMI 28.90 kg/m   Wt Readings from Last 3 Encounters:  11/24/15 148 lb (67.1 kg)  11/18/15 135 lb (61.2 kg)  10/20/15 147 lb 3.2 oz (66.8 kg)  MD note: 135 pound measurement is doubtful  Physical Exam  Constitutional: She appears well-developed and well-nourished. No distress (but does  appear to be in pain when taking deeper breaths, walking (holds along left side of ribcage)).  HENT:  Head: Head is with contusion and with laceration (scabbed over, above left brow, about 3 x 5 mm). Head is without abrasion, without right periorbital erythema and without left periorbital erythema.  Nose: No rhinorrhea.  Mouth/Throat: Mucous membranes are normal.  Eyes: EOM are normal. No scleral icterus.  Neck: No thyromegaly present.  Cardiovascular: Normal rate.   Pulmonary/Chest: Effort normal and breath sounds normal. She exhibits tenderness, bony tenderness and swelling.    Abdominal: She exhibits no distension.  Musculoskeletal:       Left knee: She exhibits no swelling and no effusion. Tenderness found. No medial joint line, no lateral joint line, no MCL, no LCL and no patellar tendon tenderness noted.  Tender to palpation over the tibial plateau  Neurological: She is alert. No cranial nerve deficit. She exhibits normal muscle tone.  Skin: No pallor.  Psychiatric: She has a normal mood and affect. Her behavior is normal. Judgment and thought content normal. Her mood appears not anxious. She does not exhibit a depressed mood.    Results for orders placed or performed in visit on 09/22/15  CBC with Differential/Platelet  Result Value Ref Range   WBC 7.2 3.8 - 10.8 K/uL   RBC 4.58 3.80 - 5.10 MIL/uL   Hemoglobin 14.2 11.7 - 15.5 g/dL   HCT 42.7 35.0 - 45.0 %   MCV 93.2 80.0 - 100.0 fL   MCH 31.0 27.0 - 33.0 pg   MCHC 33.3 32.0 - 36.0 g/dL   RDW 13.3 11.0 - 15.0 %   Platelets 277 140 - 400 K/uL   MPV 10.8 7.5 - 12.5 fL   Neutro Abs 3,960 1,500 - 7,800 cells/uL   Lymphs Abs 2,376 850 - 3,900 cells/uL   Monocytes Absolute 576 200 - 950 cells/uL   Eosinophils Absolute 288 15 - 500 cells/uL   Basophils Absolute 0 0 - 200 cells/uL   Neutrophils Relative % 55 %   Lymphocytes Relative 33 %   Monocytes Relative 8 %   Eosinophils Relative 4 %   Basophils Relative 0 %   Smear  Review Criteria for review not met   Sed Rate (ESR)  Result Value Ref Range   Sed Rate 22 0 - 30 mm/hr  C-reactive protein  Result Value Ref Range   CRP <0.5 <0.60 mg/dL  ANA,IFA RA Diag Pnl w/rflx Tit/Patn  Result Value Ref Range  Rhuematoid fact SerPl-aCnc <10 <=14 IU/mL   Anit Nuclear Antibody(ANA) NEG NEGATIVE   Cyclic Citrullin Peptide Ab <16 Units  Comprehensive metabolic panel  Result Value Ref Range   Sodium 140 135 - 146 mmol/L   Potassium 4.6 3.5 - 5.3 mmol/L   Chloride 100 98 - 110 mmol/L   CO2 31 20 - 31 mmol/L   Glucose, Bld 110 (H) 65 - 99 mg/dL   BUN 17 7 - 25 mg/dL   Creat 0.78 0.60 - 0.88 mg/dL   Total Bilirubin 0.5 0.2 - 1.2 mg/dL   Alkaline Phosphatase 106 33 - 130 U/L   AST 29 10 - 35 U/L   ALT 26 6 - 29 U/L   Total Protein 6.7 6.1 - 8.1 g/dL   Albumin 3.9 3.6 - 5.1 g/dL   Calcium 9.6 8.6 - 10.4 mg/dL  TSH  Result Value Ref Range   TSH 3.11 mIU/L  Uric acid  Result Value Ref Range   Uric Acid, Serum 7.1 (H) 2.5 - 7.0 mg/dL      Assessment & Plan:   Problem List Items Addressed This Visit      Digestive   GERD (gastroesophageal reflux disease)    Cautioned patient about the prolonged use of proton pump inhibitors like omeprazole (Prilosec), pantoprazole (Protonix), which may increase risk of pneumonia, Clostridium difficile colitis, osteoporosis, anemia and other health complications;  limit or avoid triggers like coffee, caffeinated beverages, onions, chocolate, spicy foods, peppermint, acid foods like pizza, spaghetti sauce, and orange juice; new recommendations limit PPIs to 8 weeks in patients 65 and over; wean off of PPI and use H2 blocker instead       Relevant Medications   omeprazole (PRILOSEC) 20 MG capsule     Other   Rib pain on left side    Concerning for fracture; will get xray; may take weeks to heal; discussed risk of atelectasis, pneumonia; hydrocodone for pain      Relevant Orders   DG Ribs Unilateral Left   Knee pain, left      Xray knee; discussed increased risk of falls with knee pain, especially if she feels unstable, and I recommended physical therapy; she politely declined; I will leave an open invitation for PT if patient changes her mind      Relevant Orders   DG Knee Complete 4 Views Left   Forehead trauma, sequela    Xray the orbit      Depression    With passive but no active SI; off of Trintellix now; start back on fluoxetine, 20 mg daily for 2 weeks, then 40 mg daily; call if needed; discussed the need to get immediate medical attention if dark thoughts; patient and sister agree      Relevant Medications   FLUoxetine (PROZAC) 20 MG tablet    Other Visit Diagnoses   None.      Follow up plan: Return in about 3 weeks (around 12/15/2015).  An after-visit summary was printed and given to the patient at [place].  Please see the patient instructions which may contain other information and recommendations beyond what is mentioned above in the assessment and plan.  Meds ordered this encounter  Medications  . FLUoxetine (PROZAC) 20 MG tablet    Sig: One by mouth daily for two weeks, then two pills daily    Dispense:  46 tablet    Refill:  0  . omeprazole (PRILOSEC) 20 MG capsule    Sig: One by mouth every other  day for 2 weeks, then one pill every three days for 2 weeks, then stop    Dispense:  15 capsule    Refill:  0  . HYDROcodone-acetaminophen (NORCO/VICODIN) 5-325 MG tablet    Sig: Take 0.5 tablets by mouth every 6 (six) hours as needed for moderate pain.    Dispense:  30 tablet    Refill:  0    Orders Placed This Encounter  Procedures  . DG Ribs Unilateral Left  . DG Knee Complete 4 Views Left       

## 2015-11-24 NOTE — Assessment & Plan Note (Signed)
With passive but no active SI; off of Trintellix now; start back on fluoxetine, 20 mg daily for 2 weeks, then 40 mg daily; call if needed; discussed the need to get immediate medical attention if dark thoughts; patient and sister agree

## 2015-11-24 NOTE — Assessment & Plan Note (Signed)
Cautioned patient about the prolonged use of proton pump inhibitors like omeprazole (Prilosec), pantoprazole (Protonix), which may increase risk of pneumonia, Clostridium difficile colitis, osteoporosis, anemia and other health complications;  limit or avoid triggers like coffee, caffeinated beverages, onions, chocolate, spicy foods, peppermint, acid foods like pizza, spaghetti sauce, and orange juice; new recommendations limit PPIs to 8 weeks in patients 65 and over; wean off of PPI and use H2 blocker instead

## 2015-11-24 NOTE — Assessment & Plan Note (Addendum)
Xray knee; discussed increased risk of falls with knee pain, especially if she feels unstable, and I recommended physical therapy; she politely declined; I will leave an open invitation for PT if patient changes her mind

## 2015-11-24 NOTE — Assessment & Plan Note (Signed)
Xray the orbit

## 2015-11-24 NOTE — Assessment & Plan Note (Addendum)
Concerning for fracture; will get xray; may take weeks to heal; discussed risk of atelectasis, pneumonia; hydrocodone for pain

## 2015-12-15 ENCOUNTER — Ambulatory Visit: Payer: Medicare Other | Admitting: Family Medicine

## 2016-01-19 ENCOUNTER — Other Ambulatory Visit: Payer: Self-pay | Admitting: Family Medicine

## 2016-02-02 ENCOUNTER — Ambulatory Visit: Payer: Medicare Other | Admitting: Family Medicine

## 2016-07-21 DIAGNOSIS — E7800 Pure hypercholesterolemia, unspecified: Secondary | ICD-10-CM | POA: Diagnosis not present

## 2016-07-21 DIAGNOSIS — I482 Chronic atrial fibrillation: Secondary | ICD-10-CM | POA: Diagnosis not present

## 2016-07-21 DIAGNOSIS — R079 Chest pain, unspecified: Secondary | ICD-10-CM | POA: Diagnosis not present

## 2016-07-21 DIAGNOSIS — I10 Essential (primary) hypertension: Secondary | ICD-10-CM | POA: Diagnosis not present

## 2016-07-21 DIAGNOSIS — R0602 Shortness of breath: Secondary | ICD-10-CM | POA: Diagnosis not present

## 2016-07-21 DIAGNOSIS — I25118 Atherosclerotic heart disease of native coronary artery with other forms of angina pectoris: Secondary | ICD-10-CM | POA: Diagnosis not present

## 2016-07-21 DIAGNOSIS — I2510 Atherosclerotic heart disease of native coronary artery without angina pectoris: Secondary | ICD-10-CM | POA: Diagnosis not present

## 2016-07-27 ENCOUNTER — Telehealth: Payer: Self-pay | Admitting: Family Medicine

## 2016-07-28 NOTE — Telephone Encounter (Signed)
Left voice message for pt to return call. Script has been sent to pharmacy and she need to schedule appointment within 30 days

## 2016-07-29 NOTE — Telephone Encounter (Signed)
lvm to schedule appt.  

## 2016-08-02 NOTE — Telephone Encounter (Signed)
Pt has scheduled appointment for August 16, 2016

## 2016-08-16 ENCOUNTER — Ambulatory Visit (INDEPENDENT_AMBULATORY_CARE_PROVIDER_SITE_OTHER): Payer: Medicare Other | Admitting: Family Medicine

## 2016-08-16 ENCOUNTER — Ambulatory Visit
Admission: RE | Admit: 2016-08-16 | Discharge: 2016-08-16 | Disposition: A | Discharge status: Home / Self Care | Payer: Medicare Other | Source: Ambulatory Visit | Attending: Family Medicine | Admitting: Family Medicine

## 2016-08-16 ENCOUNTER — Encounter: Payer: Self-pay | Admitting: Family Medicine

## 2016-08-16 VITALS — BP 118/52 | HR 87 | Temp 98.2°F | Resp 14 | Wt 144.7 lb

## 2016-08-16 DIAGNOSIS — E785 Hyperlipidemia, unspecified: Secondary | ICD-10-CM | POA: Diagnosis not present

## 2016-08-16 DIAGNOSIS — Z5181 Encounter for therapeutic drug level monitoring: Secondary | ICD-10-CM

## 2016-08-16 DIAGNOSIS — K219 Gastro-esophageal reflux disease without esophagitis: Secondary | ICD-10-CM | POA: Diagnosis not present

## 2016-08-16 DIAGNOSIS — R490 Dysphonia: Secondary | ICD-10-CM | POA: Diagnosis not present

## 2016-08-16 DIAGNOSIS — R51 Headache: Secondary | ICD-10-CM

## 2016-08-16 DIAGNOSIS — R0609 Other forms of dyspnea: Secondary | ICD-10-CM | POA: Insufficient documentation

## 2016-08-16 DIAGNOSIS — I482 Chronic atrial fibrillation, unspecified: Secondary | ICD-10-CM

## 2016-08-16 DIAGNOSIS — G8929 Other chronic pain: Secondary | ICD-10-CM

## 2016-08-16 DIAGNOSIS — R0602 Shortness of breath: Secondary | ICD-10-CM | POA: Diagnosis not present

## 2016-08-16 DIAGNOSIS — I25111 Atherosclerotic heart disease of native coronary artery with angina pectoris with documented spasm: Secondary | ICD-10-CM

## 2016-08-16 DIAGNOSIS — I700 Atherosclerosis of aorta: Secondary | ICD-10-CM | POA: Diagnosis not present

## 2016-08-16 DIAGNOSIS — E559 Vitamin D deficiency, unspecified: Secondary | ICD-10-CM | POA: Diagnosis not present

## 2016-08-16 DIAGNOSIS — J9811 Atelectasis: Secondary | ICD-10-CM | POA: Diagnosis not present

## 2016-08-16 DIAGNOSIS — R531 Weakness: Secondary | ICD-10-CM

## 2016-08-16 DIAGNOSIS — I10 Essential (primary) hypertension: Secondary | ICD-10-CM

## 2016-08-16 DIAGNOSIS — R05 Cough: Secondary | ICD-10-CM | POA: Diagnosis not present

## 2016-08-16 DIAGNOSIS — R930 Abnormal findings on diagnostic imaging of skull and head, not elsewhere classified: Secondary | ICD-10-CM

## 2016-08-16 DIAGNOSIS — I209 Angina pectoris, unspecified: Secondary | ICD-10-CM

## 2016-08-16 HISTORY — DX: Dysphonia: R49.0

## 2016-08-16 HISTORY — DX: Hyperlipidemia, unspecified: E78.5

## 2016-08-16 LAB — CBC WITH DIFFERENTIAL/PLATELET
BASOS PCT: 0 %
Basophils Absolute: 0 cells/uL (ref 0–200)
EOS ABS: 210 {cells}/uL (ref 15–500)
Eosinophils Relative: 3 %
HCT: 42 % (ref 35.0–45.0)
Hemoglobin: 13.9 g/dL (ref 11.7–15.5)
LYMPHS PCT: 30 %
Lymphs Abs: 2100 cells/uL (ref 850–3900)
MCH: 31.5 pg (ref 27.0–33.0)
MCHC: 33.1 g/dL (ref 32.0–36.0)
MCV: 95.2 fL (ref 80.0–100.0)
MONOS PCT: 11 %
MPV: 9.9 fL (ref 7.5–12.5)
Monocytes Absolute: 770 cells/uL (ref 200–950)
Neutro Abs: 3920 cells/uL (ref 1500–7800)
Neutrophils Relative %: 56 %
PLATELETS: 298 10*3/uL (ref 140–400)
RBC: 4.41 MIL/uL (ref 3.80–5.10)
RDW: 13.5 % (ref 11.0–15.0)
WBC: 7 10*3/uL (ref 3.8–10.8)

## 2016-08-16 LAB — COMPLETE METABOLIC PANEL WITH GFR
ALT: 20 U/L (ref 6–29)
AST: 21 U/L (ref 10–35)
Albumin: 3.8 g/dL (ref 3.6–5.1)
Alkaline Phosphatase: 97 U/L (ref 33–130)
BUN: 19 mg/dL (ref 7–25)
CO2: 29 mmol/L (ref 20–31)
CREATININE: 1.02 mg/dL — AB (ref 0.60–0.88)
Calcium: 9.6 mg/dL (ref 8.6–10.4)
Chloride: 102 mmol/L (ref 98–110)
GFR, EST AFRICAN AMERICAN: 58 mL/min — AB (ref >=60)
GFR, EST NON AFRICAN AMERICAN: 50 mL/min — AB (ref >=60)
Glucose, Bld: 107 mg/dL — ABNORMAL HIGH (ref 65–99)
Potassium: 4.2 mmol/L (ref 3.5–5.3)
Sodium: 142 mmol/L (ref 135–146)
Total Bilirubin: 0.4 mg/dL (ref 0.2–1.2)
Total Protein: 6.4 g/dL (ref 6.1–8.1)

## 2016-08-16 LAB — TSH: TSH: 2.56 mIU/L

## 2016-08-16 LAB — LIPID PANEL
Cholesterol: 198 mg/dL (ref <200)
HDL: 39 mg/dL — AB (ref >50)
LDL CALC: 98 mg/dL (ref <100)
TRIGLYCERIDES: 307 mg/dL — AB (ref <150)
Total CHOL/HDL Ratio: 5.1 Ratio — ABNORMAL HIGH (ref <5.0)
VLDL: 61 mg/dL — AB (ref <30)

## 2016-08-16 NOTE — Assessment & Plan Note (Signed)
Trouble with voice since her stroke; she is not interested in seeing another specialist or having more therapy; open invitation in the chart should she change her mind

## 2016-08-16 NOTE — Progress Notes (Signed)
BP (!) 118/52   Pulse 87   Temp 98.2 F (36.8 C) (Oral)   Resp 14   Wt 144 lb 11.2 oz (65.6 kg)   SpO2 96%   BMI 28.26 kg/m    Subjective:    Patient ID: Samantha [name], female    DOB: March 04, 1928, 83 y.o.   MRN: [id number]  HPI: Samantha [name] is a 83 y.o. female  Chief Complaint  Patient presents with  . Follow-up    HPI Patient is here for f/u She says he is "getting hold and that's just the way it is" Her voice is still boethering her, if she gets nervous and then can't hardly talk at all I asked if she saw a voice specialist, and she did at [facility]; they did all kinds of tests; they couldn't find any reason She lives by herself; doesn't talk to anybody for days on end, then when she gets with someone to talk, then it's hard Doesn't hurt to talk at all She has done speech therapy for a while; she doesn't know what else they could do They tried gabapentin and muscle relaxants; helped a little she thinks One was a spray that she used, thought it was gabapentin, over two years ago or so Voice trouble started after her stroke Dysphonia note for ENT, [facility] July 16, 2009  She has been treated for depression; on 40 mg of prozac daily  She has hx of stroke, April 2011; on eliquis; CHADSVASC score of 6  She has atrial fibrillation and coronary artery disease; she just saw Dr. Saralyn Pilar, her cardiologist; visit on July 21, 2016 He wanted her to follow the DASH guidelines; she is already doing a lot of that Last stent was 2008, over 10 years ago; does get chest pain and he knows about it and he is doing a test on July 26th On eliquis for stroke prevention; no bleeding or bruising  Her BP at cardiologist was 118/60 and he wanted to continue current medicines  She asked about testing for diabetes; lots of family hx of diabetes; no dry mouth; does have blurred vision  She gets severe pain in her head; has a knot that has come up on her head; when she has this pain, it goes  straight to that one area and just about causes her to lose it; saw specialist at [facility] They did a head xray and it shows a black area in her head in that same area We talked about this before; I put in a referral in August and again in September of 2017; she still hasn't been to see the specialist; pain doesn't happen all the time, sends her into orbit when it hits her  GERD; omeprazole chronically before, then we weaned her off, substituted H2 blocker and she isn't taking that now; says she really doesn't have heartburn  She gives out of breath quickly; out of breath when taking a bath; has to lay down and cannot function; putting away dishes; sleeping an awful lot; gets so tired; she told Dr. Saralyn Pilar about this and he has ordered an echo  Depression screen [facility] 2/9 08/16/2016 11/24/2015 10/20/2015 09/22/2015  Decreased Interest 0 0 1 3  Down, Depressed, Hopeless _0 PHQ - 2 Score _1 Altered sleeping - _2 Tired, decreased energy - _3 Change in appetite - 0 0 1  Feeling bad or failure about yourself  - 3  1 3  Trouble concentrating - 3 0 0  Moving slowly or fidgety/restless - _0 Suicidal thoughts - 3 0 3  PHQ-9 Score - _1 Difficult doing work/chores - Extremely dIfficult Very difficult Extremely dIfficult    Relevant past medical, surgical, family and social history reviewed Past Medical History:  Diagnosis Date  . A-fib ([place]) 06/07/2014  . CAD (coronary artery disease) 06/07/2014  . Depression 09/24/2015  . Dyslipidemia 08/16/2016  . Dysphonia 08/16/2016  . Hypertension   . Stroke [facility])    Past Surgical History:  Procedure Laterality Date  . ABDOMINAL HYSTERECTOMY    . APPENDECTOMY    . CHOLECYSTECTOMY    . FRACTURE SURGERY     Family History  Problem Relation Age of Onset  . Stroke Mother   . Heart attack Father   . Heart disease Sister   . Diabetes Sister    Social History   Social History  . Marital status: Married    Spouse name: N/A  .  Number of children: N/A  . Years of education: N/A   Occupational History  . Not on file.   Social History Main Topics  . Smoking status: Never Smoker  . Smokeless tobacco: Never Used  . Alcohol use No  . Drug use: Unknown  . Sexual activity: Not on file   Other Topics Concern  . Not on file   Social History Narrative  . No narrative on file    Interim medical history since last visit reviewed. Allergies and medications reviewed  Review of Systems Per HPI unless specifically indicated above     Objective:    BP (!) 118/52   Pulse 87   Temp 98.2 F (36.8 C) (Oral)   Resp 14   Wt 144 lb 11.2 oz (65.6 kg)   SpO2 96%   BMI 28.26 kg/m   Wt Readings from Last 3 Encounters:  08/16/16 144 lb 11.2 oz (65.6 kg)  11/24/15 148 lb (67.1 kg)  11/18/15 135 lb (61.2 kg)    Physical Exam  Constitutional: She appears well-developed and well-nourished. No distress.  Elderly, appears deconditioned, rather frail though wearing bright colors, make-up, and hair done up, still elderly and frail appearing  HENT:  Head: Normocephalic and atraumatic.  Voice is hesitating and weak and quivering;   Eyes: EOM are normal. No scleral icterus.  Neck: No thyromegaly present.  Cardiovascular: Normal rate, regular rhythm and normal heart sounds.   No murmur heard. Pulmonary/Chest: Effort normal and breath sounds normal. No respiratory distress. She has no wheezes.  Abdominal: Soft. Bowel sounds are normal. She exhibits no distension.  Musculoskeletal: Normal range of motion. She exhibits no edema.  Neurological: She is alert. She exhibits normal muscle tone.  Skin: Skin is warm and dry. She is not diaphoretic. No pallor.  Psychiatric: She has a normal mood and affect. Her behavior is normal. Judgment and thought content normal.    Results for orders placed or performed in visit on 09/22/15  CBC with Differential/Platelet  Result Value Ref Range   WBC 7.2 3.8 - 10.8 K/uL   RBC 4.58 3.80 -  5.10 MIL/uL   Hemoglobin 14.2 11.7 - 15.5 g/dL   HCT 42.7 35.0 - 45.0 %   MCV 93.2 80.0 - 100.0 fL   MCH 31.0 27.0 - 33.0 pg   MCHC 33.3 32.0 - 36.0 g/dL   RDW 13.3 11.0 - 15.0 %   Platelets 277 140 - 400 K/uL  MPV 10.8 7.5 - 12.5 fL   Neutro Abs 3,960 1,500 - 7,800 cells/uL   Lymphs Abs 2,376 850 - 3,900 cells/uL   Monocytes Absolute 576 200 - 950 cells/uL   Eosinophils Absolute 288 15 - 500 cells/uL   Basophils Absolute 0 0 - 200 cells/uL   Neutrophils Relative % 55 %   Lymphocytes Relative 33 %   Monocytes Relative 8 %   Eosinophils Relative 4 %   Basophils Relative 0 %   Smear Review Criteria for review not met   Sed Rate (ESR)  Result Value Ref Range   Sed Rate 22 0 - 30 mm/hr  C-reactive protein  Result Value Ref Range   CRP <0.5 <0.60 mg/dL  ANA,IFA RA Diag Pnl w/rflx Tit/Patn  Result Value Ref Range   Rhuematoid fact SerPl-aCnc <10 <=14 IU/mL   Anit Nuclear Antibody(ANA) NEG NEGATIVE   Cyclic Citrullin Peptide Ab <16 Units  Comprehensive metabolic panel  Result Value Ref Range   Sodium 140 135 - 146 mmol/L   Potassium 4.6 3.5 - 5.3 mmol/L   Chloride 100 98 - 110 mmol/L   CO2 31 20 - 31 mmol/L   Glucose, Bld 110 (H) 65 - 99 mg/dL   BUN 17 7 - 25 mg/dL   Creat 0.78 0.60 - 0.88 mg/dL   Total Bilirubin 0.5 0.2 - 1.2 mg/dL   Alkaline Phosphatase 106 33 - 130 U/L   AST 29 10 - 35 U/L   ALT 26 6 - 29 U/L   Total Protein 6.7 6.1 - 8.1 g/dL   Albumin 3.9 3.6 - 5.1 g/dL   Calcium 9.6 8.6 - 10.4 mg/dL  TSH  Result Value Ref Range   TSH 3.11 mIU/L  Uric acid  Result Value Ref Range   Uric Acid, Serum 7.1 (H) 2.5 - 7.0 mg/dL      Assessment & Plan:   Problem List Items Addressed This Visit      Cardiovascular and Mediastinum   HTN (hypertension) - Primary    Well-controlled here and at cardiologist's; following DASH guidelines      CAD (coronary artery disease)    Reviewed cardiology note; she has testing coming up with him; reviewed calling 911       A-fib [facility])    Managed by cardiologist, rate controlled; on eliquis      Relevant Orders   TSH     Digestive   GERD (gastroesophageal reflux disease)    Resolved and off of H2 blocker and PPI now        Other   Medication monitoring encounter    Check labs      Relevant Orders   CBC with Differential/Platelet   COMPLETE METABOLIC PANEL WITH GFR   Headache    Will put in a 3rd referral      Relevant Orders   Ambulatory referral to Neurology   Dysphonia    Trouble with voice since her stroke; she is not interested in seeing another specialist or having more therapy; open invitation in the chart should she change her mind      Dyslipidemia    Try to limit saturated fats; continue statin; check lipids      Relevant Orders   Lipid panel    Other Visit Diagnoses    Abnormal findings on diagnostic imaging of skull and head, not elsewhere classified       referring again to neurologist   Relevant Orders   Ambulatory referral to Neurology  Vitamin D deficiency       Relevant Orders   VITAMIN D 25 Hydroxy (Vit-D Deficiency, Fractures)   Dyspnea on exertion       being evaluated by cardiologist, echo pending, then f/u with him after test; will also check CXR, CBC today   Relevant Orders   DG Chest 2 View   CBC with Differential/Platelet   Weakness       patient may benefit from cardiopulmonary rehab; will send note to her cardiologist to see if he wants to refer or wait until after echo       Follow up plan: Return in about 4 weeks (around 09/13/2016).  An after-visit summary was printed and given to the patient at [place].  Please see the patient instructions which may contain other information and recommendations beyond what is mentioned above in the assessment and plan.  Meds ordered this encounter  Medications  . Cholecalciferol (VITAMIN D-1000 MAX ST) 1000 units tablet    Sig: Take 1,000 Units by mouth daily.    Orders Placed This Encounter  Procedures    . DG Chest 2 View  . CBC with Differential/Platelet  . COMPLETE METABOLIC PANEL WITH GFR  . Lipid panel  . TSH  . VITAMIN D 25 Hydroxy (Vit-D Deficiency, Fractures)  . Ambulatory referral to Neurology   Face-to-face time with patient was more than 40 minutes, >50% time spent counseling and coordination of care

## 2016-08-16 NOTE — Assessment & Plan Note (Signed)
Reviewed cardiology note; she has testing coming up with him; reviewed calling 911

## 2016-08-16 NOTE — Patient Instructions (Signed)
Let's get a chest xray today across the street We'll get labs If you have not heard anything from my staff in a week about any orders/referrals/studies from today, please contact us here to follow-up [phone] We'll have you get the echo with your cardiologist If shortness of breath continues, and no reason is found on the echo, then call me and I'll refer you to a breathing doctor Please do call Dr. Saralyn Pilar with your blood pressure today (118/52) because he may want to adjust one of your blood pressure medicines (he may want to decrease your HCTZ from 25 mg to 12.5 mg), and we'll defer that to him  Try to follow the DASH guidelines (DASH stands for Dietary Approaches to Stop Hypertension) Try to limit the sodium in your diet.  Ideally, consume less than 1.5 grams (less than 1,500mg ) per day. Do not add salt when cooking or at the table.  Check the sodium amount on labels when shopping, and choose items lower in sodium when given a choice. Avoid or limit foods that already contain a lot of sodium. Eat a diet rich in fruits and vegetables and whole grains.

## 2016-08-16 NOTE — Assessment & Plan Note (Signed)
Check labs 

## 2016-08-16 NOTE — Assessment & Plan Note (Signed)
Managed by cardiologist, rate controlled; on eliquis

## 2016-08-16 NOTE — Assessment & Plan Note (Signed)
Resolved and off of H2 blocker and PPI now

## 2016-08-16 NOTE — Assessment & Plan Note (Signed)
Will put in a 3rd referral

## 2016-08-16 NOTE — Assessment & Plan Note (Signed)
Try to limit saturated fats; continue statin; check lipids

## 2016-08-16 NOTE — Assessment & Plan Note (Signed)
Well-controlled here and at cardiologist's; following DASH guidelines

## 2016-08-17 ENCOUNTER — Encounter: Payer: Self-pay | Admitting: Neurology

## 2016-08-17 LAB — VITAMIN D 25 HYDROXY (VIT D DEFICIENCY, FRACTURES): Vit D, 25-Hydroxy: 53 ng/mL (ref 30–100)

## 2016-08-18 ENCOUNTER — Emergency Department: Payer: Medicare Other

## 2016-08-18 ENCOUNTER — Encounter: Payer: Self-pay | Admitting: *Deleted

## 2016-08-18 ENCOUNTER — Observation Stay: Payer: Medicare Other

## 2016-08-18 ENCOUNTER — Observation Stay
Admission: EM | Admit: 2016-08-18 | Discharge: 2016-08-19 | Disposition: A | Discharge status: Home / Self Care | Payer: Medicare Other | Attending: Internal Medicine | Admitting: Internal Medicine

## 2016-08-18 DIAGNOSIS — K219 Gastro-esophageal reflux disease without esophagitis: Secondary | ICD-10-CM | POA: Insufficient documentation

## 2016-08-18 DIAGNOSIS — I482 Chronic atrial fibrillation: Secondary | ICD-10-CM | POA: Diagnosis not present

## 2016-08-18 DIAGNOSIS — Z8673 Personal history of transient ischemic attack (TIA), and cerebral infarction without residual deficits: Secondary | ICD-10-CM | POA: Diagnosis not present

## 2016-08-18 DIAGNOSIS — R4781 Slurred speech: Secondary | ICD-10-CM | POA: Diagnosis not present

## 2016-08-18 DIAGNOSIS — E785 Hyperlipidemia, unspecified: Secondary | ICD-10-CM | POA: Diagnosis not present

## 2016-08-18 DIAGNOSIS — F329 Major depressive disorder, single episode, unspecified: Secondary | ICD-10-CM | POA: Diagnosis not present

## 2016-08-18 DIAGNOSIS — R4182 Altered mental status, unspecified: Principal | ICD-10-CM | POA: Insufficient documentation

## 2016-08-18 DIAGNOSIS — I2510 Atherosclerotic heart disease of native coronary artery without angina pectoris: Secondary | ICD-10-CM | POA: Insufficient documentation

## 2016-08-18 DIAGNOSIS — Z7901 Long term (current) use of anticoagulants: Secondary | ICD-10-CM | POA: Insufficient documentation

## 2016-08-18 DIAGNOSIS — R410 Disorientation, unspecified: Secondary | ICD-10-CM | POA: Diagnosis not present

## 2016-08-18 DIAGNOSIS — I10 Essential (primary) hypertension: Secondary | ICD-10-CM | POA: Insufficient documentation

## 2016-08-18 DIAGNOSIS — R4020 Unspecified coma: Secondary | ICD-10-CM | POA: Diagnosis not present

## 2016-08-18 LAB — CBC WITH DIFFERENTIAL/PLATELET
BASOS ABS: 0 10*3/uL (ref 0–0.1)
BASOS PCT: 1 %
EOS ABS: 0.2 10*3/uL (ref 0–0.7)
Eosinophils Relative: 4 %
HCT: 40.3 % (ref 35.0–47.0)
Hemoglobin: 13.9 g/dL (ref 12.0–16.0)
Lymphocytes Relative: 40 %
Lymphs Abs: 2 10*3/uL (ref 1.0–3.6)
MCH: 31.6 pg (ref 26.0–34.0)
MCHC: 34.5 g/dL (ref 32.0–36.0)
MCV: 91.7 fL (ref 80.0–100.0)
MONO ABS: 0.6 10*3/uL (ref 0.2–0.9)
MONOS PCT: 11 %
NEUTROS ABS: 2.2 10*3/uL (ref 1.4–6.5)
Neutrophils Relative %: 44 %
Platelets: 239 10*3/uL (ref 150–440)
RBC: 4.39 MIL/uL (ref 3.80–5.20)
RDW: 12.8 % (ref 11.5–14.5)
WBC: 5.1 10*3/uL (ref 3.6–11.0)

## 2016-08-18 LAB — URINALYSIS, COMPLETE (UACMP) WITH MICROSCOPIC
BACTERIA UA: NONE SEEN
BILIRUBIN URINE: NEGATIVE
Glucose, UA: NEGATIVE mg/dL
HGB URINE DIPSTICK: NEGATIVE
KETONES UR: NEGATIVE mg/dL
LEUKOCYTES UA: NEGATIVE
NITRITE: NEGATIVE
PH: 5 (ref 5.0–8.0)
Protein, ur: NEGATIVE mg/dL
RBC / HPF: NONE SEEN RBC/hpf (ref 0–5)
SPECIFIC GRAVITY, URINE: 1.009 (ref 1.005–1.030)

## 2016-08-18 LAB — AMMONIA: Ammonia: 21 umol/L (ref 9–35)

## 2016-08-18 LAB — URINE DRUG SCREEN, QUALITATIVE (ARMC ONLY)
AMPHETAMINES, UR SCREEN: NOT DETECTED
Barbiturates, Ur Screen: NOT DETECTED
Benzodiazepine, Ur Scrn: NOT DETECTED
COCAINE METABOLITE, UR ~~LOC~~: NOT DETECTED
Cannabinoid 50 Ng, Ur ~~LOC~~: NOT DETECTED
MDMA (Ecstasy)Ur Screen: NOT DETECTED
METHADONE SCREEN, URINE: NOT DETECTED
OPIATE, UR SCREEN: NOT DETECTED
PHENCYCLIDINE (PCP) UR S: NOT DETECTED
Tricyclic, Ur Screen: NOT DETECTED

## 2016-08-18 LAB — COMPREHENSIVE METABOLIC PANEL
ALBUMIN: 3.6 g/dL (ref 3.5–5.0)
ALT: 22 U/L (ref 14–54)
ANION GAP: 8 (ref 5–15)
AST: 37 U/L (ref 15–41)
Alkaline Phosphatase: 88 U/L (ref 38–126)
BILIRUBIN TOTAL: 1.1 mg/dL (ref 0.3–1.2)
BUN: 12 mg/dL (ref 6–20)
CO2: 27 mmol/L (ref 22–32)
Calcium: 9.4 mg/dL (ref 8.9–10.3)
Chloride: 104 mmol/L (ref 101–111)
Creatinine, Ser: 0.76 mg/dL (ref 0.44–1.00)
GFR calc Af Amer: >60 mL/min (ref >60)
GFR calc non Af Amer: >60 mL/min (ref >60)
GLUCOSE: 116 mg/dL — AB (ref 65–99)
Potassium: 4.6 mmol/L (ref 3.5–5.1)
Sodium: 139 mmol/L (ref 135–145)
TOTAL PROTEIN: 6.8 g/dL (ref 6.5–8.1)

## 2016-08-18 LAB — TROPONIN I: Troponin I: <0.03 ng/mL (ref <0.03)

## 2016-08-18 LAB — LACTIC ACID, PLASMA: Lactic Acid, Venous: 1.7 mmol/L (ref 0.5–1.9)

## 2016-08-18 LAB — LIPASE, BLOOD: Lipase: 32 U/L (ref 11–51)

## 2016-08-18 LAB — ETHANOL: Alcohol, Ethyl (B): <5 mg/dL (ref <5)

## 2016-08-18 LAB — CK: CK TOTAL: 78 U/L (ref 38–234)

## 2016-08-18 MED ORDER — LISINOPRIL 20 MG PO TABS: 20.0000 mg | ORAL_TABLET | Freq: Every day | ORAL | Status: DC

## 2016-08-18 MED ORDER — STROKE: EARLY STAGES OF RECOVERY BOOK
Freq: Once | Status: AC
  Administered 2016-08-18: 22:00:00

## 2016-08-18 MED ORDER — METOPROLOL SUCCINATE ER 50 MG PO TB24: 100.0000 mg | ORAL_TABLET | Freq: Every day | ORAL | Status: DC

## 2016-08-18 MED ORDER — HYDROCHLOROTHIAZIDE 25 MG PO TABS: 25.0000 mg | ORAL_TABLET | Freq: Every day | ORAL | Status: DC

## 2016-08-18 MED ORDER — METOPROLOL SUCCINATE ER 50 MG PO TB24
100.0000 mg | ORAL_TABLET | Freq: Every day | ORAL | Status: DC
  Administered 2016-08-18 – 2016-08-19 (×2): 100 mg via ORAL
  Filled 2016-08-18 (×2): qty 2

## 2016-08-18 MED ORDER — ATORVASTATIN CALCIUM 20 MG PO TABS
80.0000 mg | ORAL_TABLET | Freq: Every day | ORAL | Status: DC
  Administered 2016-08-18: 80 mg via ORAL
  Filled 2016-08-18: qty 4

## 2016-08-18 MED ORDER — FLUOXETINE HCL 20 MG PO CAPS
40.0000 mg | ORAL_CAPSULE | Freq: Every day | ORAL | Status: DC
  Administered 2016-08-19: 40 mg via ORAL
  Filled 2016-08-18: qty 2

## 2016-08-18 MED ORDER — ACETAMINOPHEN 160 MG/5ML PO SOLN
650.0000 mg | ORAL | Status: DC | PRN
  Filled 2016-08-18: qty 20.3

## 2016-08-18 MED ORDER — ACETAMINOPHEN 650 MG RE SUPP: 650.0000 mg | RECTAL | Status: DC | PRN

## 2016-08-18 MED ORDER — FAMOTIDINE 20 MG PO TABS
20.0000 mg | ORAL_TABLET | Freq: Every day | ORAL | Status: DC
  Administered 2016-08-19: 20 mg via ORAL
  Filled 2016-08-18: qty 1

## 2016-08-18 MED ORDER — HYDROCHLOROTHIAZIDE 25 MG PO TABS
25.0000 mg | ORAL_TABLET | Freq: Every day | ORAL | Status: DC
  Administered 2016-08-19: 25 mg via ORAL
  Filled 2016-08-18: qty 1

## 2016-08-18 MED ORDER — LISINOPRIL-HYDROCHLOROTHIAZIDE 20-25 MG PO TABS: 1.0000 | ORAL_TABLET | Freq: Every day | ORAL | Status: DC

## 2016-08-18 MED ORDER — SODIUM CHLORIDE 0.9 % IV SOLN
INTRAVENOUS | Status: DC
  Administered 2016-08-18: 22:00:00 via INTRAVENOUS

## 2016-08-18 MED ORDER — APIXABAN 5 MG PO TABS
5.0000 mg | ORAL_TABLET | Freq: Two times a day (BID) | ORAL | Status: DC
  Administered 2016-08-18 – 2016-08-19 (×2): 5 mg via ORAL
  Filled 2016-08-18 (×2): qty 1

## 2016-08-18 MED ORDER — ACETAMINOPHEN 325 MG PO TABS
650.0000 mg | ORAL_TABLET | ORAL | Status: DC | PRN
  Administered 2016-08-18: 23:00:00 650 mg via ORAL
  Filled 2016-08-18: qty 2

## 2016-08-18 MED ORDER — ISOSORBIDE MONONITRATE ER 30 MG PO TB24
60.0000 mg | ORAL_TABLET | Freq: Every day | ORAL | Status: DC
  Administered 2016-08-18 – 2016-08-19 (×2): 60 mg via ORAL
  Filled 2016-08-18 (×2): qty 2

## 2016-08-18 MED ORDER — ALBUTEROL SULFATE (2.5 MG/3ML) 0.083% IN NEBU: 2.5000 mg | INHALATION_SOLUTION | Freq: Four times a day (QID) | RESPIRATORY_TRACT | Status: DC | PRN

## 2016-08-18 MED ORDER — LISINOPRIL 20 MG PO TABS
20.0000 mg | ORAL_TABLET | Freq: Every day | ORAL | Status: DC
  Administered 2016-08-18 – 2016-08-19 (×2): 20 mg via ORAL
  Filled 2016-08-18 (×2): qty 1

## 2016-08-18 MED ORDER — SENNOSIDES-DOCUSATE SODIUM 8.6-50 MG PO TABS: 1.0000 | ORAL_TABLET | Freq: Every evening | ORAL | Status: DC | PRN

## 2016-08-18 MED ORDER — ALBUTEROL SULFATE HFA 108 (90 BASE) MCG/ACT IN AERS: 1.0000 | INHALATION_SPRAY | Freq: Four times a day (QID) | RESPIRATORY_TRACT | Status: DC | PRN

## 2016-08-18 NOTE — ED Triage Notes (Signed)
EDP at bedside  

## 2016-08-18 NOTE — ED Triage Notes (Signed)
Pt arrives via EMS from home, sister called because she was unable to reach her, last they spoke was last night and pt was normal, per EMS when they arrived at the home pt was in bed and was only able to say in her name in a slurred manner, pt was not responding, upon arrival to ER pt asks "why am I here", able to say name, staring up at ceiling, states no pain, able to demonstrate weak hand grip

## 2016-08-18 NOTE — ED Notes (Signed)
RN called to inform floor pt is on the way to treatment room. Family updated and pt in NAD at time of transfer.

## 2016-08-18 NOTE — H&P (Signed)
[facility] [place] NAME: Samantha [name]    MR#:  [id number]  DATE OF BIRTH:  01-17-1929  DATE OF ADMISSION:  08/18/2016  PRIMARY CARE PHYSICIAN: Arnetha Courser, MD   REQUESTING/REFERRING PHYSICIAN: Orbie Pyo, MD  CHIEF COMPLAINT:   Chief Complaint  Patient presents with  . Altered Mental Status   Confusion. HISTORY OF PRESENT ILLNESS:  Samantha [name]  is a 83 y.o. female with a known history of A. Fib on Eliquis, CAD, hypertension and CVA. The patient was sent from home to the ED due to confusion. The patient is living alone at home. She was found confused and disoriented by her sister this morning. The patient is more awake in the ED but is still confused. She doesn't know why she is here and denies any symptoms. Workup so far is unremarkable. CT head is unremarkable.   PAST MEDICAL HISTORY:   Past Medical History:  Diagnosis Date  . A-fib ([place]) 06/07/2014  . CAD (coronary artery disease) 06/07/2014  . Depression 09/24/2015  . Dyslipidemia 08/16/2016  . Dysphonia 08/16/2016  . Hypertension   . Stroke [facility])     PAST SURGICAL HISTORY:   Past Surgical History:  Procedure Laterality Date  . ABDOMINAL HYSTERECTOMY    . APPENDECTOMY    . CHOLECYSTECTOMY    . FRACTURE SURGERY      SOCIAL HISTORY:   Social History  Substance Use Topics  . Smoking status: Never Smoker  . Smokeless tobacco: Never Used  . Alcohol use No    FAMILY HISTORY:   Family History  Problem Relation Age of Onset  . Stroke Mother   . Heart attack Father   . Heart disease Sister   . Diabetes Sister     DRUG ALLERGIES:   Allergies  Allergen Reactions  . Iodine Nausea And Vomiting  . Levaquin [Levofloxacin] Other (See Comments)    Pt states she have nose bleeds and pain in arms and shoulders.    REVIEW OF SYSTEMS:   Review of Systems  Unable to perform ROS: Mental status change    MEDICATIONS AT HOME:   Prior to Admission  medications   Medication Sig Start Date End Date Taking? Authorizing Provider  albuterol (PROVENTIL HFA;VENTOLIN HFA) 108 (90 BASE) MCG/ACT inhaler Inhale 1-2 puffs into the lungs every 6 (six) hours as needed for wheezing or shortness of breath (pneumonia).   Yes [provider]  apixaban (ELIQUIS) 5 MG TABS tablet Take 1 tablet (5 mg total) by mouth 2 (two) times daily. 06/08/14  Yes Mody, Ulice Bold, MD  atorvastatin (LIPITOR) 80 MG tablet TAKE 1 TABLET AT BEDTIME. 09/13/14  Yes Lada, Satira Anis, MD  FLUoxetine (PROZAC) 20 MG capsule Take 2 capsules (40 mg total) by mouth daily. 01/19/16  Yes Lada, Satira Anis, MD  isosorbide mononitrate (IMDUR) 60 MG 24 hr tablet Take 60 mg by mouth daily.   Yes [provider]  lisinopril-hydrochlorothiazide (PRINZIDE,ZESTORETIC) 20-25 MG tablet Take 1 tablet by mouth daily. 07/28/16  Yes Sowles, Drue Stager, MD  metoprolol succinate (TOPROL-XL) 100 MG 24 hr tablet Take 1 tablet (100 mg total) by mouth daily. Take with or immediately following a meal. Appointment needed for further refills 08/25/15  Yes Lada, Satira Anis, MD  ranitidine (ZANTAC) 150 MG tablet Take 150 mg by mouth at bedtime.   Yes [provider]  Cholecalciferol (VITAMIN D-1000 MAX ST) 1000 units tablet Take 1,000 Units by mouth daily.  [provider]      VITAL SIGNS:  Blood pressure (!) 186/66, pulse 63, temperature 98.1 F (36.7 C), temperature source Oral, resp. rate 20, height 5\' 4"  (1.626 m), weight 160 lb (72.6 kg), SpO2 97 %.  PHYSICAL EXAMINATION:  Physical Exam  GENERAL:  83 y.o.-year-old patient lying in the bed with no acute distress.  EYES: Pupils equal, round, reactive to light and accommodation. No scleral icterus. Extraocular muscles intact.  HEENT: Head atraumatic, normocephalic. Oropharynx and nasopharynx clear.  NECK:  Supple, no jugular venous distention. No thyroid enlargement, no tenderness.  LUNGS: Normal breath sounds bilaterally, no  wheezing, rales,rhonchi or crepitation. No use of accessory muscles of respiration.  CARDIOVASCULAR: S1, S2 normal. No murmurs, rubs, or gallops.  ABDOMEN: Soft, nontender, nondistended. Bowel sounds present. No organomegaly or mass.  EXTREMITIES: No pedal edema, cyanosis, or clubbing.  NEUROLOGIC: Cranial nerves II through XII are intact. Muscle strength 4/5 in all extremities. Sensation intact. Gait not checked.  PSYCHIATRIC: The patient is alert and awake but confused. SKIN: No obvious rash, lesion, or ulcer.   LABORATORY PANEL:   CBC  Recent Labs Lab 08/18/16 1528  WBC 5.1  HGB 13.9  HCT 40.3  PLT 239   ------------------------------------------------------------------------------------------------------------------  Chemistries   Recent Labs Lab 08/18/16 1528  NA 139  K 4.6  CL 104  CO2 27  GLUCOSE 116*  BUN 12  CREATININE 0.76  CALCIUM 9.4  AST 37  ALT 22  ALKPHOS 88  BILITOT 1.1   ------------------------------------------------------------------------------------------------------------------  Cardiac Enzymes  Recent Labs Lab 08/18/16 1528  TROPONINI <0.03   ------------------------------------------------------------------------------------------------------------------  RADIOLOGY:  Dg Chest 1 View  Result Date: 08/18/2016 CLINICAL DATA:  The altered mental status. Slurred speech. Confusion. EXAM: CHEST 1 VIEW COMPARISON:  Two-view chest x-ray 08/16/2016 FINDINGS: Heart is enlarged. Atherosclerotic calcifications are present at the aortic arch. There is no edema or effusion. The lung volumes are low. No focal airspace disease is present. The visualized soft tissues and bony thorax are unremarkable. IMPRESSION: 1. Stable cardiomegaly without failure. 2. No acute cardiopulmonary disease. Electronically Signed   By: San Morelle M.D.   On: 08/18/2016 16:09   Ct Head Wo Contrast  Result Date: 08/18/2016 CLINICAL DATA:  Slurred speech. Altered  mental status. History of hypertension. History of atrial fibrillation. EXAM: CT HEAD WITHOUT CONTRAST TECHNIQUE: Contiguous axial images were obtained from the base of the skull through the vertex without intravenous contrast. COMPARISON:  11/18/2015. FINDINGS: Brain: No evidence for acute infarction, hemorrhage, mass lesion, hydrocephalus, or extra-axial fluid. Moderately advanced atrophy, not unexpected for age. Hypoattenuation of white matter representing small vessel disease. Vascular: Calcification of the cavernous internal carotid arteries and distal vertebral arteries consistent with cerebrovascular atherosclerotic disease. No signs of intracranial large vessel occlusion. Skull: Normal. Negative for fracture or focal lesion. Sinuses/Orbits: No acute finding. Other: None. IMPRESSION: Atrophy and small vessel disease. No significant change from priors. No acute intracranial findings. Electronically Signed   By: Staci Righter M.D.   On: 08/18/2016 16:22      IMPRESSION AND PLAN:   Altered mental status, rule out CVA. The patient will be placed for observation. Neuro check, follow-up MRI of brain and echocardiogram. PT and OT evaluation, fall and aspiration precautions.  Chronic A. Fib continue Eliquis and lopressor. Hypertension. Continue hypertension medication. Having hydralazine when necessary.   All the records are reviewed and case discussed with ED provider. Management plans discussed with the patient, her sister and niece and they are in  agreement.  CODE STATUS: Full code for now.  TOTAL TIME TAKING CARE OF THIS PATIENT: 55 minutes.    Demetrios Loll M.D on 08/18/2016 at 9:10 PM  Between 7am to 6pm - Pager - [phone]  After 6pm go to [email or web address] - Proofreader  [facility]  Office  [phone]  CC: Primary care physician; Arnetha Courser, MD   Note: This dictation was prepared with Dragon dictation along with smaller phrase technology.  Any transcriptional errors that result from this process are unintentional.

## 2016-08-18 NOTE — ED Notes (Signed)
Pt returned from Ct and xray, resting in bed

## 2016-08-18 NOTE — ED Notes (Signed)
Pt asking for something to drink, per Dr. Bridgett Larsson pt NPO at this time

## 2016-08-18 NOTE — ED Notes (Signed)
Pt resting in bed, family at bedside, aware of pending admission 

## 2016-08-18 NOTE — ED Provider Notes (Signed)
[facility] Emergency Department Provider Note  ____________________________________________   First MD Initiated Contact with Patient 08/18/16 1528     (approximate)  I have reviewed the triage vital signs and the nursing notes.   HISTORY  Chief Complaint No chief complaint on file.   HPI Samantha [name] is a 83 y.o. female with a history of atrial fibrillation as well as coronary artery disease and stroke was presented to the emergency department with altered mental status. Reportedly, per EMS, the last timethat she was acting normally was last night when she spoke to family. However, one tablet was unable to contact her this morning EMS was called and brought her to the emergency department. The patient is unable to give further history. She says no when I ask her if she is having any pain. However, she asks me repeatedly "why am I here?" Patient is on Eliquis for her atrial fibrillation. Patient was found in her bed by EMS. No signs of trauma.   Past Medical History:  Diagnosis Date  . A-fib ([place]) 06/07/2014  . CAD (coronary artery disease) 06/07/2014  . Depression 09/24/2015  . Dyslipidemia 08/16/2016  . Dysphonia 08/16/2016  . Hypertension   . Stroke [facility])     Patient Active Problem List   Diagnosis Date Noted  . Dysphonia 08/16/2016  . Dyslipidemia 08/16/2016  . Forehead trauma, sequela 11/24/2015  . Knee pain, left 11/24/2015  . Rib pain on left side 11/24/2015  . GERD (gastroesophageal reflux disease) 11/24/2015  . Depression 09/24/2015  . Arthralgia of both hands 09/22/2015  . Medication monitoring encounter 09/22/2015  . Headache 09/22/2015  . Fatigue 09/22/2015  . HTN (hypertension) 06/07/2014  . CVA (cerebral infarction) 06/07/2014  . A-fib ([place]) 06/07/2014  . CAD (coronary artery disease) 06/07/2014    Past Surgical History:  Procedure Laterality Date  . ABDOMINAL HYSTERECTOMY    . APPENDECTOMY    . CHOLECYSTECTOMY    . FRACTURE  SURGERY      Prior to Admission medications   Medication Sig Start Date End Date Taking? Authorizing Provider  albuterol (PROVENTIL HFA;VENTOLIN HFA) 108 (90 BASE) MCG/ACT inhaler Inhale 1-2 puffs into the lungs every 6 (six) hours as needed for wheezing or shortness of breath (pneumonia).   Yes [provider]  apixaban (ELIQUIS) 5 MG TABS tablet Take 1 tablet (5 mg total) by mouth 2 (two) times daily. 06/08/14  Yes Mody, Ulice Bold, MD  atorvastatin (LIPITOR) 80 MG tablet TAKE 1 TABLET AT BEDTIME. 09/13/14  Yes Lada, Satira Anis, MD  FLUoxetine (PROZAC) 20 MG capsule Take 2 capsules (40 mg total) by mouth daily. 01/19/16  Yes Lada, Satira Anis, MD  isosorbide mononitrate (IMDUR) 60 MG 24 hr tablet Take 60 mg by mouth daily.   Yes [provider]  lisinopril-hydrochlorothiazide (PRINZIDE,ZESTORETIC) 20-25 MG tablet Take 1 tablet by mouth daily. 07/28/16  Yes Sowles, Drue Stager, MD  metoprolol succinate (TOPROL-XL) 100 MG 24 hr tablet Take 1 tablet (100 mg total) by mouth daily. Take with or immediately following a meal. Appointment needed for further refills 08/25/15  Yes Lada, Satira Anis, MD  ranitidine (ZANTAC) 150 MG tablet Take 150 mg by mouth at bedtime.   Yes [provider]  Cholecalciferol (VITAMIN D-1000 MAX ST) 1000 units tablet Take 1,000 Units by mouth daily.    [provider]    Allergies Iodine and Levaquin [levofloxacin]  Family History  Problem Relation Age of Onset  . Stroke Mother   . Heart  attack Father   . Heart disease Sister   . Diabetes Sister     Social History Social History  Substance Use Topics  . Smoking status: Never Smoker  . Smokeless tobacco: Never Used  . Alcohol use No    Review of Systems  Constitutional: No fever/chills Eyes: No visual changes. ENT: No sore throat. Cardiovascular: Denies chest pain. Respiratory: Denies shortness of breath. Gastrointestinal: No abdominal pain.  No nausea, no vomiting.  No diarrhea.  No  constipation. Genitourinary: Negative for dysuria. Musculoskeletal: Negative for back pain. Skin: Negative for rash. Neurological: Negative for headaches, focal weakness or numbness.   ____________________________________________   PHYSICAL EXAM:  VITAL SIGNS: ED Triage Vitals  Enc Vitals Group     BP 08/18/16 1518 (!) 182/85     Pulse Rate 08/18/16 1518 65     Resp 08/18/16 1518 16     Temp 08/18/16 1518 98.6 F (37 C)     Temp Source 08/18/16 1518 Oral     SpO2 08/18/16 1518 95 %     Weight 08/18/16 1522 160 lb (72.6 kg)     Height 08/18/16 1522 5\' 4"  (1.626 m)     Head Circumference --      Peak Flow --      Pain Score --      Pain Loc --      Pain Edu? --      Excl. in [place]? --     Constitutional: Alert .  in no acute distress. Eyes: Conjunctivae are normal.  Head: Atraumatic. Nose: No congestion/rhinnorhea. Mouth/Throat: Mucous membranes are moist.  Neck: No stridor.   Cardiovascular: Normal rate, regular rhythm. Grossly normal heart sounds.   Respiratory: Normal respiratory effort.  No retractions. Lungs CTAB. Gastrointestinal: Soft and nontender. No distention.  Musculoskeletal: No lower extremity tenderness nor edema.  No joint effusions. Neurologic:  Stuttering speech. the patient is confused and keeps asking "why am I here?"  No gross focal neurologic deficits are appreciated.  Patient moves all 4 extremities and is without any facial droop. Skin:  Skin is warm, dry and intact. No rash noted. Psychiatric: Mood and affect are normal. Speech and behavior are normal.  ____________________________________________   LABS (all labs ordered are listed, but only abnormal results are displayed)  Labs Reviewed  CBC WITH DIFFERENTIAL/PLATELET  COMPREHENSIVE METABOLIC PANEL  LIPASE, BLOOD  URINALYSIS, COMPLETE (UACMP) WITH MICROSCOPIC  CK  TROPONIN I  AMMONIA  LACTIC ACID, PLASMA  LACTIC ACID, PLASMA   ____________________________________________  EKG  ED  ECG REPORT I, Ellianah Cordy,  Youlanda Roys, the attending physician, personally viewed and interpreted this ECG.   Date: 08/18/2016  EKG Time: 1516  Rate: 63  Rhythm: normal sinus rhythm. PVC times one.  Axis: Normal  Intervals:none  ST&T Change: No ST segment elevation or depression. Singel T-wave inversion in V2.  ____________________________________________  RADIOLOGY  CT of the head without any acute process. Chest x-ray without any acute process. ____________________________________________   PROCEDURES  Procedure(s) performed:   Procedures  Critical Care performed:   ____________________________________________   INITIAL IMPRESSION / ASSESSMENT AND PLAN / ED COURSE  Pertinent labs & imaging results that were available during my care of the patient were reviewed by me and considered in my medical decision making (see chart for details).  Patient is confused without any focal neurologic deficit. Out of window for thrombolytic therapy.    ----------------------------------------- 5:35 PM on 08/18/2016 -----------------------------------------  Family is now the bedside and says that her  speech is improved but she is still confused and asking "why am I here?" Family because of the patient is a baseline slurring of speech from a stroke from several years ago. They reassuring lab work.  Family also reported the patient has a history of a stroke. Patient will be admitted to the hospital for further workup. Patient family understanding of the plan. Signed out to Dr. Bridgett Larsson.  ____________________________________________   FINAL CLINICAL IMPRESSION(S) / ED DIAGNOSES  Altered mental status.    NEW MEDICATIONS STARTED DURING THIS VISIT:  New Prescriptions   No medications on file     Note:  This document was prepared using Dragon voice recognition software and may include unintentional dictation errors.     Orbie Pyo, MD 08/18/16 [phone]

## 2016-08-18 NOTE — ED Notes (Signed)
Patient transported to CT 

## 2016-08-19 ENCOUNTER — Observation Stay
Admit: 2016-08-19 | Discharge: 2016-08-19 | Disposition: A | Discharge status: Home / Self Care | Payer: Medicare Other | Attending: Internal Medicine | Admitting: Internal Medicine

## 2016-08-19 DIAGNOSIS — I10 Essential (primary) hypertension: Secondary | ICD-10-CM | POA: Diagnosis not present

## 2016-08-19 DIAGNOSIS — R4182 Altered mental status, unspecified: Secondary | ICD-10-CM | POA: Diagnosis not present

## 2016-08-19 DIAGNOSIS — I482 Chronic atrial fibrillation: Secondary | ICD-10-CM | POA: Diagnosis not present

## 2016-08-19 DIAGNOSIS — R9431 Abnormal electrocardiogram [ECG] [EKG]: Secondary | ICD-10-CM | POA: Diagnosis not present

## 2016-08-19 LAB — ECHOCARDIOGRAM COMPLETE
Height: 60 in
WEIGHTICAEL: 2321.6 [oz_av]

## 2016-08-19 NOTE — Discharge Summary (Signed)
[facility] NAME: Tonea Leiphart    MR#:  [id number]  DATE OF BIRTH:  March 09, 1928  DATE OF ADMISSION:  08/18/2016 ADMITTING PHYSICIAN: Demetrios Loll, MD  DATE OF DISCHARGE: 08/19/16  PRIMARY CARE PHYSICIAN: Arnetha Courser, MD    ADMISSION DIAGNOSIS:  Altered mental status, unspecified altered mental status type [R41.82]  DISCHARGE DIAGNOSIS:  Altered mental status resolved etiology unclear  SECONDARY DIAGNOSIS:   Past Medical History:  Diagnosis Date  . A-fib ([place]) 06/07/2014  . CAD (coronary artery disease) 06/07/2014  . Depression 09/24/2015  . Dyslipidemia 08/16/2016  . Dysphonia 08/16/2016  . Hypertension   . Stroke [facility])     HOSPITAL COURSE:   Lestine Rahe  is a 83 y.o. female with a known history of A. Fib on Eliquis, CAD, hypertension and CVA. The patient was sent from home to the ED due to confusion. The patient is living alone at home. She was found confused and disoriented by her sister this morning.  1. altered mental status/confusion resolved  -Etiology unclear.  -Neuro workup with CT head and MRI of the brain negative other than chronic atherosclerotic/stenosis noted in posterior cerebral and minimal sclerosis noted in middle cerebral.  -No neuro deficit.  -Patient states lately she has been feeling tired and has been sleeping and could have slept hard when her sister tried to call.  -Telemetry remains in sinus rhythm   2. Chronic A. fib rate controlled on oral anticoagulation   3. Hypertension  -Continue losartan/hydrochlorothiazide  4. Hyperlipidemia continue Lipitor   5. DVT prophylaxis on Eliquis  6. Physical therapy to see patient. Care management for discharge planning.  CONSULTS OBTAINED:    DRUG ALLERGIES:   Allergies  Allergen Reactions  . Iodine Nausea And Vomiting  . Levaquin [Levofloxacin] Other (See Comments)    Pt states she have nose bleeds and pain in arms and shoulders.     DISCHARGE MEDICATIONS:   Current Discharge Medication List    CONTINUE these medications which have NOT CHANGED   Details  albuterol (PROVENTIL HFA;VENTOLIN HFA) 108 (90 BASE) MCG/ACT inhaler Inhale 1-2 puffs into the lungs every 6 (six) hours as needed for wheezing or shortness of breath (pneumonia).    apixaban (ELIQUIS) 5 MG TABS tablet Take 1 tablet (5 mg total) by mouth 2 (two) times daily. Qty: 60 tablet, Refills: 0    atorvastatin (LIPITOR) 80 MG tablet TAKE 1 TABLET AT BEDTIME. Qty: 90 tablet, Refills: 1    FLUoxetine (PROZAC) 20 MG capsule Take 2 capsules (40 mg total) by mouth daily. Qty: 60 capsule, Refills: 5    isosorbide mononitrate (IMDUR) 60 MG 24 hr tablet Take 60 mg by mouth daily.    lisinopril-hydrochlorothiazide (PRINZIDE,ZESTORETIC) 20-25 MG tablet Take 1 tablet by mouth daily. Qty: 30 tablet, Refills: 0    metoprolol succinate (TOPROL-XL) 100 MG 24 hr tablet Take 1 tablet (100 mg total) by mouth daily. Take with or immediately following a meal. Appointment needed for further refills Qty: 30 tablet, Refills: 0    ranitidine (ZANTAC) 150 MG tablet Take 150 mg by mouth at bedtime.    Cholecalciferol (VITAMIN D-1000 MAX ST) 1000 units tablet Take 1,000 Units by mouth daily.        If you experience worsening of your admission symptoms, develop shortness of breath, life threatening emergency, suicidal or homicidal thoughts you must seek medical attention immediately by calling 911 or calling your MD immediately  if symptoms  less severe.  You Must read complete instructions/literature along with all the possible adverse reactions/side effects for all the Medicines you take and that have been prescribed to you. Take any new Medicines after you have completely understood and accept all the possible adverse reactions/side effects.   Please note  You were cared for by a hospitalist during your hospital stay. If you have any questions about your discharge  medications or the care you received while you were in the hospital after you are discharged, you can call the unit and asked to speak with the hospitalist on call if the hospitalist that took care of you is not available. Once you are discharged, your primary care physician will handle any further medical issues. Please note that NO REFILLS for any discharge medications will be authorized once you are discharged, as it is imperative that you return to your primary care physician (or establish a relationship with a primary care physician if you do not have one) for your aftercare needs so that they can reassess your need for medications and monitor your lab values. Today   SUBJECTIVE   Sitting up in the bed eating breakfast. Appears alert and oriented 3. Remembers going to sleep and then he woken up by EMS. Denies any complaints.   VITAL SIGNS:  Blood pressure (!) 134/57, pulse 68, temperature 97.8 F (36.6 C), temperature source Oral, resp. rate 18, height 5' (1.524 m), weight 65.8 kg (145 lb 1.6 oz), SpO2 96 %.  I/O:   Intake/Output Summary (Last 24 hours) at 08/19/16 1003 Last data filed at 08/19/16 0308  Gross per 24 hour  Intake           413.75 ml  Output                0 ml  Net           413.75 ml    PHYSICAL EXAMINATION:  GENERAL:  83 y.o.-year-old patient lying in the bed with no acute distress.  EYES: Pupils equal, round, reactive to light and accommodation. No scleral icterus. Extraocular muscles intact.  HEENT: Head atraumatic, normocephalic. Oropharynx and nasopharynx clear.  NECK:  Supple, no jugular venous distention. No thyroid enlargement, no tenderness.  LUNGS: Normal breath sounds bilaterally, no wheezing, rales,rhonchi or crepitation. No use of accessory muscles of respiration.  CARDIOVASCULAR: S1, S2 normal. No murmurs, rubs, or gallops.  ABDOMEN: Soft, non-tender, non-distended. Bowel sounds present. No organomegaly or mass.  EXTREMITIES: No pedal edema, cyanosis,  or clubbing.  NEUROLOGIC: Cranial nerves II through XII are intact. Muscle strength 5/5 in all extremities. Sensation intact. Gait not checked.  PSYCHIATRIC: The patient is alert and oriented x 3.  SKIN: No obvious rash, lesion, or ulcer.   DATA REVIEW:   CBC   Recent Labs Lab 08/18/16 1528  WBC 5.1  HGB 13.9  HCT 40.3  PLT 239    Chemistries   Recent Labs Lab 08/18/16 1528  NA 139  K 4.6  CL 104  CO2 27  GLUCOSE 116*  BUN 12  CREATININE 0.76  CALCIUM 9.4  AST 37  ALT 22  ALKPHOS 88  BILITOT 1.1    Microbiology Results   No results found for this or any previous visit (from the past 240 hour(s)).  RADIOLOGY:  Dg Chest 1 View  Result Date: 08/18/2016 CLINICAL DATA:  The altered mental status. Slurred speech. Confusion. EXAM: CHEST 1 VIEW COMPARISON:  Two-view chest x-ray 08/16/2016 FINDINGS: Heart is enlarged. Atherosclerotic calcifications  are present at the aortic arch. There is no edema or effusion. The lung volumes are low. No focal airspace disease is present. The visualized soft tissues and bony thorax are unremarkable. IMPRESSION: 1. Stable cardiomegaly without failure. 2. No acute cardiopulmonary disease. Electronically Signed   By: San Morelle M.D.   On: 08/18/2016 16:09   Ct Head Wo Contrast  Result Date: 08/18/2016 CLINICAL DATA:  Slurred speech. Altered mental status. History of hypertension. History of atrial fibrillation. EXAM: CT HEAD WITHOUT CONTRAST TECHNIQUE: Contiguous axial images were obtained from the base of the skull through the vertex without intravenous contrast. COMPARISON:  11/18/2015. FINDINGS: Brain: No evidence for acute infarction, hemorrhage, mass lesion, hydrocephalus, or extra-axial fluid. Moderately advanced atrophy, not unexpected for age. Hypoattenuation of white matter representing small vessel disease. Vascular: Calcification of the cavernous internal carotid arteries and distal vertebral arteries consistent with  cerebrovascular atherosclerotic disease. No signs of intracranial large vessel occlusion. Skull: Normal. Negative for fracture or focal lesion. Sinuses/Orbits: No acute finding. Other: None. IMPRESSION: Atrophy and small vessel disease. No significant change from priors. No acute intracranial findings. Electronically Signed   By: Staci Righter M.D.   On: 08/18/2016 16:22   Mr Brain Wo Contrast  Result Date: 08/19/2016 CLINICAL DATA:  Confusion. EXAM: MRI HEAD WITHOUT CONTRAST MRA HEAD WITHOUT CONTRAST TECHNIQUE: Multiplanar, multiecho pulse sequences of the brain and surrounding structures were obtained without intravenous contrast. Angiographic images of the head were obtained using MRA technique without contrast. COMPARISON:  Head CT 08/18/2016 FINDINGS: MRI HEAD FINDINGS Brain: The midline structures are normal. No focal diffusion restriction to indicate acute infarct. No intraparenchymal hemorrhage. There is early confluent hyperintense T2-weighted signal within the periventricular, deep subcortical white matter, most often seen in the setting of chronic microvascular ischemia. No mass lesion. No chronic microhemorrhage or cerebral amyloid angiopathy. No hydrocephalus, age advanced atrophy or lobar predominant volume loss. No dural abnormality or extra-axial collection. Skull and upper cervical spine: The visualized skull base, calvarium, upper cervical spine and extracranial soft tissues are normal. Sinuses/Orbits: No fluid levels or advanced mucosal thickening. No mastoid effusion. Normal orbits. MRA HEAD FINDINGS Intracranial internal carotid arteries: Normal. Anterior cerebral arteries: Normal. Middle cerebral arteries: Multifocal mild to moderate narrowing of the right MCA M2 branches. Posterior communicating arteries: Absent bilaterally Posterior cerebral arteries: Severe stenosis of the right PCA P2 segment. Moderate to severe stenosis of the proximal left P2 segment. Basilar artery: Normal. Vertebral  arteries: Left dominant. Normal. Superior cerebellar arteries: Normal. Anterior inferior cerebellar arteries: Normal. Posterior inferior cerebellar arteries: Normal. IMPRESSION: 1. No acute intracranial abnormality. 2. Severe right and moderate to severe stenosis of both P2 segments of the posterior cerebral arteries. 3. Findings of advanced chronic microvascular ischemia. Electronically Signed   By: Ulyses Jarred M.D.   On: 08/19/2016 03:17   Mr Jodene Nam Head/brain EP Cm  Result Date: 08/19/2016 CLINICAL DATA:  Confusion. EXAM: MRI HEAD WITHOUT CONTRAST MRA HEAD WITHOUT CONTRAST TECHNIQUE: Multiplanar, multiecho pulse sequences of the brain and surrounding structures were obtained without intravenous contrast. Angiographic images of the head were obtained using MRA technique without contrast. COMPARISON:  Head CT 08/18/2016 FINDINGS: MRI HEAD FINDINGS Brain: The midline structures are normal. No focal diffusion restriction to indicate acute infarct. No intraparenchymal hemorrhage. There is early confluent hyperintense T2-weighted signal within the periventricular, deep subcortical white matter, most often seen in the setting of chronic microvascular ischemia. No mass lesion. No chronic microhemorrhage or cerebral amyloid angiopathy. No hydrocephalus, age advanced atrophy  or lobar predominant volume loss. No dural abnormality or extra-axial collection. Skull and upper cervical spine: The visualized skull base, calvarium, upper cervical spine and extracranial soft tissues are normal. Sinuses/Orbits: No fluid levels or advanced mucosal thickening. No mastoid effusion. Normal orbits. MRA HEAD FINDINGS Intracranial internal carotid arteries: Normal. Anterior cerebral arteries: Normal. Middle cerebral arteries: Multifocal mild to moderate narrowing of the right MCA M2 branches. Posterior communicating arteries: Absent bilaterally Posterior cerebral arteries: Severe stenosis of the right PCA P2 segment. Moderate to severe  stenosis of the proximal left P2 segment. Basilar artery: Normal. Vertebral arteries: Left dominant. Normal. Superior cerebellar arteries: Normal. Anterior inferior cerebellar arteries: Normal. Posterior inferior cerebellar arteries: Normal. IMPRESSION: 1. No acute intracranial abnormality. 2. Severe right and moderate to severe stenosis of both P2 segments of the posterior cerebral arteries. 3. Findings of advanced chronic microvascular ischemia. Electronically Signed   By: Ulyses Jarred M.D.   On: 08/19/2016 03:17     Management plans discussed with the patient, family and they are in agreement.  CODE STATUS:     Code Status Orders        Start     Ordered   08/18/16 2102  Full code  Continuous     08/18/16 2101    Code Status History    Date Active Date Inactive Code Status Order ID Comments User Context   06/07/2014  7:31 PM 06/08/2014  3:05 PM Full Code [id number]  Demetrios Loll, MD Inpatient    Advance Directive Documentation     Most Recent Value  Type of Advance Directive  Healthcare Power of Attorney  Pre-existing out of facility DNR order (yellow form or pink MOST form)  -  "MOST" Form in Place?  -      TOTAL TIME TAKING CARE OF THIS PATIENT: *40* minutes.    Ludivina Guymon M.D on 08/19/2016 at 10:03 AM  Between 7am to 6pm - Pager - [phone] After 6pm go to [email or web address] - password [email or web address] [facility]  Office  [phone]  CC: Primary care physician; Arnetha Courser, MD

## 2016-08-19 NOTE — Care Management Note (Signed)
Case Management Note  Patient Details  Name: Samantha [name] MRN: [id number] Date of Birth: 1928-04-29  Subjective/Objective:  Admitted to [facility] under observation status with the diagnosis of altered mental status.  Lives alone. Sister is Lavell Luster [phone]). Last seen Dr. Sanda Klein 08/16/16. Prescriptions are filled at [facility]. Home health in the past, Can't remember name of agency. No skilled facility. No home oxygen.   Can and  grab bars in the home. Lives in handicap apartment. No Life Alert. Takes care of all basic activities of daily living herself, doesn't drive. Sister helps with errands. Last fall was "not too long ago." Good appetite. Sister will transport.             Action/Plan: MR - Physical therapy evaluation pending. Neuro consult   Expected Discharge Date:                  Expected Discharge Plan:     In-House Referral:     Discharge planning Services     Post Acute Care Choice:    Choice offered to:     DME Arranged:    DME Agency:     HH Arranged:    HH Agency:     Status of Service:     If discussed at [employer], dates discussed:    Additional Comments:  [name], RN MSN CCM Care Management [phone] 08/19/2016, 9:03 AM

## 2016-08-19 NOTE — Care Management (Signed)
Discharge to home today per Dr. Posey Pronto. Physical therapy evaluation completed. Recommending home with home health and physical. Discussed with [name]. Declining these services at this time. Nurse Skylar updated.  [name] RN MSN CCM Care Management [phone]

## 2016-08-19 NOTE — Evaluation (Signed)
Physical Therapy Evaluation Patient Details Name: Samantha [name] MRN: [id number] DOB: 06-25-1928 Today's Date: 08/19/2016   History of Present Illness  Pt is an 83 y.o. F who presents to ED with altered mental status, decreased grip strength and slurred speach. Pt admitted 08/18/2016 due to altered mental status. PMH: recent fall, CVA, A-fib, CAD, depression, dyslipidemia, dysphonia, HTN, GERD, and L elbow and wrist fracture.    Clinical Impression  Prior to admission pt reports ambulating short distances around the home without the use of an AD; requires [facility] when ambulating in public. Pt's sister is able to check on her daily as needed upon discharge. Pt currently requires CGA with SPC for transfers, ambulation and navigating stairs. Pt denies pain throughout evaluation. Pt will benefit from skilled PT to address strength, balance, activity tolerance, and functional mobility (see below). Recommended discharge to home with HHPT.    Follow Up Recommendations Home health PT    Equipment Recommendations  Cane    Recommendations for Other Services       Precautions / Restrictions Precautions Precautions: Fall      Mobility  Bed Mobility Overal bed mobility: Modified Independent Bed Mobility: Supine to Sit;Sit to Supine     Supine to sit: Modified independent (Device/Increase time);HOB elevated Sit to supine: Modified independent (Device/Increase time);HOB elevated   General bed mobility comments: pt able to perform supine to sit and sit to supine with increased time/effort with HOB elevated  Transfers Overall transfer level: Needs assistance Equipment used: Straight cane Transfers: Sit to/from Stand Sit to Stand: Min guard         General transfer comment: pt able to perform sit to/from stand with CGA and SPC for safety; vc's for B feet flat on ground before performing sit to stand  Ambulation/Gait Ambulation/Gait assistance: Min guard     Gait Pattern/deviations:  Decreased stance time - right;Decreased step length - right;Decreased step length - left Gait velocity: decreased   General Gait Details: pt able to ambulate 50 feet to the stairs with CGA and SPC (in R hand) while occassionally reaching to hold onto objects (railing, wall, etc) with the L hand (pt reports this as baseline); pt able to ambulate 50 feet back from the stairs to EOB with CGA and SPC (in R hand) while occassionally reaching with her L hand; pt demonstrates with SOB likely due to deconditioning as well as decreased stance time on the right, decreased B step length, and decreased gait speed  Stairs Stairs: Yes Stairs assistance: Min guard Stair Management: One rail Left;Forwards Number of Stairs: 6 General stair comments: pt able to navigate stairs with CGA, use of SPC (in R hand), and use of L hand rail with step to pattern  Wheelchair Mobility    Modified Rankin (Stroke Patients Only)       Balance Overall balance assessment: Needs assistance Sitting-balance support: No upper extremity supported;Feet supported Sitting balance-Leahy Scale: Good Sitting balance - Comments: pt able to maintain static sitting balance as well as reach for objects within base of support with no overt loss of balance noted with no UE support and B LE support   Standing balance support: Single extremity supported Standing balance-Leahy Scale: Poor Standing balance comment: pt requires use of SPC to maintain balance and demonstrates difficulty maintaining balance with large amplitude standing marches; improved with small amplitude standing marches with less weight shift  Pertinent Vitals/Pain Pain Assessment: No/denies pain  HR - 66 to 79 bpm throughout session via pulse ox O2 - 96 to 98% on RA throughout session via pulse ox    Home Living Family/patient expects to be discharged to:: Private residence Living Arrangements: Alone Available Help at  Discharge: Family;Available PRN/intermittently Type of Home: Apartment Home Access: Stairs to enter Entrance Stairs-Rails: Right;Left Entrance Stairs-Number of Steps: 7 (rails on both [place] but can't reach both) Home Layout: One level Home Equipment: Cane - single point;Shower seat;Grab bars - toilet;Grab bars - tub/shower      Prior Function Level of Independence: Independent with assistive device(s)         Comments: independent with ADLs with use of SPC as needed     Hand Dominance   Dominant Hand: Right    Extremity/Trunk Assessment   Upper Extremity Assessment Upper Extremity Assessment: Generalized weakness    Lower Extremity Assessment Lower Extremity Assessment: Generalized weakness    Cervical / Trunk Assessment Cervical / Trunk Assessment: Normal  Communication   Communication: HOH  Cognition Arousal/Alertness: Awake/alert Behavior During Therapy: WFL for tasks assessed/performed Overall Cognitive Status: Within Functional Limits for tasks assessed                                        General Comments      Exercises     Assessment/Plan    PT Assessment Patient needs continued PT services  PT Problem List Decreased strength;Decreased activity tolerance;Decreased balance;Decreased mobility;Decreased knowledge of use of DME       PT Treatment Interventions DME instruction;Gait training;Stair training;Functional mobility training;Therapeutic activities;Therapeutic exercise;Balance training;Patient/family education    PT Goals (Current goals can be found in the Care Plan section)  Acute Rehab PT Goals Patient Stated Goal: to return home PT Goal Formulation: With patient Time For Goal Achievement: 09/02/16 Potential to Achieve Goals: Good    Frequency Min 2X/week   Barriers to discharge        Co-evaluation               AM-PAC PT "6 Clicks" Daily Activity  Outcome Measure Difficulty turning over in bed (including  adjusting bedclothes, sheets and blankets)?: A Little Difficulty moving from lying on back to sitting on the side of the bed? : A Little Difficulty sitting down on and standing up from a chair with arms (e.g., wheelchair, bedside commode, etc,.)?: A Little Help needed moving to and from a bed to chair (including a wheelchair)?: A Little Help needed walking in hospital room?: A Little Help needed climbing 3-5 steps with a railing? : A Little 6 Click Score: 18    End of Session Equipment Utilized During Treatment: Gait belt Activity Tolerance: Patient tolerated treatment well (mild SOB) Patient left: in bed;with call bell/phone within reach;with bed alarm set;with family/visitor present   PT Visit Diagnosis: Unsteadiness on feet (R26.81);Other abnormalities of gait and mobility (R26.89);Muscle weakness (generalized) (M62.81);History of falling (Z91.81)    Time: 8416-6063 PT Time Calculation (min) (ACUTE ONLY): 25 min   Charges:         PT G CodesLaqueta Carina, SPT 08/30/16,3:57 PM [phone]

## 2016-08-19 NOTE — Care Management Obs Status (Signed)
[facility] [place] NOTIFICATION   Patient Details  Name: HANIN DECOOK MRN: [id number] Date of Birth: [birth year]   Medicare Observation Status Notification Given:  Yes    [name], RN 08/19/2016, 9:11 AM

## 2016-08-19 NOTE — Progress Notes (Signed)
Fall risk Yellow arm band on patient. Allergie Red arm band on patient.

## 2016-08-19 NOTE — Progress Notes (Signed)
*  PRELIMINARY RESULTS* Echocardiogram 2D Echocardiogram has been performed.  Sherrie Sport 08/19/2016, 2:10 PM

## 2016-08-19 NOTE — Progress Notes (Signed)
SLP Cancellation Note  Patient Details Name: DRINDA BELGARD MRN: [id number] DOB: [birth year]   Cancelled treatment:       Reason Eval/Treat Not Completed: SLP screened, no needs identified, will sign off (chart reviewed; consulted NSG and met w/ pt)  Pt denied any difficulty swallowing and is currently on a regular diet; tolerates swallowing pills w/ water per NSG. Pt conversed at conversational level w/out language deficits noted; pt stated she does have Dysfluency (of speech) c/b mild hesitations when talking about new information or feeling stressed(this admission is stressful to her she said d/t having a wedding to attend this weekend). She denied any other speech-language deficits and followed along in conversation easily and answered all questions accurately. No further skilled ST services indicated as pt appears at her baseline. Pt agreed. NSG to reconsult if any change in status. MD agreed.   Orinda Kenner, MS, CCC-SLP Anylah Scheib 08/19/2016, 9:43 AM

## 2016-08-19 NOTE — Progress Notes (Signed)
ECHO notified patient is pending discharge today once test is completed & seen by PT.

## 2016-08-22 ENCOUNTER — Encounter: Payer: Self-pay | Admitting: Family Medicine

## 2016-08-22 DIAGNOSIS — I350 Nonrheumatic aortic (valve) stenosis: Secondary | ICD-10-CM | POA: Insufficient documentation

## 2016-08-22 DIAGNOSIS — I340 Nonrheumatic mitral (valve) insufficiency: Secondary | ICD-10-CM

## 2016-08-22 HISTORY — DX: Nonrheumatic aortic (valve) stenosis: I35.0

## 2016-08-22 HISTORY — DX: Nonrheumatic mitral (valve) insufficiency: I34.0

## 2016-08-26 ENCOUNTER — Encounter: Payer: Self-pay | Admitting: Family Medicine

## 2016-08-26 ENCOUNTER — Ambulatory Visit (INDEPENDENT_AMBULATORY_CARE_PROVIDER_SITE_OTHER): Payer: Medicare Other | Admitting: Family Medicine

## 2016-08-26 VITALS — BP 108/62 | HR 85 | Temp 97.4°F | Resp 14 | Wt 145.7 lb

## 2016-08-26 DIAGNOSIS — R404 Transient alteration of awareness: Secondary | ICD-10-CM | POA: Diagnosis not present

## 2016-08-26 DIAGNOSIS — I340 Nonrheumatic mitral (valve) insufficiency: Secondary | ICD-10-CM | POA: Diagnosis not present

## 2016-08-26 DIAGNOSIS — R739 Hyperglycemia, unspecified: Secondary | ICD-10-CM | POA: Diagnosis not present

## 2016-08-26 DIAGNOSIS — I25111 Atherosclerotic heart disease of native coronary artery with angina pectoris with documented spasm: Secondary | ICD-10-CM | POA: Diagnosis not present

## 2016-08-26 DIAGNOSIS — I350 Nonrheumatic aortic (valve) stenosis: Secondary | ICD-10-CM

## 2016-08-26 LAB — POCT GLYCOSYLATED HEMOGLOBIN (HGB A1C): HEMOGLOBIN A1C: 5.8

## 2016-08-26 MED ORDER — ATORVASTATIN CALCIUM 40 MG PO TABS: 80.0000 mg | ORAL_TABLET | Freq: Every day | ORAL | 1 refills | Status: DC

## 2016-08-26 MED ORDER — LISINOPRIL-HYDROCHLOROTHIAZIDE 20-12.5 MG PO TABS: 1.0000 | ORAL_TABLET | Freq: Every day | ORAL | 3 refills | Status: DC

## 2016-08-26 NOTE — Patient Instructions (Addendum)
Please do see Dr. Tomi Likens Do call if needed Check in with family frequently Stop your current lisinopril/hctz 20/25 Start the new lower dose of lisinopril/hctz 20/12.5 Return in 3 weeks for blood pressure recheck

## 2016-08-26 NOTE — Progress Notes (Signed)
BP 108/62   Pulse 85   Temp (!) 97.4 F (36.3 C) (Oral)   Resp 14   Wt 145 lb 11.2 oz (66.1 kg)   SpO2 94%   BMI 28.46 kg/m    Subjective:    Patient ID: Samantha [name], female    DOB: October 13, 1928, 83 y.o.   MRN: [id number]  HPI: Samantha [name] is a 83 y.o. female  Chief Complaint  Patient presents with  . Hospitalization Follow-up    confusion    HPI Patient is here for hospital f/u Sister had to break in to get into her home, but screens were locked; had key for main door She banged and rang door bell; sister called police; laying on the bed, not even conscious of anything, unresponsive to paramedics She has a round thing by her sink for her medicines, she has [employer] sleeping pills but has not taken any of that at all; they might have taken that; she is sleeping too much They hospitalized her Discharge summary reviewed D/C diagnosis: altered mental status, etiology unclear She goes to see neurologist soon I asked about confusion; did not remember canceling the appt She was supposed to have an EKG, then realized she already had one at the hospital Neuro work-up CT scan and brain MRI Echo reviewed, mild regurg, very mild aortic stenosis  Depression screen [facility] 2/9 08/26/2016 08/16/2016 11/24/2015 10/20/2015 09/22/2015  Decreased Interest 0 0 0 1 3  Down, Depressed, Hopeless 1 1 3 1 3   PHQ - 2 Score 1 1 3 2 6   Altered sleeping - - 3 1 3   Tired, decreased energy - - 3 2 3   Change in appetite - - 0 0 1  Feeling bad or failure about yourself  - - 3 1 3   Trouble concentrating - - 3 0 0  Moving slowly or fidgety/restless - - 3 1 1   Suicidal thoughts - - 3 0 3  PHQ-9 Score - - 21 7 20   Difficult doing work/chores - - Extremely dIfficult Very difficult Extremely dIfficult    Relevant past medical, surgical, family and social history reviewed Past Medical History:  Diagnosis Date  . A-fib ([place]) 06/07/2014  . CAD (coronary artery disease) 06/07/2014  . Depression 09/24/2015    . Dyslipidemia 08/16/2016  . Dysphonia 08/16/2016  . Hypertension   . Mild aortic stenosis 08/22/2016   Echo July 2018  . Mitral regurgitation 08/22/2016   Echo July 2018  . Stroke [facility])    Past Surgical History:  Procedure Laterality Date  . ABDOMINAL HYSTERECTOMY    . APPENDECTOMY    . CHOLECYSTECTOMY    . FRACTURE SURGERY     Family History  Problem Relation Age of Onset  . Stroke Mother   . Heart attack Father   . Heart disease Sister   . Diabetes Sister   . Heart disease Brother   . Heart attack Brother   . Diabetes Maternal Grandfather   . Ovarian cancer Paternal Grandmother   . Cancer Brother        bladder cancer  . Diabetes Brother   . Heart disease Brother   . Hypertension Brother    Social History   Social History  . Marital status: Married    Spouse name: N/A  . Number of children: N/A  . Years of education: N/A   Occupational History  . Not on file.   Social History Main Topics  . Smoking status: Never Smoker  . Smokeless  tobacco: Never Used  . Alcohol use No  . Drug use: No  . Sexual activity: No   Other Topics Concern  . Not on file   Social History Narrative  . No narrative on file    Interim medical history since last visit reviewed. Allergies and medications reviewed  Review of Systems Per HPI unless specifically indicated above     Objective:    BP 108/62   Pulse 85   Temp (!) 97.4 F (36.3 C) (Oral)   Resp 14   Wt 145 lb 11.2 oz (66.1 kg)   SpO2 94%   BMI 28.46 kg/m   Wt Readings from Last 3 Encounters:  08/26/16 145 lb 11.2 oz (66.1 kg)  08/18/16 145 lb 1.6 oz (65.8 kg)  08/16/16 144 lb 11.2 oz (65.6 kg)    Physical Exam  Constitutional: She appears well-developed and well-nourished. No distress.  Elderly, appears deconditioned, rather frail though wearing bright colors, make-up, and hair done up, still elderly and frail appearing  HENT:  Head: Normocephalic and atraumatic.  Voice is hesitating and weak and  quivering;   Eyes: EOM are normal. No scleral icterus.  Neck: No thyromegaly present.  Cardiovascular: Normal rate, regular rhythm and normal heart sounds.   No murmur heard. Pulmonary/Chest: Effort normal and breath sounds normal. No respiratory distress. She has no wheezes.  Abdominal: Soft. Bowel sounds are normal. She exhibits no distension.  Musculoskeletal: Normal range of motion. She exhibits no edema.  Neurological: She is alert. She exhibits normal muscle tone.  Skin: Skin is warm and dry. She is not diaphoretic. No pallor.  Psychiatric: She has a normal mood and affect. Her behavior is normal. Judgment and thought content normal.    Results for orders placed or performed in visit on 08/26/16  POCT HgB A1C  Result Value Ref Range   Hemoglobin A1C 5.8       Assessment & Plan:   Problem List Items Addressed This Visit      Cardiovascular and Mediastinum   Mitral regurgitation    Noted on echo; reviewed with patient      Relevant Medications   lisinopril-hydrochlorothiazide (ZESTORETIC) 20-12.5 MG tablet   atorvastatin (LIPITOR) 40 MG tablet   Mild aortic stenosis    Noted on echo July 2018; reviewed report with patient      Relevant Medications   lisinopril-hydrochlorothiazide (ZESTORETIC) 20-12.5 MG tablet   atorvastatin (LIPITOR) 40 MG tablet     Other   Hyperglycemia    Check A1c today      Relevant Orders   POCT HgB A1C (Completed)   Altered mental state - Primary    hospitalized; reviewed admission H&P and discharge summary; discussed ddx; keep f/u with neurologist; back to ER if recurs          Follow up plan: Return in about 3 weeks (around 09/16/2016).  An after-visit summary was printed and given to the patient at [place].  Please see the patient instructions which may contain other information and recommendations beyond what is mentioned above in the assessment and plan.  Meds ordered this encounter  Medications  . Multiple Vitamin  (MULTIVITAMIN) tablet    Sig: Take 1 tablet by mouth daily.  Marland Kitchen lisinopril-hydrochlorothiazide (ZESTORETIC) 20-12.5 MG tablet    Sig: Take 1 tablet by mouth daily.    Dispense:  90 tablet    Refill:  3    Changing med, cancel the other refills for 20/25  . atorvastatin (LIPITOR) 40 MG  tablet    Sig: Take 2 tablets (80 mg total) by mouth at bedtime.    Dispense:  180 tablet    Refill:  1    Changing from 80 mg strength to two of the 40 mg -- great idea!!    Orders Placed This Encounter  Procedures  . POCT HgB A1C

## 2016-08-26 NOTE — Assessment & Plan Note (Signed)
Check A1c today.

## 2016-09-08 NOTE — Assessment & Plan Note (Signed)
Noted on echo July 2018; reviewed report with patient

## 2016-09-08 NOTE — Assessment & Plan Note (Signed)
Noted on echo; reviewed with patient

## 2016-09-08 NOTE — Assessment & Plan Note (Signed)
hospitalized; reviewed admission H&P and discharge summary; discussed ddx; keep f/u with neurologist; back to ER if recurs

## 2016-09-16 ENCOUNTER — Ambulatory Visit: Payer: Medicare Other | Admitting: Family Medicine

## 2016-10-17 ENCOUNTER — Ambulatory Visit: Payer: Medicare Other | Admitting: Neurology

## 2016-11-21 DIAGNOSIS — Z23 Encounter for immunization: Secondary | ICD-10-CM | POA: Diagnosis not present

## 2016-12-05 ENCOUNTER — Other Ambulatory Visit: Payer: Self-pay | Admitting: Family Medicine

## 2017-02-16 ENCOUNTER — Other Ambulatory Visit: Payer: Self-pay | Admitting: Family Medicine

## 2017-02-16 NOTE — Telephone Encounter (Signed)
LVM for pt to call and schedule an appt °

## 2017-02-16 NOTE — Telephone Encounter (Signed)
Patient's last visit and labs with me were 6 months ago I'll refill the medicine, but ask her to please schedule an appt to come see me soon We'll get that labs day; she has high TG and low HDL, so I would ask her to fast before her appt please Thank you

## 2017-03-04 ENCOUNTER — Emergency Department: Payer: Medicare Other

## 2017-03-04 ENCOUNTER — Emergency Department
Admission: EM | Admit: 2017-03-04 | Discharge: 2017-03-04 | Disposition: A | Discharge status: Home / Self Care | Payer: Medicare Other | Attending: Emergency Medicine | Admitting: Emergency Medicine

## 2017-03-04 ENCOUNTER — Other Ambulatory Visit: Payer: Self-pay

## 2017-03-04 ENCOUNTER — Encounter: Payer: Self-pay | Admitting: Emergency Medicine

## 2017-03-04 DIAGNOSIS — Z7901 Long term (current) use of anticoagulants: Secondary | ICD-10-CM | POA: Diagnosis not present

## 2017-03-04 DIAGNOSIS — I10 Essential (primary) hypertension: Secondary | ICD-10-CM | POA: Insufficient documentation

## 2017-03-04 DIAGNOSIS — Y929 Unspecified place or not applicable: Secondary | ICD-10-CM | POA: Insufficient documentation

## 2017-03-04 DIAGNOSIS — Y9301 Activity, walking, marching and hiking: Secondary | ICD-10-CM | POA: Insufficient documentation

## 2017-03-04 DIAGNOSIS — Z79899 Other long term (current) drug therapy: Secondary | ICD-10-CM | POA: Diagnosis not present

## 2017-03-04 DIAGNOSIS — W0110XA Fall on same level from slipping, tripping and stumbling with subsequent striking against unspecified object, initial encounter: Secondary | ICD-10-CM | POA: Insufficient documentation

## 2017-03-04 DIAGNOSIS — S52591A Other fractures of lower end of right radius, initial encounter for closed fracture: Secondary | ICD-10-CM | POA: Diagnosis not present

## 2017-03-04 DIAGNOSIS — R51 Headache: Secondary | ICD-10-CM | POA: Diagnosis not present

## 2017-03-04 DIAGNOSIS — S0990XA Unspecified injury of head, initial encounter: Secondary | ICD-10-CM | POA: Diagnosis not present

## 2017-03-04 DIAGNOSIS — W101XXA Fall (on)(from) sidewalk curb, initial encounter: Secondary | ICD-10-CM | POA: Diagnosis not present

## 2017-03-04 DIAGNOSIS — Y999 Unspecified external cause status: Secondary | ICD-10-CM | POA: Insufficient documentation

## 2017-03-04 DIAGNOSIS — S52501A Unspecified fracture of the lower end of right radius, initial encounter for closed fracture: Secondary | ICD-10-CM

## 2017-03-04 DIAGNOSIS — I2510 Atherosclerotic heart disease of native coronary artery without angina pectoris: Secondary | ICD-10-CM | POA: Diagnosis not present

## 2017-03-04 DIAGNOSIS — W19XXXA Unspecified fall, initial encounter: Secondary | ICD-10-CM

## 2017-03-04 DIAGNOSIS — M25561 Pain in right knee: Secondary | ICD-10-CM | POA: Diagnosis not present

## 2017-03-04 DIAGNOSIS — S6991XA Unspecified injury of right wrist, hand and finger(s), initial encounter: Secondary | ICD-10-CM | POA: Diagnosis present

## 2017-03-04 MED ORDER — ONDANSETRON 4 MG PO TBDP: 4.0000 mg | ORAL_TABLET | Freq: Three times a day (TID) | ORAL | 0 refills | Status: DC | PRN

## 2017-03-04 MED ORDER — ONDANSETRON 4 MG PO TBDP
4.0000 mg | ORAL_TABLET | Freq: Once | ORAL | Status: AC
  Administered 2017-03-04: 4 mg via ORAL
  Filled 2017-03-04: qty 1

## 2017-03-04 MED ORDER — TRAMADOL HCL 50 MG PO TABS: 50.0000 mg | ORAL_TABLET | Freq: Four times a day (QID) | ORAL | 0 refills | Status: DC | PRN

## 2017-03-04 MED ORDER — TRAMADOL HCL 50 MG PO TABS
50.0000 mg | ORAL_TABLET | Freq: Once | ORAL | Status: AC
  Administered 2017-03-04: 50 mg via ORAL
  Filled 2017-03-04: qty 1

## 2017-03-04 NOTE — Discharge Instructions (Signed)
Follow-up with Dr. Marry Guan.  Please call for an appointment.  Use the pain medication as needed.  He will need to stay with your sister due to the pain medication.  This may cause falls and we want you to be supervised.  Please elevate and ice the wrist.  Return to emergency department if you have any other problems

## 2017-03-04 NOTE — ED Triage Notes (Signed)
Tripped and fell yesterday on sidewalk, pain R wrist.

## 2017-03-04 NOTE — ED Provider Notes (Signed)
[facility] Emergency Department Provider Note  ____________________________________________   First MD Initiated Contact with Patient 03/04/17 [phone]     (approximate)  I have reviewed the triage vital signs and the nursing notes.   HISTORY  Chief Complaint Wrist Pain    HPI Samantha [name] is a 84 y.o. female states she fell on the sidewalk going to [employer].  She states she fell Thursday.  She told triage yesterday.  Her sister states it was Thursday.  She states her right knee hurts.  She did hit her head but did not lose consciousness.  She states she is on blood thinners.  The sister is concerned due to the blood thinners.  The patient lives alone.  Her sister also has concerns about falls at home because she lives alone.  Past Medical History:  Diagnosis Date  . A-fib ([address]) 06/07/2014  . CAD (coronary artery disease) 06/07/2014  . Depression 09/24/2015  . Dyslipidemia 08/16/2016  . Dysphonia 08/16/2016  . Hypertension   . Mild aortic stenosis 08/22/2016   Echo July 2018  . Mitral regurgitation 08/22/2016   Echo July 2018  . Stroke [facility])     Patient Active Problem List   Diagnosis Date Noted  . Hyperglycemia 08/26/2016  . Mild aortic stenosis 08/22/2016  . Mitral regurgitation 08/22/2016  . Altered mental state 08/18/2016  . Dysphonia 08/16/2016  . Dyslipidemia 08/16/2016  . Forehead trauma, sequela 11/24/2015  . Knee pain, left 11/24/2015  . Rib pain on left side 11/24/2015  . GERD (gastroesophageal reflux disease) 11/24/2015  . Depression 09/24/2015  . Arthralgia of both hands 09/22/2015  . Medication monitoring encounter 09/22/2015  . Headache 09/22/2015  . Fatigue 09/22/2015  . HTN (hypertension) 06/07/2014  . CVA (cerebral infarction) 06/07/2014  . A-fib ([place]) 06/07/2014  . CAD (coronary artery disease) 06/07/2014    Past Surgical History:  Procedure Laterality Date  . ABDOMINAL HYSTERECTOMY    . APPENDECTOMY    .  CHOLECYSTECTOMY    . FRACTURE SURGERY      Prior to Admission medications   Medication Sig Start Date End Date Taking? Authorizing Provider  albuterol (PROVENTIL HFA;VENTOLIN HFA) 108 (90 BASE) MCG/ACT inhaler Inhale 1-2 puffs into the lungs every 6 (six) hours as needed for wheezing or shortness of breath (pneumonia).    [provider]  apixaban (ELIQUIS) 5 MG TABS tablet Take 1 tablet (5 mg total) by mouth 2 (two) times daily. 06/08/14   Bettey Costa, MD  atorvastatin (LIPITOR) 40 MG tablet Take 2 tablets (80 mg total) by mouth at bedtime. 02/16/17   Arnetha Courser, MD  Cholecalciferol (VITAMIN D-1000 MAX ST) 1000 units tablet Take 1,000 Units by mouth daily.    [provider]  FLUoxetine (PROZAC) 20 MG capsule Take 2 capsules (40 mg total) by mouth daily. 12/05/16   Arnetha Courser, MD  isosorbide mononitrate (IMDUR) 60 MG 24 hr tablet Take 60 mg by mouth daily.    [provider]  lisinopril-hydrochlorothiazide (ZESTORETIC) 20-12.5 MG tablet Take 1 tablet by mouth daily. 08/26/16   Arnetha Courser, MD  metoprolol succinate (TOPROL-XL) 100 MG 24 hr tablet Take 1 tablet (100 mg total) by mouth daily. Take with or immediately following a meal. Appointment needed for further refills 08/25/15   Arnetha Courser, MD  Multiple Vitamin (MULTIVITAMIN) tablet Take 1 tablet by mouth daily.    [provider]  ondansetron (ZOFRAN-ODT) 4 MG disintegrating tablet Take 1 tablet (4  mg total) by mouth every 8 (eight) hours as needed for nausea or vomiting. 03/04/17   Fisher, Linden Dolin, PA-C  ranitidine (ZANTAC) 150 MG tablet Take 150 mg by mouth at bedtime.    [provider]  traMADol (ULTRAM) 50 MG tablet Take 1 tablet (50 mg total) by mouth every 6 (six) hours as needed. 03/04/17   Versie Starks, PA-C    Allergies Iodine and Levaquin [levofloxacin]  Family History  Problem Relation Age of Onset  . Stroke Mother   . Heart attack Father   . Heart disease Sister   .  Diabetes Sister   . Heart disease Brother   . Heart attack Brother   . Diabetes Maternal Grandfather   . Ovarian cancer Paternal Grandmother   . Cancer Brother        bladder cancer  . Diabetes Brother   . Heart disease Brother   . Hypertension Brother     Social History Social History   Tobacco Use  . Smoking status: Never Smoker  . Smokeless tobacco: Never Used  Substance Use Topics  . Alcohol use: No  . Drug use: No    Review of Systems  Constitutional: No fever/chills, positive for head injury, no loss of consciousness Eyes: No visual changes. ENT: No sore throat. Respiratory: Denies cough Genitourinary: Negative for dysuria. Musculoskeletal: Negative for back pain.  Positive for right wrist pain, right knee pain Skin: Negative for rash.  No abrasions    ____________________________________________   PHYSICAL EXAM:  VITAL SIGNS: ED Triage Vitals  Enc Vitals Group     BP 03/04/17 0934 115/77     Pulse Rate 03/04/17 0934 80     Resp 03/04/17 0934 20     Temp 03/04/17 0934 97.8 F (36.6 C)     Temp Source 03/04/17 0934 Oral     SpO2 03/04/17 0934 97 %     Weight 03/04/17 0936 139 lb (63 kg)     Height 03/04/17 0936 5' (1.524 m)     Head Circumference --      Peak Flow --      Pain Score 03/04/17 0935 9     Pain Loc --      Pain Edu? --      Excl. in [place]? --     Constitutional: Alert and oriented. Well appearing and in no acute distress.  Is able to answer all questions in an appropriate manner Eyes: Conjunctivae are normal.  Head: Atraumatic. Nose: No congestion/rhinnorhea. Mouth/Throat: Mucous membranes are moist.   Cardiovascular: Normal rate, regular rhythm.  Heart sounds are normal Respiratory: Normal respiratory effort.  No retractions, lungs clear to auscultation GU: deferred Musculoskeletal: The right wrist has a deformity with swelling at the distal radius and distal ulna.  The patient has pain with movement.  The elbow is not tender in the  shoulder is not tender.  The right knee is tender and has an abrasion.  She has full range of motion of the leg.  Pain is reproduced with weightbearing Neurologic:  Normal speech and language.  Skin:  Skin is warm, dry and intact. No rash noted. Psychiatric: Mood and affect are normal. Speech and behavior are normal for patient.  She has a speech deficit due to a previous stroke  ____________________________________________   LABS (all labs ordered are listed, but only abnormal results are displayed)  Labs Reviewed - No data to display ____________________________________________   ____________________________________________  RADIOLOGY  CT of the head is  negative for acute abnormality X-ray of the right knee is negative for fracture X-ray of the right wrist is positive for a distal radius and distal styloid fracture.  The radial aspect is comminuted  ____________________________________________   PROCEDURES  Procedure(s) performed:   .Splint Application Date/Time: 05/07/9627 11:15 AM Performed by: Suszanne Conners, NT Authorized by: Versie Starks, PA-C   Consent:    Consent obtained:  Verbal   Consent given by:  Patient   Risks discussed:  Discoloration, numbness, pain and swelling   Alternatives discussed:  No treatment Pre-procedure details:    Sensation:  Normal Procedure details:    Laterality:  Right   Location:  Wrist   Wrist:  R wrist   Strapping: no     Cast type:  Short arm   Splint type:  Volar short arm   Supplies:  Ortho-Glass Post-procedure details:    Pain:  Unchanged   Sensation:  Normal   Patient tolerance of procedure:  Tolerated well, no immediate complications Comments:     I personally checked the splint.  Patient is neurovascularly intact post splint application      ____________________________________________   INITIAL IMPRESSION / ASSESSMENT AND PLAN / ED COURSE  Pertinent labs & imaging results that were available during my  care of the patient were reviewed by me and considered in my medical decision making (see chart for details).  Patient is an 84 year old female who fell on sidewalk a few days ago.  She she is a fairly good historian.  She has had a previous stroke.  Her sister is concerned about the fall due to the use of blood thinners.  The patient denies a headache.  She states all the pain is in the right wrist.  She also complains of right knee pain.  On physical exam patient appears well.  She is able to answer all questions in an appropriate manner.  The right wrist is swollen and tender with a deformity.  Right knee is tender with an abrasion.  She is neurovascularly intact  CT of the head is negative for acute abnormality X-ray of the right knee is negative for acute fracture X-ray of the right wrist is positive for a distal radius and ulnar styloid fracture.  Patient was placed in a volar OCL, sling was provided.  They were instructed to apply ice to the decrease the swelling.  They are to follow-up with orthopedics.  The patient is requesting Dr. Clydell Hakim phone number was provided.  They are to call and make an appointment.  The patient was given Zofran, and tramadol 50 mg p.o. while in the ED.  She was given a prescription for Zofran ODT 4 mg #10 with no refill, tramadol 50 mg every 6 hours #10 no refill.  Had a long discussion with the family about chance of falls and my concerns of her being on a pain medication due to her age.  Falls could be more complicated.  Her sister states that she will be staying with her.  Patient is not happy about this but the sister is insistent that she will stay at her house.  Both parties agreed to follow the treatment plan.  Patient was discharged in stable condition.     As part of my medical decision making, I reviewed the following data within the [place] History obtained from family, Nursing notes reviewed and incorporated, Old chart reviewed,  Radiograph reviewed x-ray of the right wrist positive for fracture.  CT of the head is negative, and x-ray of the right knee is negative, Notes from prior ED visits and [place] Controlled Substance Database  ____________________________________________   FINAL CLINICAL IMPRESSION(S) / ED DIAGNOSES  Final diagnoses:  Closed fracture of distal end of right radius, unspecified fracture morphology, initial encounter  Fall, initial encounter      NEW MEDICATIONS STARTED DURING THIS VISIT:  New Prescriptions   ONDANSETRON (ZOFRAN-ODT) 4 MG DISINTEGRATING TABLET    Take 1 tablet (4 mg total) by mouth every 8 (eight) hours as needed for nausea or vomiting.   TRAMADOL (ULTRAM) 50 MG TABLET    Take 1 tablet (50 mg total) by mouth every 6 (six) hours as needed.     Note:  This document was prepared using Dragon voice recognition software and may include unintentional dictation errors.    Versie Starks, PA-C 03/04/17 1123    Schuyler Amor, MD 03/04/17 [phone]

## 2017-03-09 DIAGNOSIS — S52614A Nondisplaced fracture of right ulna styloid process, initial encounter for closed fracture: Secondary | ICD-10-CM | POA: Diagnosis not present

## 2017-03-10 DIAGNOSIS — S42309A Unspecified fracture of shaft of humerus, unspecified arm, initial encounter for closed fracture: Secondary | ICD-10-CM

## 2017-03-10 HISTORY — DX: Unspecified fracture of shaft of humerus, unspecified arm, initial encounter for closed fracture: S42.309A

## 2017-03-13 ENCOUNTER — Telehealth: Payer: Self-pay

## 2017-03-13 NOTE — Telephone Encounter (Signed)
Pt really wants to see you and has scheduled an appt for tomorrow   Copied from [place]. Topic: Inquiry >> Mar 13, 2017 10:50 AM Oliver Pila B wrote: Reason for CRM: pt called b/c she is looking for home health aid to come to her home, pt was released from the hospital b/c pt fell and broker her right arm, pt does not have transportation to get to pcp, try to get pt in for hos f/u, contact pt to advise

## 2017-03-13 NOTE — Telephone Encounter (Signed)
We are so sorry to hear about her fall and her fracture I do actually need to see her to order home health; that's a Medicare rule Please schedule an appointment as soon as possible If she has an agency in mind, we can put in the orders and then see her within 30 days of the order; if she is not seen within that time, her insurance won't pay for her therapy If she is seeing an orthopaedist, then THAT doctor can actually write for home orders if she has seen him/her for this problem; that would be the best option if she can't get in [facility] can certainly be a resource too if she needs assisted living or other needs

## 2017-03-14 ENCOUNTER — Ambulatory Visit: Payer: Medicare Other | Admitting: Family Medicine

## 2017-03-19 NOTE — Telephone Encounter (Signed)
Found open note Patient has an appt on 03/21/17 Closing note

## 2017-03-21 ENCOUNTER — Ambulatory Visit (INDEPENDENT_AMBULATORY_CARE_PROVIDER_SITE_OTHER): Payer: Medicare Other | Admitting: Family Medicine

## 2017-03-21 ENCOUNTER — Encounter: Payer: Self-pay | Admitting: Family Medicine

## 2017-03-21 VITALS — BP 112/64 | HR 77 | Temp 97.8°F | Resp 14 | Wt 146.7 lb

## 2017-03-21 DIAGNOSIS — M8000XD Age-related osteoporosis with current pathological fracture, unspecified site, subsequent encounter for fracture with routine healing: Secondary | ICD-10-CM | POA: Diagnosis not present

## 2017-03-21 DIAGNOSIS — G629 Polyneuropathy, unspecified: Secondary | ICD-10-CM | POA: Diagnosis not present

## 2017-03-21 DIAGNOSIS — W19XXXD Unspecified fall, subsequent encounter: Secondary | ICD-10-CM | POA: Diagnosis not present

## 2017-03-21 DIAGNOSIS — S5291XA Unspecified fracture of right forearm, initial encounter for closed fracture: Secondary | ICD-10-CM

## 2017-03-21 DIAGNOSIS — Z780 Asymptomatic menopausal state: Secondary | ICD-10-CM

## 2017-03-21 DIAGNOSIS — S52591D Other fractures of lower end of right radius, subsequent encounter for closed fracture with routine healing: Secondary | ICD-10-CM | POA: Diagnosis not present

## 2017-03-21 DIAGNOSIS — R2689 Other abnormalities of gait and mobility: Secondary | ICD-10-CM

## 2017-03-21 DIAGNOSIS — M810 Age-related osteoporosis without current pathological fracture: Secondary | ICD-10-CM | POA: Insufficient documentation

## 2017-03-21 HISTORY — DX: Unspecified fracture of right forearm, initial encounter for closed fracture: S52.91XA

## 2017-03-21 NOTE — Assessment & Plan Note (Signed)
Order DEXA; fall precautions; calcium intake, vit D

## 2017-03-21 NOTE — Assessment & Plan Note (Signed)
Dominant hand; significantly limits her ability to do ADLs an IADLs; 3 hours a day 5 days a week would be adequate

## 2017-03-21 NOTE — Progress Notes (Signed)
BP 112/64   Pulse 77   Temp 97.8 F (36.6 C) (Oral)   Resp 14   Wt 146 lb 11.2 oz (66.5 kg)   SpO2 95%   BMI 28.65 kg/m    Subjective:    Patient ID: Samantha [name], female    DOB: 1928-11-11, 84 y.o.   MRN: [id number]  HPI: Samantha [name] is a 84 y.o. female  Chief Complaint  Patient presents with  . Follow-up  . Arm Injury    right, needs home health     HPI Patient is here for follow-up after suffering a right wrist fracture She is here for her face-to-face visit for home health care  She fell and broke her right wrist on Mar 04, 2017 She is under the care of [facility] She is taking tylenol and tramadol for pain She loves to color in adult coloring books, but her right hand would get numb and then she would use a band like for carpal tunnel  She cannot wash her hair, bathing, cooking, opening bottles of medicines, opening anything Patient was staying with sister for the first week but patient wanted to go back to her own home She is more comfortable in her own home Would like a home health aide Moving fingers now, but no formal therapy  She had walked to [employer] and was coming down the sidewalk, then her left toe hit and she went forward; mechanical fall; I asked if she wants PT and she says she does her own exercises  There was a lady named Millie with an agency and was the most wonderful person;  She has had bone density done a few times; aware of thin bones  Balanced diet; not getting up on rickety chairs; no stairs at home; getting calcium intake  RIGHT wrist xray 03/04/2017 CLINICAL DATA:  Pain after fall  EXAM: RIGHT WRIST - COMPLETE 3+ VIEW  COMPARISON:  None.  FINDINGS: There is a fracture of the distal ulnar styloid. There is a comminuted mildly displaced fracture of the distal radius as well extending into the radiocarpal joint. No other fractures. No dislocation.  IMPRESSION: Comminuted mildly displaced fracture of the distal  radius extending into the radiocarpal joint. Ulnar styloid fracture.   Electronically Signed   By: Dorise Bullion III M.D   On: 03/04/2017 10:02  Head CT 03/04/2017 IMPRESSION: 1. No acute intracranial abnormalities. 2. Chronic opacification of left mastoid air cells. Increasing opacification of the external auditory canal on the left.   Electronically Signed   By: Dorise Bullion III M.D   On: 03/04/2017 10:35  RIGHT knee xray 03/04/2017: CLINICAL DATA:  Status post fall days ago.  EXAM: RIGHT KNEE - COMPLETE 4+ VIEW  COMPARISON:  None.  FINDINGS: Generalized osteopenia. No acute fracture or dislocation. No aggressive osseous lesion. No significant joint effusion. Mild soft tissue swelling superficial to the patella.  IMPRESSION: No acute osseous injury of the right knee.   Electronically Signed   By: Kathreen Devoid   On: 03/04/2017 10:46   Depression screen [facility] 2/9 03/21/2017 08/26/2016 08/16/2016 11/24/2015 10/20/2015  Decreased Interest 0 0 0 0 1  Down, Depressed, Hopeless 1 1 1 3 1   PHQ - 2 Score 1 1 1 3 2   Altered sleeping - - - 3 1  Tired, decreased energy - - - 3 2  Change in appetite - - - 0 0  Feeling bad or failure about yourself  - - - 3  1  Trouble concentrating - - - 3 0  Moving slowly or fidgety/restless - - - 3 1  Suicidal thoughts - - - 3 0  PHQ-9 Score - - - 21 7  Difficult doing work/chores - - - Extremely dIfficult Very difficult    Relevant past medical, surgical, family and social history reviewed Past Medical History:  Diagnosis Date  . A-fib ([place]) 06/07/2014  . Arm fracture 03/10/2017   right  . CAD (coronary artery disease) 06/07/2014  . Depression 09/24/2015  . Dyslipidemia 08/16/2016  . Dysphonia 08/16/2016  . Hypertension   . Mild aortic stenosis 08/22/2016   Echo July 2018  . Mitral regurgitation 08/22/2016   Echo July 2018  . Stroke [facility])    Past Surgical History:  Procedure Laterality Date  . ABDOMINAL HYSTERECTOMY    .  APPENDECTOMY    . CHOLECYSTECTOMY    . FRACTURE SURGERY     Family History  Problem Relation Age of Onset  . Stroke Mother   . Heart attack Father   . Heart disease Sister   . Diabetes Sister   . Heart disease Brother   . Heart attack Brother   . Diabetes Maternal Grandfather   . Ovarian cancer Paternal Grandmother   . Cancer Brother        bladder cancer  . Diabetes Brother   . Heart disease Brother   . Hypertension Brother    Social History   Tobacco Use  . Smoking status: Never Smoker  . Smokeless tobacco: Never Used  Substance Use Topics  . Alcohol use: No  . Drug use: No    Interim medical history since last visit reviewed. Allergies and medications reviewed  Review of Systems Per HPI unless specifically indicated above     Objective:    BP 112/64   Pulse 77   Temp 97.8 F (36.6 C) (Oral)   Resp 14   Wt 146 lb 11.2 oz (66.5 kg)   SpO2 95%   BMI 28.65 kg/m   Wt Readings from Last 3 Encounters:  03/21/17 146 lb 11.2 oz (66.5 kg)  03/04/17 139 lb (63 kg)  08/26/16 145 lb 11.2 oz (66.1 kg)    Physical Exam  Constitutional: She appears well-developed and well-nourished. No distress.  HENT:  Mouth/Throat: Mucous membranes are normal.  Eyes: EOM are normal. No scleral icterus.  Cardiovascular: Normal rate and regular rhythm.  Pulmonary/Chest: Effort normal and breath sounds normal.  Neurological: She is alert.  Loss of sensation to monofilament testing on all fingers of the RIGHT hand; patient can wiggle the fingers; wrist flexion not possible because of cast  Psychiatric: She has a normal mood and affect. Her behavior is normal. Her mood appears not anxious. She does not exhibit a depressed mood.   Results for orders placed or performed in visit on 08/26/16  POCT HgB A1C  Result Value Ref Range   Hemoglobin A1C 5.8       Assessment & Plan:   Problem List Items Addressed This Visit      Musculoskeletal and Integument   Osteoporosis    Order  DEXA; fall precautions; calcium intake, vit D      Relevant Orders   DG Bone Density   Ambulatory referral to [place] right radial fracture - Primary    Dominant hand; significantly limits her ability to do ADLs an IADLs; 3 hours a day 5 days a week would be adequate  Relevant Orders   DG Bone Density   Ambulatory referral to [place]    Other Visit Diagnoses    Postmenopausal       Relevant Orders   DG Bone Density   Balance problem       Relevant Orders   Ambulatory referral to [place], subsequent encounter       Relevant Orders   Ambulatory referral to [place]   Neuropathy       advised patient that this is NOT normal and to go immediately from here to see her orthopaedist       Follow up plan: No Follow-up on file.  An after-visit summary was printed and given to the patient at [facility].  Please see the patient instructions which may contain other information and recommendations beyond what is mentioned above in the assessment and plan.  No orders of the defined types were placed in this encounter.   Orders Placed This Encounter  Procedures  . DG Bone Density  . Ambulatory referral to [place]

## 2017-03-21 NOTE — Patient Instructions (Addendum)
If you are not able to feel your fingers, go from my office right over to [facility] and let them know so they can work you in or do more xrays Please ask your orthopaedist about medicine to help prevent future fractures Try giving up dairy for two weeks to see if that helps your mucous Please do call to schedule your bone density study; the number to schedule one at either [facility] or [facility] is [phone] or [phone] Call me with any changes   Fall Prevention in the Home Falls can cause injuries. They can happen to people of all ages. There are many things you can do to make your home safe and to help prevent falls. What can I do on the outside of my home?  Regularly fix the edges of walkways and driveways and fix any cracks.  Remove anything that might make you trip as you walk through a door, such as a raised step or threshold.  Trim any bushes or trees on the path to your home.  Use bright outdoor lighting.  Clear any walking paths of anything that might make someone trip, such as rocks or tools.  Regularly check to see if handrails are loose or broken. Make sure that both [place] of any steps have handrails.  Any raised decks and porches should have guardrails on the edges.  Have any leaves, snow, or ice cleared regularly.  Use sand or salt on walking paths during winter.  Clean up any spills in your garage right away. This includes oil or grease spills. What can I do in the bathroom?  Use night lights.  Install grab bars by the toilet and in the tub and shower. Do not use towel bars as grab bars.  Use non-skid mats or decals in the tub or shower.  If you need to sit down in the shower, use a plastic, non-slip stool.  Keep the floor dry. Clean up any water that spills on the floor as soon as it happens.  Remove soap buildup in the tub or shower regularly.  Attach bath mats securely with double-sided non-slip rug  tape.  Do not have throw rugs and other things on the floor that can make you trip. What can I do in the bedroom?  Use night lights.  Make sure that you have a light by your bed that is easy to reach.  Do not use any sheets or blankets that are too big for your bed. They should not hang down onto the floor.  Have a firm chair that has side arms. You can use this for support while you get dressed.  Do not have throw rugs and other things on the floor that can make you trip. What can I do in the kitchen?  Clean up any spills right away.  Avoid walking on wet floors.  Keep items that you use a lot in easy-to-reach places.  If you need to reach something above you, use a strong step stool that has a grab bar.  Keep electrical cords out of the way.  Do not use floor polish or wax that makes floors slippery. If you must use wax, use non-skid floor wax.  Do not have throw rugs and other things on the floor that can make you trip. What can I do with my stairs?  Do not leave any items on the stairs.  Make sure that there are handrails on both [place] of the stairs and  use them. Fix handrails that are broken or loose. Make sure that handrails are as long as the stairways.  Check any carpeting to make sure that it is firmly attached to the stairs. Fix any carpet that is loose or worn.  Avoid having throw rugs at the top or bottom of the stairs. If you do have throw rugs, attach them to the floor with carpet tape.  Make sure that you have a light switch at the top of the stairs and the bottom of the stairs. If you do not have them, ask someone to add them for you. What else can I do to help prevent falls?  Wear shoes that: ? Do not have high heels. ? Have rubber bottoms. ? Are comfortable and fit you well. ? Are closed at the toe. Do not wear sandals.  If you use a stepladder: ? Make sure that it is fully opened. Do not climb a closed stepladder. ? Make sure that both [place] of the  stepladder are locked into place. ? Ask someone to hold it for you, if possible.  Clearly mark and make sure that you can see: ? Any grab bars or handrails. ? First and last steps. ? Where the edge of each step is.  Use tools that help you move around (mobility aids) if they are needed. These include: ? Canes. ? Walkers. ? Scooters. ? Crutches.  Turn on the lights when you go into a dark area. Replace any light bulbs as soon as they burn out.  Set up your furniture so you have a clear path. Avoid moving your furniture around.  If any of your floors are uneven, fix them.  If there are any pets around you, be aware of where they are.  Review your medicines with your doctor. Some medicines can make you feel dizzy. This can increase your chance of falling. Ask your doctor what other things that you can do to help prevent falls. This information is not intended to replace advice given to you by your health care provider. Make sure you discuss any questions you have with your health care provider. Document Released: 11/13/2008 Document Revised: 06/25/2015 Document Reviewed: 02/21/2014 Elsevier Interactive Patient Education  2018 [employer].  Osteoporosis Osteoporosis is the thinning and loss of density in the bones. Osteoporosis makes the bones more brittle, fragile, and likely to break (fracture). Over time, osteoporosis can cause the bones to become so weak that they fracture after a simple fall. The bones most likely to fracture are the bones in the hip, wrist, and spine. What are the causes? The exact cause is not known. What increases the risk? Anyone can develop osteoporosis. You may be at greater risk if you have a family history of the condition or have poor nutrition. You may also have a higher risk if you are:  Female.  82 years old or older.  A smoker.  Not physically active.  White or Asian.  Slender.  What are the signs or symptoms? A fracture might be the  first sign of the disease, especially if it results from a fall or injury that would not usually cause a bone to break. Other signs and symptoms include:  Low back and neck pain.  Stooped posture.  Height loss.  How is this diagnosed? To make a diagnosis, your health care provider may:  Take a medical history.  Perform a physical exam.  Order tests, such as: ? A bone mineral density test. ? A dual-energy  X-ray absorptiometry test.  How is this treated? The goal of osteoporosis treatment is to strengthen your bones to reduce your risk of a fracture. Treatment may involve:  Making lifestyle changes, such as: ? Eating a diet rich in calcium. ? Doing weight-bearing and muscle-strengthening exercises. ? Stopping tobacco use. ? Limiting alcohol intake.  Taking medicine to slow the process of bone loss or to increase bone density.  Monitoring your levels of calcium and vitamin D.  Follow these instructions at home:  Include calcium and vitamin D in your diet. Calcium is important for bone health, and vitamin D helps the body absorb calcium.  Perform weight-bearing and muscle-strengthening exercises as directed by your health care provider.  Do not use any tobacco products, including cigarettes, chewing tobacco, and electronic cigarettes. If you need help quitting, ask your health care provider.  Limit your alcohol intake.  Take medicines only as directed by your health care provider.  Keep all follow-up visits as directed by your health care provider. This is important.  Take precautions at home to lower your risk of falling, such as: ? Keeping rooms well lit and clutter free. ? Installing safety rails on stairs. ? Using rubber mats in the bathroom and other areas that are often wet or slippery. Get help right away if: You fall or injure yourself. This information is not intended to replace advice given to you by your health care provider. Make sure you discuss any  questions you have with your health care provider. Document Released: 10/27/2004 Document Revised: 06/22/2015 Document Reviewed: 06/27/2013 Elsevier Interactive Patient Education  [employer].

## 2017-03-28 DIAGNOSIS — S52501D Unspecified fracture of the lower end of right radius, subsequent encounter for closed fracture with routine healing: Secondary | ICD-10-CM | POA: Diagnosis not present

## 2017-03-28 DIAGNOSIS — S52614D Nondisplaced fracture of right ulna styloid process, subsequent encounter for closed fracture with routine healing: Secondary | ICD-10-CM | POA: Diagnosis not present

## 2017-03-29 ENCOUNTER — Telehealth: Payer: Self-pay | Admitting: Family Medicine

## 2017-03-29 NOTE — Telephone Encounter (Signed)
Community message received: FW: Home Heath/Unable to reach pnt  Received: Today  Message Contents  [place], [place], [place]  Arnetha Courser, MD  Phone Number: [phone]      Previous Messages    ----- Message -----  From: Jana Half  Sent: 03/29/2017 11:20 AM  To: Dennard Schaumann, CMA  Subject: Home Heath/Unable to reach pnt          Good morning! I have not been able to reach [name] since last week. She asked [place] to wait until her followup appt with [facility] which was yesterday. She does not need surgery and they have removed her cast. Can you reach out to her to see if she is still interested in home health services? She may not know that her Medicare covers these services at 100%, and that's why she is not answering my calls. Just let me know how to proceed.   Thanks,  [facility] staff - please reach out to patient as requested Thank you, Dr. Sanda Klein

## 2017-03-30 NOTE — Telephone Encounter (Signed)
Patient has been scheduled to be seen on 03/31/2017.

## 2017-04-12 ENCOUNTER — Telehealth: Payer: Self-pay | Admitting: Family Medicine

## 2017-04-12 DIAGNOSIS — I10 Essential (primary) hypertension: Secondary | ICD-10-CM | POA: Diagnosis not present

## 2017-04-12 DIAGNOSIS — I350 Nonrheumatic aortic (valve) stenosis: Secondary | ICD-10-CM | POA: Diagnosis not present

## 2017-04-12 DIAGNOSIS — M80021D Age-related osteoporosis with current pathological fracture, right humerus, subsequent encounter for fracture with routine healing: Secondary | ICD-10-CM | POA: Diagnosis not present

## 2017-04-12 DIAGNOSIS — Z7901 Long term (current) use of anticoagulants: Secondary | ICD-10-CM | POA: Diagnosis not present

## 2017-04-12 DIAGNOSIS — I4891 Unspecified atrial fibrillation: Secondary | ICD-10-CM | POA: Diagnosis not present

## 2017-04-12 DIAGNOSIS — Z8673 Personal history of transient ischemic attack (TIA), and cerebral infarction without residual deficits: Secondary | ICD-10-CM | POA: Diagnosis not present

## 2017-04-12 DIAGNOSIS — I2510 Atherosclerotic heart disease of native coronary artery without angina pectoris: Secondary | ICD-10-CM | POA: Diagnosis not present

## 2017-04-12 DIAGNOSIS — G629 Polyneuropathy, unspecified: Secondary | ICD-10-CM | POA: Diagnosis not present

## 2017-04-12 DIAGNOSIS — Z9181 History of falling: Secondary | ICD-10-CM | POA: Diagnosis not present

## 2017-04-12 DIAGNOSIS — F329 Major depressive disorder, single episode, unspecified: Secondary | ICD-10-CM | POA: Diagnosis not present

## 2017-04-12 DIAGNOSIS — K219 Gastro-esophageal reflux disease without esophagitis: Secondary | ICD-10-CM | POA: Diagnosis not present

## 2017-04-12 DIAGNOSIS — E785 Hyperlipidemia, unspecified: Secondary | ICD-10-CM | POA: Diagnosis not present

## 2017-04-12 NOTE — Telephone Encounter (Signed)
Copied from [place] [phone]. Topic: Quick Communication - See Telephone Encounter >> Apr 12, 2017  2:05 PM Arletha Grippe wrote: CRM for notification. See Telephone encounter for:   04/12/17. [facility] Paganini [facility] Verbal orders:  Nursing 1 week 3  OT and PT , social work - eval and treat  Refused home health aid Cb [phone] ok to leave message

## 2017-04-12 NOTE — Telephone Encounter (Signed)
That is fine 

## 2017-04-14 DIAGNOSIS — M80021D Age-related osteoporosis with current pathological fracture, right humerus, subsequent encounter for fracture with routine healing: Secondary | ICD-10-CM | POA: Diagnosis not present

## 2017-04-14 DIAGNOSIS — I10 Essential (primary) hypertension: Secondary | ICD-10-CM | POA: Diagnosis not present

## 2017-04-14 DIAGNOSIS — G629 Polyneuropathy, unspecified: Secondary | ICD-10-CM | POA: Diagnosis not present

## 2017-04-14 DIAGNOSIS — I4891 Unspecified atrial fibrillation: Secondary | ICD-10-CM | POA: Diagnosis not present

## 2017-04-14 DIAGNOSIS — K219 Gastro-esophageal reflux disease without esophagitis: Secondary | ICD-10-CM | POA: Diagnosis not present

## 2017-04-14 DIAGNOSIS — I2510 Atherosclerotic heart disease of native coronary artery without angina pectoris: Secondary | ICD-10-CM | POA: Diagnosis not present

## 2017-04-18 DIAGNOSIS — S52601A Unspecified fracture of lower end of right ulna, initial encounter for closed fracture: Secondary | ICD-10-CM | POA: Diagnosis not present

## 2017-04-24 DIAGNOSIS — G629 Polyneuropathy, unspecified: Secondary | ICD-10-CM | POA: Diagnosis not present

## 2017-04-24 DIAGNOSIS — I10 Essential (primary) hypertension: Secondary | ICD-10-CM | POA: Diagnosis not present

## 2017-04-24 DIAGNOSIS — I4891 Unspecified atrial fibrillation: Secondary | ICD-10-CM | POA: Diagnosis not present

## 2017-04-24 DIAGNOSIS — K219 Gastro-esophageal reflux disease without esophagitis: Secondary | ICD-10-CM | POA: Diagnosis not present

## 2017-04-24 DIAGNOSIS — I2510 Atherosclerotic heart disease of native coronary artery without angina pectoris: Secondary | ICD-10-CM | POA: Diagnosis not present

## 2017-04-24 DIAGNOSIS — M80021D Age-related osteoporosis with current pathological fracture, right humerus, subsequent encounter for fracture with routine healing: Secondary | ICD-10-CM | POA: Diagnosis not present

## 2017-04-27 ENCOUNTER — Telehealth: Payer: Self-pay | Admitting: Family Medicine

## 2017-04-27 NOTE — Telephone Encounter (Signed)
Copied from [place]. Topic: General - Other >> Apr 27, 2017  9:22 AM Yvette Rack wrote: Reason for CRM: April Nurse  from [facility] [phone] calling stating that pt has requested to cancelled the next several services stating that her daughter has passed and didn't want anymore services from them April stated that pt had missed a few visits and that they will close out order pt had left a voicemail was very upset wanted to stop all the calls and services  April states that she feel like the pt need a well check visit to see how she is doing but she dont think that it should be them but someone else

## 2017-04-27 NOTE — Telephone Encounter (Signed)
Called pt no answer, LM for pt informing her of our condolences, also offered to help in any way that we can, asked pt to call back and schedule an appointment. Just wanted you to be informed.

## 2017-04-27 NOTE — Telephone Encounter (Signed)
Thank you :)

## 2017-05-22 ENCOUNTER — Other Ambulatory Visit: Payer: Self-pay | Admitting: Family Medicine

## 2017-08-24 ENCOUNTER — Other Ambulatory Visit: Payer: Self-pay | Admitting: Family Medicine

## 2017-08-24 NOTE — Telephone Encounter (Signed)
Please ask patient to schedule an appointment in the next few weeks She needs labs She is way overdue for her bone density test; please let her know how to schedule that

## 2017-08-25 ENCOUNTER — Other Ambulatory Visit: Payer: Self-pay | Admitting: Family Medicine

## 2017-08-25 NOTE — Telephone Encounter (Signed)
Please ask patient how she is taking her atorvastatin She should have run out of her cholesterol medicine in April, 3 months ago

## 2017-08-25 NOTE — Telephone Encounter (Signed)
Left detailed voicemail

## 2017-08-28 NOTE — Telephone Encounter (Signed)
She needs an appointment and labs please; I sent in one month supply of the statin as she claims to be taking it

## 2017-08-28 NOTE — Telephone Encounter (Signed)
Pt states has been taking 2 tablets at bedtime and has been taking as prescribed.  But could not hear what I was saying as to who has been prescribing?

## 2017-08-29 NOTE — Telephone Encounter (Signed)
Pt.notified, and appt scheduled

## 2017-08-31 ENCOUNTER — Telehealth: Payer: Self-pay

## 2017-08-31 NOTE — Telephone Encounter (Signed)
Called pt to sched AWV w/ [facility]. LVM requesting returned call.  

## 2017-09-11 ENCOUNTER — Encounter: Payer: Self-pay | Admitting: Family Medicine

## 2017-09-11 ENCOUNTER — Ambulatory Visit (INDEPENDENT_AMBULATORY_CARE_PROVIDER_SITE_OTHER): Payer: Medicare Other | Admitting: Family Medicine

## 2017-09-11 VITALS — BP 132/64 | HR 90 | Temp 97.8°F | Resp 12 | Ht 60.0 in | Wt 143.6 lb

## 2017-09-11 DIAGNOSIS — I10 Essential (primary) hypertension: Secondary | ICD-10-CM

## 2017-09-11 DIAGNOSIS — R739 Hyperglycemia, unspecified: Secondary | ICD-10-CM | POA: Diagnosis not present

## 2017-09-11 DIAGNOSIS — M8000XD Age-related osteoporosis with current pathological fracture, unspecified site, subsequent encounter for fracture with routine healing: Secondary | ICD-10-CM

## 2017-09-11 DIAGNOSIS — I482 Chronic atrial fibrillation, unspecified: Secondary | ICD-10-CM

## 2017-09-11 DIAGNOSIS — M7918 Myalgia, other site: Secondary | ICD-10-CM | POA: Diagnosis not present

## 2017-09-11 DIAGNOSIS — Z5181 Encounter for therapeutic drug level monitoring: Secondary | ICD-10-CM | POA: Diagnosis not present

## 2017-09-11 DIAGNOSIS — E785 Hyperlipidemia, unspecified: Secondary | ICD-10-CM

## 2017-09-11 DIAGNOSIS — R5383 Other fatigue: Secondary | ICD-10-CM

## 2017-09-11 DIAGNOSIS — R269 Unspecified abnormalities of gait and mobility: Secondary | ICD-10-CM | POA: Diagnosis not present

## 2017-09-11 NOTE — Patient Instructions (Addendum)
Sciatica Sciatica is pain, numbness, weakness, or tingling along the path of the sciatic nerve. The sciatic nerve starts in the lower back and runs down the back of each leg. The nerve controls the muscles in the lower leg and in the back of the knee. It also provides feeling (sensation) to the back of the thigh, the lower leg, and the sole of the foot. Sciatica is a symptom of another medical condition that pinches or puts pressure on the sciatic nerve. Generally, sciatica only affects one side of the body. Sciatica usually goes away on its own or with treatment. In some cases, sciatica may keep coming back (recur). What are the causes? This condition is caused by pressure on the sciatic nerve, or pinching of the sciatic nerve. This may be the result of:  A disk in between the bones of the spine (vertebrae) bulging out too far (herniated disk).  Age-related changes in the spinal disks (degenerative disk disease).  A pain disorder that affects a muscle in the buttock (piriformis syndrome).  Extra bone growth (bone spur) near the sciatic nerve.  An injury or break (fracture) of the pelvis.  Pregnancy.  Tumor (rare).  What increases the risk? The following factors may make you more likely to develop this condition:  Playing sports that place pressure or stress on the spine, such as football or weight lifting.  Having poor strength and flexibility.  A history of back injury.  A history of back surgery.  Sitting for long periods of time.  Doing activities that involve repetitive bending or lifting.  Obesity.  What are the signs or symptoms? Symptoms can vary from mild to very severe, and they may include:  Any of these problems in the lower back, leg, hip, or buttock: ? Mild tingling or dull aches. ? Burning sensations. ? Sharp pains.  Numbness in the back of the calf or the sole of the foot.  Leg weakness.  Severe back pain that makes movement difficult.  These  symptoms may get worse when you cough, sneeze, or laugh, or when you sit or stand for long periods of time. Being overweight may also make symptoms worse. In some cases, symptoms may recur over time. How is this diagnosed? This condition may be diagnosed based on:  Your symptoms.  A physical exam. Your health care provider may ask you to do certain movements to check whether those movements trigger your symptoms.  You may have tests, including: ? Blood tests. ? X-rays. ? MRI. ? CT scan.  How is this treated? In many cases, this condition improves on its own, without any treatment. However, treatment may include:  Reducing or modifying physical activity during periods of pain.  Exercising and stretching to strengthen your abdomen and improve the flexibility of your spine.  Icing and applying heat to the affected area.  Medicines that help: ? To relieve pain and swelling. ? To relax your muscles.  Injections of medicines that help to relieve pain, irritation, and inflammation around the sciatic nerve (steroids).  Surgery.  Follow these instructions at home: Medicines  Take over-the-counter and prescription medicines only as told by your health care provider.  Do not drive or operate heavy machinery while taking prescription pain medicine. Managing pain  If directed, apply ice to the affected area. ? Put ice in a plastic bag. ? Place a towel between your skin and the bag. ? Leave the ice on for 20 minutes, 2-3 times a day.  After icing, apply heat to the affected area before you exercise or as often as told by your health care provider. Use the heat source that your health care provider recommends, such as a moist heat pack or a heating pad. ? Place a towel between your skin and the heat source. ? Leave the heat on for 20-30 minutes. ? Remove the heat if your skin turns bright red. This is especially important if you are unable to feel pain, heat, or cold. You may have a  greater risk of getting burned. Activity  Return to your normal activities as told by your health care provider. Ask your health care provider what activities are safe for you. ? Avoid activities that make your symptoms worse.  Take brief periods of rest throughout the day. Resting in a lying or standing position is usually better than sitting to rest. ? When you rest for longer periods, mix in some mild activity or stretching between periods of rest. This will help to prevent stiffness and pain. ? Avoid sitting for long periods of time without moving. Get up and move around at least one time each hour.  Exercise and stretch regularly, as told by your health care provider.  Do not lift anything that is heavier than 10 lb (4.5 kg) while you have symptoms of sciatica. When you do not have symptoms, you should still avoid heavy lifting, especially repetitive heavy lifting.  When you lift objects, always use proper lifting technique, which includes: ? Bending your knees. ? Keeping the load close to your body. ? Avoiding twisting. General instructions  Use good posture. ? Avoid leaning forward while sitting. ? Avoid hunching over while standing.  Maintain a healthy weight. Excess weight puts extra stress on your back and makes it difficult to maintain good posture.  Wear supportive, comfortable shoes. Avoid wearing high heels.  Avoid sleeping on a mattress that is too soft or too hard. A mattress that is firm enough to support your back when you sleep may help to reduce your pain.  Keep all follow-up visits as told by your health care provider. This is important. Contact a health care provider if:  You have pain that wakes you up when you are sleeping.  You have pain that gets worse when you lie down.  Your pain is worse than you have experienced in the past.  Your pain lasts longer than 4 weeks.  You experience unexplained weight loss. Get help right away if:  You lose control  of your bowel or bladder (incontinence).  You have: ? Weakness in your lower back, pelvis, buttocks, or legs that gets worse. ? Redness or swelling of your back. ? A burning sensation when you urinate. This information is not intended to replace advice given to you by your health care provider. Make sure you discuss any questions you have with your health care provider. Document Released: 01/11/2001 Document Revised: 06/23/2015 Document Reviewed: 09/26/2014 Elsevier Interactive Patient Education  2018 [employer].  Consider Co-Q 10 Try melatonin 3 mg at the EXACT same time of night for three weeks Try vitamin B12 500 mcg daily sublingual  Fall Prevention in the Home Falls can cause injuries. They can happen to people of all ages. There are many things you can do to make your home safe and to help prevent falls. What can I do on the outside of my home?  Regularly fix the edges of walkways and driveways and fix any cracks.  Remove anything that  might make you trip as you walk through a door, such as a raised step or threshold.  Trim any bushes or trees on the path to your home.  Use bright outdoor lighting.  Clear any walking paths of anything that might make someone trip, such as rocks or tools.  Regularly check to see if handrails are loose or broken. Make sure that both [place] of any steps have handrails.  Any raised decks and porches should have guardrails on the edges.  Have any leaves, snow, or ice cleared regularly.  Use sand or salt on walking paths during winter.  Clean up any spills in your garage right away. This includes oil or grease spills. What can I do in the bathroom?  Use night lights.  Install grab bars by the toilet and in the tub and shower. Do not use towel bars as grab bars.  Use non-skid mats or decals in the tub or shower.  If you need to sit down in the shower, use a plastic, non-slip stool.  Keep the floor dry. Clean up any water that spills on  the floor as soon as it happens.  Remove soap buildup in the tub or shower regularly.  Attach bath mats securely with double-sided non-slip rug tape.  Do not have throw rugs and other things on the floor that can make you trip. What can I do in the bedroom?  Use night lights.  Make sure that you have a light by your bed that is easy to reach.  Do not use any sheets or blankets that are too big for your bed. They should not hang down onto the floor.  Have a firm chair that has side arms. You can use this for support while you get dressed.  Do not have throw rugs and other things on the floor that can make you trip. What can I do in the kitchen?  Clean up any spills right away.  Avoid walking on wet floors.  Keep items that you use a lot in easy-to-reach places.  If you need to reach something above you, use a strong step stool that has a grab bar.  Keep electrical cords out of the way.  Do not use floor polish or wax that makes floors slippery. If you must use wax, use non-skid floor wax.  Do not have throw rugs and other things on the floor that can make you trip. What can I do with my stairs?  Do not leave any items on the stairs.  Make sure that there are handrails on both [place] of the stairs and use them. Fix handrails that are broken or loose. Make sure that handrails are as long as the stairways.  Check any carpeting to make sure that it is firmly attached to the stairs. Fix any carpet that is loose or worn.  Avoid having throw rugs at the top or bottom of the stairs. If you do have throw rugs, attach them to the floor with carpet tape.  Make sure that you have a light switch at the top of the stairs and the bottom of the stairs. If you do not have them, ask someone to add them for you. What else can I do to help prevent falls?  Wear shoes that: ? Do not have high heels. ? Have rubber bottoms. ? Are comfortable and fit you well. ? Are closed at the toe. Do not  wear sandals.  If you use a stepladder: ? Make sure that  it is fully opened. Do not climb a closed stepladder. ? Make sure that both [place] of the stepladder are locked into place. ? Ask someone to hold it for you, if possible.  Clearly mark and make sure that you can see: ? Any grab bars or handrails. ? First and last steps. ? Where the edge of each step is.  Use tools that help you move around (mobility aids) if they are needed. These include: ? Canes. ? Walkers. ? Scooters. ? Crutches.  Turn on the lights when you go into a dark area. Replace any light bulbs as soon as they burn out.  Set up your furniture so you have a clear path. Avoid moving your furniture around.  If any of your floors are uneven, fix them.  If there are any pets around you, be aware of where they are.  Review your medicines with your doctor. Some medicines can make you feel dizzy. This can increase your chance of falling. Ask your doctor what other things that you can do to help prevent falls. This information is not intended to replace advice given to you by your health care provider. Make sure you discuss any questions you have with your health care provider. Document Released: 11/13/2008 Document Revised: 06/25/2015 Document Reviewed: 02/21/2014 Elsevier Interactive Patient Education  [employer].

## 2017-09-11 NOTE — Assessment & Plan Note (Signed)
Refer to endo for treatment of osteoporosis; fall precautions; calcium in diet and vitamin D

## 2017-09-11 NOTE — Progress Notes (Signed)
BP 132/64   Pulse 90   Temp 97.8 F (36.6 C) (Oral)   Resp 12   Ht 5' (1.524 m)   Wt 143 lb 9.6 oz (65.1 kg)   SpO2 97%   BMI 28.04 kg/m    Subjective:    Patient ID: Samantha [name], female    DOB: 1928/09/10, 84 y.o.   MRN: [id number]  HPI: Samantha [name] is a 84 y.o. female  Chief Complaint  Patient presents with  . Follow-up  . Sciatica    HPI Patient is here for f/u She also has pain like sciatica Shoots down the LEFT buttock Going on for a month "I can barely do anything" she says She gets so exhausted, she cannot make it Tylenol is not helping with the pain 10 out of 10 pain today, but it's not been this for a month On certain days, comes and goes Started hurting in bed last night She has ice packs at home  High cholesterol; eats sausage occasionally once every 3 months; not much cheese; does eat about 3 eggs a week; on atorvastatin; no muscle aches  On blood thinner; no bleeding from nose, gums, urine, rectum  Right hand and wrist never healed up; she had a fracture; I ordered a DEXA in February 2019; she did not have it done; she has thin bones; she is not taking bisphosphonates; taking vit D; does not want a bone density test; right hand hurts terrible; cannot straighten it out in the mornings; does not want therapy on her hand  Tired and just falls asleep; irregular sleep hours   Functional Status Survey: Is the patient deaf or have difficulty hearing?: Yes Does the patient have difficulty seeing, even when wearing glasses/contacts?: Yes Does the patient have difficulty concentrating, remembering, or making decisions?: No Does the patient have difficulty walking or climbing stairs?: Yes Does the patient have difficulty dressing or bathing?: No Does the patient have difficulty doing errands alone such as visiting a doctor's office or shopping?: No    Office Visit from 09/11/2017 in [facility] [place]  AUDIT-C Score  0        Depression screen [facility] 2/9 09/11/2017 03/21/2017 08/26/2016 08/16/2016 11/24/2015  Decreased Interest 0 0 0 0 0  Down, Depressed, Hopeless 0 1 1 1 3   PHQ - 2 Score 0 1 1 1 3   Altered sleeping - - - - 3  Tired, decreased energy - - - - 3  Change in appetite - - - - 0  Feeling bad or failure about yourself  - - - - 3  Trouble concentrating - - - - 3  Moving slowly or fidgety/restless - - - - 3  Suicidal thoughts - - - - 3  PHQ-9 Score - - - - 21  Difficult doing work/chores - - - - Extremely dIfficult    Relevant past medical, surgical, family and social history reviewed Past Medical History:  Diagnosis Date  . A-fib ([place]) 06/07/2014  . Arm fracture 03/10/2017   right  . CAD (coronary artery disease) 06/07/2014  . Depression 09/24/2015  . Dyslipidemia 08/16/2016  . Dysphonia 08/16/2016  . Hypertension   . Mild aortic stenosis 08/22/2016   Echo July 2018  . Mitral regurgitation 08/22/2016   Echo July 2018  . Stroke [facility])    Past Surgical History:  Procedure Laterality Date  . ABDOMINAL HYSTERECTOMY    . APPENDECTOMY    . CHOLECYSTECTOMY    . FRACTURE SURGERY  Family History  Problem Relation Age of Onset  . Stroke Mother   . Heart attack Father   . Heart disease Sister   . Diabetes Sister   . Heart disease Brother   . Heart attack Brother   . Diabetes Maternal Grandfather   . Ovarian cancer Paternal Grandmother   . Cancer Brother        bladder cancer  . Diabetes Brother   . Heart disease Brother   . Hypertension Brother    Social History   Tobacco Use  . Smoking status: Never Smoker  . Smokeless tobacco: Never Used  Substance Use Topics  . Alcohol use: No  . Drug use: No    Interim medical history since last visit reviewed. Allergies and medications reviewed  Review of Systems Per HPI unless specifically indicated above     Objective:    BP 132/64   Pulse 90   Temp 97.8 F (36.6 C) (Oral)   Resp 12   Ht 5' (1.524 m)   Wt 143 lb 9.6 oz (65.1  kg)   SpO2 97%   BMI 28.04 kg/m   Wt Readings from Last 3 Encounters:  09/11/17 143 lb 9.6 oz (65.1 kg)  03/21/17 146 lb 11.2 oz (66.5 kg)  03/04/17 139 lb (63 kg)    Physical Exam  Constitutional: She appears well-developed and well-nourished. No distress.  HENT:  Head: Normocephalic and atraumatic.  Raspy, stuttering phonation as before; no change  Eyes: EOM are normal. No scleral icterus.  Neck: No thyromegaly present.  Cardiovascular: Normal rate, regular rhythm and normal heart sounds.  No murmur heard. Pulmonary/Chest: Effort normal and breath sounds normal. No respiratory distress. She has no wheezes.  Abdominal: Soft. Bowel sounds are normal. She exhibits no distension.  Musculoskeletal: She exhibits no edema.       Lumbar back: She exhibits decreased range of motion, tenderness and pain. She exhibits no bony tenderness, no swelling, no edema and no deformity.       Back:  Neurological: She is alert.  LE strength 5-/5  Skin: Skin is warm and dry. She is not diaphoretic. No pallor.  Psychiatric: She has a normal mood and affect. Her behavior is normal. Judgment and thought content normal.      Assessment & Plan:   Problem List Items Addressed This Visit      Cardiovascular and Mediastinum   HTN (hypertension)    Well-controlled      A-fib ([place])    On blood thinner, no blood thinner        Musculoskeletal and Integument   Osteoporosis    Refer to endo for treatment of osteoporosis; fall precautions; calcium in diet and vitamin D      Relevant Orders   VITAMIN D 25 Hydroxy (Vit-D Deficiency, Fractures)   Ambulatory referral to Endocrinology     Other   Medication monitoring encounter    Check platelet count and H/H      Relevant Orders   CBC with Differential/Platelet (Completed)   COMPLETE METABOLIC PANEL WITH GFR (Completed)   Hyperglycemia    Check glucose and A1c      Relevant Orders   Hemoglobin A1c (Completed)   Fatigue    Check labs       Dyslipidemia    Check lipids      Relevant Orders   Lipid panel (Completed)    Other Visit Diagnoses    Left buttock pain    -  Primary  Relevant Orders   Ambulatory referral to Physical Therapy   Gait abnormality           Follow up plan: Return in about 3 months (around 12/12/2017).  An after-visit summary was printed and given to the patient at [facility].  Please see the patient instructions which may contain other information and recommendations beyond what is mentioned above in the assessment and plan.  No orders of the defined types were placed in this encounter.   Orders Placed This Encounter  Procedures  . CBC with Differential/Platelet  . COMPLETE METABOLIC PANEL WITH GFR  . Hemoglobin A1c  . Lipid panel  . VITAMIN D 25 Hydroxy (Vit-D Deficiency, Fractures)  . Ambulatory referral to Physical Therapy  . Ambulatory referral to Endocrinology

## 2017-09-11 NOTE — Assessment & Plan Note (Signed)
Check labs 

## 2017-09-11 NOTE — Assessment & Plan Note (Signed)
Check platelet count and H/H

## 2017-09-11 NOTE — Assessment & Plan Note (Signed)
Check lipids 

## 2017-09-11 NOTE — Assessment & Plan Note (Signed)
On blood thinner, no blood thinner

## 2017-09-11 NOTE — Assessment & Plan Note (Signed)
Well controlled 

## 2017-09-11 NOTE — Assessment & Plan Note (Signed)
Check glucose and A1c 

## 2017-09-12 ENCOUNTER — Telehealth: Payer: Self-pay

## 2017-09-12 DIAGNOSIS — E781 Pure hyperglyceridemia: Secondary | ICD-10-CM

## 2017-09-12 LAB — COMPLETE METABOLIC PANEL WITH GFR
AG RATIO: 1.5 (calc) (ref 1.0–2.5)
ALKALINE PHOSPHATASE (APISO): 102 U/L (ref 33–130)
ALT: 13 U/L (ref 6–29)
AST: 15 U/L (ref 10–35)
Albumin: 3.8 g/dL (ref 3.6–5.1)
BUN: 21 mg/dL (ref 7–25)
CHLORIDE: 103 mmol/L (ref 98–110)
CO2: 31 mmol/L (ref 20–32)
Calcium: 9.7 mg/dL (ref 8.6–10.4)
Creat: 0.86 mg/dL (ref 0.60–0.88)
GFR, Est African American: 71 mL/min/{1.73_m2} (ref >=60)
GFR, Est Non African American: 61 mL/min/{1.73_m2} (ref >=60)
GLOBULIN: 2.5 g/dL (ref 1.9–3.7)
Glucose, Bld: 105 mg/dL (ref 65–139)
POTASSIUM: 4.6 mmol/L (ref 3.5–5.3)
SODIUM: 141 mmol/L (ref 135–146)
Total Bilirubin: 0.4 mg/dL (ref 0.2–1.2)
Total Protein: 6.3 g/dL (ref 6.1–8.1)

## 2017-09-12 LAB — LIPID PANEL
CHOLESTEROL: 182 mg/dL (ref <200)
HDL: 36 mg/dL — AB (ref >50)
LDL Cholesterol (Calc): 106 mg/dL (calc) — ABNORMAL HIGH
Non-HDL Cholesterol (Calc): 146 mg/dL (calc) — ABNORMAL HIGH (ref <130)
Total CHOL/HDL Ratio: 5.1 (calc) — ABNORMAL HIGH (ref <5.0)
Triglycerides: 302 mg/dL — ABNORMAL HIGH (ref <150)

## 2017-09-12 LAB — CBC WITH DIFFERENTIAL/PLATELET
BASOS ABS: 28 {cells}/uL (ref 0–200)
Basophils Relative: 0.4 %
EOS ABS: 182 {cells}/uL (ref 15–500)
Eosinophils Relative: 2.6 %
HEMATOCRIT: 41.1 % (ref 35.0–45.0)
HEMOGLOBIN: 13.7 g/dL (ref 11.7–15.5)
Lymphs Abs: 2100 cells/uL (ref 850–3900)
MCH: 31.2 pg (ref 27.0–33.0)
MCHC: 33.3 g/dL (ref 32.0–36.0)
MCV: 93.6 fL (ref 80.0–100.0)
MONOS PCT: 10.9 %
MPV: 10.7 fL (ref 7.5–12.5)
NEUTROS ABS: 3927 {cells}/uL (ref 1500–7800)
Neutrophils Relative %: 56.1 %
Platelets: 287 10*3/uL (ref 140–400)
RBC: 4.39 10*6/uL (ref 3.80–5.10)
RDW: 12.5 % (ref 11.0–15.0)
Total Lymphocyte: 30 %
WBC: 7 10*3/uL (ref 3.8–10.8)
WBCMIX: 763 {cells}/uL (ref 200–950)

## 2017-09-12 LAB — HEMOGLOBIN A1C
Hgb A1c MFr Bld: 5.7 % of total Hgb — ABNORMAL HIGH (ref <5.7)
Mean Plasma Glucose: 117 (calc)
eAG (mmol/L): 6.5 (calc)

## 2017-09-12 NOTE — Telephone Encounter (Signed)
-----   Message from Arnetha Courser, MD sent at 09/12/2017  9:31 AM EDT ----- Roselyn Reef, please let the patient know that her CBC is normal; she is not anemic; she is prediabetic, so try to lose a little weight and avoid sugary drinks and sweets and white bread; her TG are high, over 300; encourage her to cut out fried foods and starches like potatoes; I'm going to recommend that she take two krill oil pills twice a day and we'll recheck her labs FASTING in 8 weeks (please ORDER, dx: hypertriglyceridemia); thank you

## 2017-09-26 ENCOUNTER — Other Ambulatory Visit: Payer: Self-pay | Admitting: Family Medicine

## 2017-10-05 ENCOUNTER — Other Ambulatory Visit: Payer: Self-pay | Admitting: Family Medicine

## 2017-10-16 ENCOUNTER — Other Ambulatory Visit: Payer: Self-pay | Admitting: Family Medicine

## 2017-10-16 NOTE — Telephone Encounter (Signed)
She is taking 2 capsules a day as directed? But states if you want to decrease to 1 tablet she is ok with that

## 2017-10-16 NOTE — Telephone Encounter (Signed)
Please find out how patient is actually taking this (fluoxetine) It looks like she would have run out in July I'd prefer her to be on 20 mg daily if she can take that dose

## 2017-10-23 ENCOUNTER — Other Ambulatory Visit: Payer: Self-pay | Admitting: Nurse Practitioner

## 2017-11-01 ENCOUNTER — Other Ambulatory Visit: Payer: Self-pay | Admitting: Nurse Practitioner

## 2017-11-20 ENCOUNTER — Other Ambulatory Visit: Payer: Self-pay | Admitting: Family Medicine

## 2017-11-20 DIAGNOSIS — Z23 Encounter for immunization: Secondary | ICD-10-CM | POA: Diagnosis not present

## 2017-11-20 NOTE — Telephone Encounter (Signed)
Patient is due for fasting labs; please see note on the August lipid panel I'll send limited Rx and we will see if that needs to be changed based on her lab results

## 2017-11-20 NOTE — Telephone Encounter (Signed)
Pt.notified

## 2017-11-27 DIAGNOSIS — Z23 Encounter for immunization: Secondary | ICD-10-CM | POA: Diagnosis not present

## 2017-11-29 ENCOUNTER — Other Ambulatory Visit: Payer: Self-pay | Admitting: Nurse Practitioner

## 2017-12-06 DIAGNOSIS — I2510 Atherosclerotic heart disease of native coronary artery without angina pectoris: Secondary | ICD-10-CM | POA: Diagnosis not present

## 2017-12-06 DIAGNOSIS — E7800 Pure hypercholesterolemia, unspecified: Secondary | ICD-10-CM | POA: Diagnosis not present

## 2017-12-06 DIAGNOSIS — I25118 Atherosclerotic heart disease of native coronary artery with other forms of angina pectoris: Secondary | ICD-10-CM | POA: Diagnosis not present

## 2017-12-06 DIAGNOSIS — R0602 Shortness of breath: Secondary | ICD-10-CM | POA: Diagnosis not present

## 2017-12-06 DIAGNOSIS — I10 Essential (primary) hypertension: Secondary | ICD-10-CM | POA: Diagnosis not present

## 2017-12-06 DIAGNOSIS — I480 Paroxysmal atrial fibrillation: Secondary | ICD-10-CM | POA: Diagnosis not present

## 2017-12-06 DIAGNOSIS — R471 Dysarthria and anarthria: Secondary | ICD-10-CM | POA: Diagnosis not present

## 2017-12-12 ENCOUNTER — Ambulatory Visit: Payer: Medicare Other | Admitting: Family Medicine

## 2018-01-29 ENCOUNTER — Other Ambulatory Visit: Payer: Self-pay | Admitting: Nurse Practitioner

## 2018-01-29 NOTE — Telephone Encounter (Signed)
Please see note from November 20, 2017 She is overdue for labs  (vit D and lipids)

## 2018-01-30 NOTE — Telephone Encounter (Signed)
Pt.notified

## 2018-02-13 ENCOUNTER — Other Ambulatory Visit: Payer: Self-pay | Admitting: Family Medicine

## 2018-02-15 ENCOUNTER — Other Ambulatory Visit: Payer: Self-pay | Admitting: Family Medicine

## 2018-02-16 ENCOUNTER — Other Ambulatory Visit: Payer: Self-pay | Admitting: Family Medicine

## 2018-02-16 NOTE — Telephone Encounter (Signed)
Left detailed VM. CRM created.  

## 2018-02-16 NOTE — Telephone Encounter (Signed)
She was due for fasting labs 8 weeks after the September 11, 2017 blood work  As per my notes from October and December, patient is overdue for labs  She is also due for an appointment now, canceled her appt in November I only sent a week of cholesterol medicine back in October, hoping to get her labs done She would have run out of her medicine in late October  Please ask her to schedule an appt, first available and we'll get labs at her visit I'll approve one more week of medicine and we look forward to seeing her soon

## 2018-02-26 ENCOUNTER — Other Ambulatory Visit: Payer: Self-pay | Admitting: Family Medicine

## 2018-02-26 NOTE — Telephone Encounter (Signed)
Patient scheduled for appt tomorrow.

## 2018-02-27 ENCOUNTER — Encounter: Payer: Self-pay | Admitting: Family Medicine

## 2018-02-27 ENCOUNTER — Ambulatory Visit (INDEPENDENT_AMBULATORY_CARE_PROVIDER_SITE_OTHER): Payer: Medicare Other | Admitting: Family Medicine

## 2018-02-27 VITALS — BP 120/76 | HR 72 | Temp 97.8°F | Resp 12 | Ht 60.0 in | Wt 144.0 lb

## 2018-02-27 DIAGNOSIS — M8000XD Age-related osteoporosis with current pathological fracture, unspecified site, subsequent encounter for fracture with routine healing: Secondary | ICD-10-CM | POA: Diagnosis not present

## 2018-02-27 DIAGNOSIS — F331 Major depressive disorder, recurrent, moderate: Secondary | ICD-10-CM | POA: Diagnosis not present

## 2018-02-27 DIAGNOSIS — I10 Essential (primary) hypertension: Secondary | ICD-10-CM

## 2018-02-27 DIAGNOSIS — I482 Chronic atrial fibrillation, unspecified: Secondary | ICD-10-CM | POA: Diagnosis not present

## 2018-02-27 DIAGNOSIS — R490 Dysphonia: Secondary | ICD-10-CM

## 2018-02-27 DIAGNOSIS — Z9181 History of falling: Secondary | ICD-10-CM | POA: Insufficient documentation

## 2018-02-27 DIAGNOSIS — Z5181 Encounter for therapeutic drug level monitoring: Secondary | ICD-10-CM

## 2018-02-27 DIAGNOSIS — E785 Hyperlipidemia, unspecified: Secondary | ICD-10-CM | POA: Diagnosis not present

## 2018-02-27 DIAGNOSIS — E559 Vitamin D deficiency, unspecified: Secondary | ICD-10-CM | POA: Diagnosis not present

## 2018-02-27 DIAGNOSIS — E781 Pure hyperglyceridemia: Secondary | ICD-10-CM

## 2018-02-27 MED ORDER — FAMOTIDINE 20 MG PO TABS: 20.0000 mg | ORAL_TABLET | Freq: Two times a day (BID) | ORAL | 2 refills | Status: DC | PRN

## 2018-02-27 NOTE — Progress Notes (Signed)
BP 120/76   Pulse 72   Temp 97.8 F (36.6 C) (Oral)   Resp 12   Ht 5' (1.524 m)   Wt 144 lb (65.3 kg)   SpO2 97%   BMI 28.12 kg/m    Subjective:    Patient ID: Samantha [name], female    DOB: 02/14/1928, 85 y.o.   MRN: [id number]  HPI: Samantha [name] is a 85 y.o. female  Chief Complaint  Patient presents with  . Follow-up    labs    HPI Patient is here for f/u; accompanied by her sister She is feeling her age She just does not feel good; she does not know how to explain it; thinks it might be depression and gets down, what's the use of doing all this stuff? Just tired; just tired cleaning out the refrigerator; not extending herrself any more; let her house go; just can't do as much; takes her about 45 minutes to get going in the morning; she is sure she is not missing doses, she counts them out; she has problems with transportation; patient politely declined CCM services  Hearing has gotten so bad; just sitting at the table with a crowd, cannot understand a word they say; she got a new hearing aide just 2 months ago, but she cannot hear; I encouraged her to go back; vision is difficult when watching TV because of closed captioning moves so fast  BP today is fantastic  Mood; daughter died in 02-04-2023; that has had an impact; daughter had a heart attack in [place] and there was no closure for patient  Osteoporosis and stumbling a lot, walking into walls; she does not know whether she is going to fall or not; she is very cautious about fall risk  GERD; ranitidine  Depression screen [facility] 2/9 02/27/2018 02/27/2018 02/27/2018 09/11/2017 03/21/2017  Decreased Interest 1 2 - 0 0  Down, Depressed, Hopeless - 3 3 0 1  PHQ - 2 Score 1 5 3  0 1  Altered sleeping - 1 1 - -  Tired, decreased energy - 3 3 - -  Change in appetite - 0 0 - -  Feeling bad or failure about yourself  - 1 1 - -  Trouble concentrating - 1 1 - -  Moving slowly or fidgety/restless - 0 0 - -  Suicidal thoughts - 0  0 - -  PHQ-9 Score - 11 9 - -  Difficult doing work/chores - Somewhat difficult Somewhat difficult - -   Fall Risk  02/27/2018 09/11/2017 03/21/2017 08/26/2016 08/16/2016  Falls in the past year? 0 Yes Yes Yes Yes  Number falls in past yr: 0 1 2 or more - 1  Injury with Fall? 0 Yes Yes No Yes  Comment - - broke right arm - -  Risk Factor Category  - - - High Fall Risk High Fall Risk  Follow up - - - - Falls prevention discussed    Relevant past medical, surgical, family and social history reviewed Past Medical History:  Diagnosis Date  . A-fib ([place]) 06/07/2014  . Arm fracture 03/10/2017   right  . CAD (coronary artery disease) 06/07/2014  . Depression 09/24/2015  . Dyslipidemia 08/16/2016  . Dysphonia 08/16/2016  . Hypertension   . Mild aortic stenosis 08/22/2016   Echo July 2018  . Mitral regurgitation 08/22/2016   Echo July 2018  . Stroke [facility])    Past Surgical History:  Procedure Laterality Date  . ABDOMINAL HYSTERECTOMY    .  APPENDECTOMY    . CHOLECYSTECTOMY    . FRACTURE SURGERY     Family History  Problem Relation Age of Onset  . Stroke Mother   . Heart attack Father   . Heart disease Sister   . Diabetes Sister   . Heart disease Brother   . Heart attack Brother   . Diabetes Maternal Grandfather   . Ovarian cancer Paternal Grandmother   . Cancer Brother        bladder cancer  . Diabetes Brother   . Heart disease Brother   . Hypertension Brother   . Heart attack Daughter    Social History   Tobacco Use  . Smoking status: Never Smoker  . Smokeless tobacco: Never Used  Substance Use Topics  . Alcohol use: No  . Drug use: No     [facility] Visit from 02/27/2018 in [facility]  AUDIT-C Score  0      Interim medical history since last visit reviewed. Allergies and medications reviewed  Review of Systems Per HPI unless specifically indicated above     Objective:    BP 120/76   Pulse 72   Temp 97.8 F (36.6 C) (Oral)   Resp 12   Ht 5'  (1.524 m)   Wt 144 lb (65.3 kg)   SpO2 97%   BMI 28.12 kg/m   Wt Readings from Last 3 Encounters:  02/27/18 144 lb (65.3 kg)  09/11/17 143 lb 9.6 oz (65.1 kg)  03/21/17 146 lb 11.2 oz (66.5 kg)    Physical Exam Constitutional:      General: She is not in acute distress.    Appearance: She is well-developed. She is not diaphoretic.     Comments: overweight  HENT:     Head: Normocephalic and atraumatic.     Comments: Voice somewhat hoarse as before    Right Ear: Decreased hearing noted.     Left Ear: Decreased hearing noted.  Eyes:     General: No scleral icterus. Neck:     Thyroid: No thyromegaly.  Cardiovascular:     Rate and Rhythm: Normal rate.     Heart sounds: Normal heart sounds. No murmur.  Pulmonary:     Effort: Pulmonary effort is normal. No respiratory distress.     Breath sounds: Normal breath sounds. No wheezing.  Abdominal:     General: Bowel sounds are normal. There is no distension.     Palpations: Abdomen is soft.  Skin:    General: Skin is warm and dry.     Coloration: Skin is not pale.  Neurological:     Mental Status: She is alert.  Psychiatric:        Mood and Affect: Mood is depressed (mildly depressed when sister discussed patient's daughter dying, no closure). Mood is not anxious.        Behavior: Behavior normal.        Thought Content: Thought content normal.        Judgment: Judgment normal.     Comments: Good hygiene, well-dressed, good eye contact with examiner       Assessment & Plan:   Problem List Items Addressed This Visit      Cardiovascular and Mediastinum   HTN (hypertension) (Chronic)    Controlled today      A-fib (HCC) (Chronic)    Managed by cardiologist; on factor Xa inhibitor; no bleeding      Relevant Orders   TSH (Completed)     Musculoskeletal and  Integument   Osteoporosis - Primary    Check vit D level; fall precautions encouraged; see AVS; calcium and vit D        Other   Medication monitoring encounter    Relevant Orders   CBC (Completed)   COMPLETE METABOLIC PANEL WITH GFR (Completed)   Dyslipidemia    Check lipids; limit saturated fats      Relevant Orders   Lipid panel (Completed)   At risk for falls    Strongly urged her to do PT but patient declines adamantly      Dysphonia    Chronic, unchanged      Depression    Patient refuses treatment changes, referral to psychiatrist; did recommend grief counseling, number given for Hospice       Other Visit Diagnoses    Hypertriglyceridemia       check labs   Relevant Orders   Lipid panel (Completed)   Vitamin D deficiency       check lab   Relevant Orders   VITAMIN D 25 Hydroxy (Vit-D Deficiency, Fractures) (Completed)       Follow up plan: Return in about 4 months (around 06/28/2018) for follow-up visit with Dr. Sanda Klein.  An after-visit summary was printed and given to the patient at [place].  Please see the patient instructions which may contain other information and recommendations beyond what is mentioned above in the assessment and plan.  Meds ordered this encounter  Medications  . famotidine (PEPCID) 20 MG tablet    Sig: Take 1 tablet (20 mg total) by mouth 2 (two) times daily as needed for heartburn or indigestion.    Dispense:  60 tablet    Refill:  2    Orders Placed This Encounter  Procedures  . CBC  . COMPLETE METABOLIC PANEL WITH GFR  . Lipid panel  . TSH  . VITAMIN D 25 Hydroxy (Vit-D Deficiency, Fractures)

## 2018-02-27 NOTE — Assessment & Plan Note (Signed)
Strongly urged her to do PT but patient declines adamantly

## 2018-02-27 NOTE — Patient Instructions (Addendum)
Please do consider chronic care management Let us know how we can help you  Please do contact the provider who is managing your hearing aides  You can contact Hospice for grief counseling Main Office [address] [place], [place] [address] General Information Phone: [phone] Toll-free: [phone]  Try to limit saturated fats in your diet (bologna, hot dogs, barbeque, cheeseburgers, hamburgers, steak, bacon, sausage, cheese, etc.) and get more fresh fruits, vegetables, and whole grains

## 2018-02-27 NOTE — Assessment & Plan Note (Signed)
Check vit D level; fall precautions encouraged; see AVS; calcium and vit D

## 2018-02-27 NOTE — Assessment & Plan Note (Signed)
Check lipids; limit saturated fats 

## 2018-02-27 NOTE — Assessment & Plan Note (Signed)
Controlled today 

## 2018-02-28 ENCOUNTER — Other Ambulatory Visit: Payer: Self-pay | Admitting: Family Medicine

## 2018-02-28 ENCOUNTER — Encounter: Payer: Self-pay | Admitting: Family Medicine

## 2018-02-28 LAB — COMPLETE METABOLIC PANEL WITH GFR
AG Ratio: 1.4 (calc) (ref 1.0–2.5)
ALBUMIN MSPROF: 3.7 g/dL (ref 3.6–5.1)
ALT: 17 U/L (ref 6–29)
AST: 19 U/L (ref 10–35)
Alkaline phosphatase (APISO): 97 U/L (ref 33–130)
BUN: 15 mg/dL (ref 7–25)
CALCIUM: 9.8 mg/dL (ref 8.6–10.4)
CO2: 30 mmol/L (ref 20–32)
Chloride: 102 mmol/L (ref 98–110)
Creat: 0.77 mg/dL (ref 0.60–0.88)
GFR, EST AFRICAN AMERICAN: 80 mL/min/{1.73_m2} (ref >=60)
GFR, EST NON AFRICAN AMERICAN: 69 mL/min/{1.73_m2} (ref >=60)
GLUCOSE: 111 mg/dL — AB (ref 65–99)
Globulin: 2.7 g/dL (calc) (ref 1.9–3.7)
POTASSIUM: 4.2 mmol/L (ref 3.5–5.3)
SODIUM: 140 mmol/L (ref 135–146)
TOTAL PROTEIN: 6.4 g/dL (ref 6.1–8.1)
Total Bilirubin: 0.7 mg/dL (ref 0.2–1.2)

## 2018-02-28 LAB — LIPID PANEL
CHOL/HDL RATIO: 5.7 (calc) — AB (ref <5.0)
Cholesterol: 187 mg/dL (ref <200)
HDL: 33 mg/dL — ABNORMAL LOW (ref >50)
LDL Cholesterol (Calc): 124 mg/dL (calc) — ABNORMAL HIGH
NON-HDL CHOLESTEROL (CALC): 154 mg/dL — AB (ref <130)
TRIGLYCERIDES: 182 mg/dL — AB (ref <150)

## 2018-02-28 LAB — CBC
HCT: 41.1 % (ref 35.0–45.0)
HEMOGLOBIN: 13.9 g/dL (ref 11.7–15.5)
MCH: 31 pg (ref 27.0–33.0)
MCHC: 33.8 g/dL (ref 32.0–36.0)
MCV: 91.7 fL (ref 80.0–100.0)
MPV: 10.7 fL (ref 7.5–12.5)
Platelets: 278 10*3/uL (ref 140–400)
RBC: 4.48 10*6/uL (ref 3.80–5.10)
RDW: 12.1 % (ref 11.0–15.0)
WBC: 6.4 10*3/uL (ref 3.8–10.8)

## 2018-02-28 LAB — VITAMIN D 25 HYDROXY (VIT D DEFICIENCY, FRACTURES): Vit D, 25-Hydroxy: 42 ng/mL (ref 30–100)

## 2018-02-28 LAB — TSH: TSH: 2.68 m[IU]/L (ref 0.40–4.50)

## 2018-02-28 MED ORDER — EZETIMIBE 10 MG PO TABS: 10.0000 mg | ORAL_TABLET | Freq: Every day | ORAL | 11 refills | Status: DC

## 2018-02-28 NOTE — Progress Notes (Signed)
Add zetia, letter to patient

## 2018-02-28 NOTE — Assessment & Plan Note (Signed)
Patient refuses treatment changes, referral to psychiatrist; did recommend grief counseling, number given for Hospice

## 2018-02-28 NOTE — Assessment & Plan Note (Signed)
Chronic,unchanged.

## 2018-02-28 NOTE — Progress Notes (Signed)
Samantha [name], please mail letter to patient with lab results

## 2018-02-28 NOTE — Assessment & Plan Note (Signed)
Managed by cardiologist; on factor Xa inhibitor; no bleeding

## 2018-03-12 ENCOUNTER — Other Ambulatory Visit: Payer: Self-pay | Admitting: Family Medicine

## 2018-04-23 ENCOUNTER — Other Ambulatory Visit: Payer: Self-pay | Admitting: Family Medicine

## 2018-05-07 ENCOUNTER — Other Ambulatory Visit: Payer: Self-pay | Admitting: Family Medicine

## 2018-07-03 ENCOUNTER — Ambulatory Visit: Payer: Medicare Other | Admitting: Nurse Practitioner

## 2018-07-03 NOTE — Progress Notes (Signed)
Canceled.

## 2018-10-29 ENCOUNTER — Telehealth: Payer: Self-pay | Admitting: Family Medicine

## 2018-10-29 NOTE — Telephone Encounter (Signed)
Requested medication (s) are due for refill today: yes  Requested medication (s) are on the active medication list: yes  Last refill:  08/03/2018  Future visit scheduled: no  Notes to clinic:  Review for refill   Requested Prescriptions  Pending Prescriptions Disp Refills   atorvastatin (LIPITOR) 40 MG tablet [Pharmacy Med Name: ATORVASTATIN 40 MG TABLET] 180 tablet 0    Sig: Take 2 tablets (80 mg total) by mouth at bedtime.     Cardiovascular:  Antilipid - Statins Failed - 10/29/2018  2:15 PM      Failed - LDL in normal range and within 360 days    LDL Cholesterol (Calc)  Date Value Ref Range Status  02/27/2018 124 (H) mg/dL (calc) Final    Comment:    Reference range: <100 . Desirable range <100 mg/dL for primary prevention;   <70 mg/dL for patients with CHD or diabetic patients  with > or = 2 CHD risk factors. Marland Kitchen LDL-C is now calculated using the Martin-Hopkins  calculation, which is a validated novel method providing  better accuracy than the Friedewald equation in the  estimation of LDL-C.  Cresenciano Genre et al. Annamaria Helling. [id number]): 2061-2068  (http://education.[employer].com/faq/FAQ164)          Failed - HDL in normal range and within 360 days    HDL  Date Value Ref Range Status  02/27/2018 33 (L) >50 mg/dL Final         Failed - Triglycerides in normal range and within 360 days    Triglycerides  Date Value Ref Range Status  02/27/2018 182 (H) <150 mg/dL Final         Passed - Total Cholesterol in normal range and within 360 days    Cholesterol  Date Value Ref Range Status  02/27/2018 187 <200 mg/dL Final         Passed - Patient is not pregnant      Passed - Valid encounter within last 12 months    Recent Outpatient Visits          3 months ago Erroneous encounter - disregard   Martinez, NP   8 months ago Age-related osteoporosis with current pathological fracture with routine healing, subsequent encounter    [facility], Satira Anis, MD   1 year ago Left buttock pain   [facility], Satira Anis, MD   1 year ago Other closed fracture of distal end of right radius with routine healing, subsequent encounter   [facility], Satira Anis, MD   2 years ago Transient alteration of awareness   [facility], Satira Anis, MD

## 2018-10-30 NOTE — Telephone Encounter (Signed)
Pt is scheduled for Monday

## 2018-11-05 ENCOUNTER — Ambulatory Visit: Payer: Medicare Other | Admitting: Family Medicine

## 2018-11-13 ENCOUNTER — Other Ambulatory Visit: Payer: Self-pay | Admitting: Family Medicine

## 2018-11-13 NOTE — Telephone Encounter (Signed)
Lvm to schedule

## 2018-11-13 NOTE — Telephone Encounter (Signed)
Please schedule patient for follow up in the next 30 days.  

## 2018-11-13 NOTE — Telephone Encounter (Signed)
Requested medication (s) are due for refill today: yes  Requested medication (s) are on the active medication list: yes  Last refill:  04/24/2018  Future visit scheduled: no  Notes to clinic:  Review for refill Patient has not seen another provider    Requested Prescriptions  Pending Prescriptions Disp Refills   famotidine (PEPCID) 20 MG tablet [Pharmacy Med Name: FAMOTIDINE 20 MG TABLET] 60 tablet 0    Sig: Take 1 tablet (20 mg total) by mouth 2 (two) times daily as needed for heartburn or indigestion.     Gastroenterology:  H2 Antagonists Passed - 11/13/2018 12:40 PM      Passed - Valid encounter within last 12 months    Recent Outpatient Visits          4 months ago Erroneous encounter - disregard   [facility], Bethel Born, NP   8 months ago Age-related osteoporosis with current pathological fracture with routine healing, subsequent encounter   [facility], Satira Anis, MD   1 year ago Left buttock pain   [facility], Satira Anis, MD   1 year ago Other closed fracture of distal end of right radius with routine healing, subsequent encounter   [facility], Satira Anis, MD   2 years ago Transient alteration of awareness   [facility], Satira Anis, MD

## 2018-11-16 DIAGNOSIS — Z23 Encounter for immunization: Secondary | ICD-10-CM | POA: Diagnosis not present

## 2018-11-19 ENCOUNTER — Ambulatory Visit: Payer: Self-pay | Admitting: *Deleted

## 2018-11-19 ENCOUNTER — Encounter: Payer: Self-pay | Admitting: Family Medicine

## 2018-11-19 ENCOUNTER — Other Ambulatory Visit: Payer: Self-pay

## 2018-11-19 ENCOUNTER — Ambulatory Visit (INDEPENDENT_AMBULATORY_CARE_PROVIDER_SITE_OTHER): Payer: Medicare Other | Admitting: Family Medicine

## 2018-11-19 DIAGNOSIS — K219 Gastro-esophageal reflux disease without esophagitis: Secondary | ICD-10-CM

## 2018-11-19 DIAGNOSIS — Z8673 Personal history of transient ischemic attack (TIA), and cerebral infarction without residual deficits: Secondary | ICD-10-CM | POA: Diagnosis not present

## 2018-11-19 DIAGNOSIS — I4891 Unspecified atrial fibrillation: Secondary | ICD-10-CM

## 2018-11-19 DIAGNOSIS — R490 Dysphonia: Secondary | ICD-10-CM | POA: Diagnosis not present

## 2018-11-19 DIAGNOSIS — Z748 Other problems related to care provider dependency: Secondary | ICD-10-CM | POA: Diagnosis not present

## 2018-11-19 DIAGNOSIS — I25111 Atherosclerotic heart disease of native coronary artery with angina pectoris with documented spasm: Secondary | ICD-10-CM | POA: Diagnosis not present

## 2018-11-19 DIAGNOSIS — F331 Major depressive disorder, recurrent, moderate: Secondary | ICD-10-CM | POA: Diagnosis not present

## 2018-11-19 DIAGNOSIS — M8000XA Age-related osteoporosis with current pathological fracture, unspecified site, initial encounter for fracture: Secondary | ICD-10-CM | POA: Insufficient documentation

## 2018-11-19 DIAGNOSIS — R7301 Impaired fasting glucose: Secondary | ICD-10-CM | POA: Diagnosis not present

## 2018-11-19 DIAGNOSIS — E785 Hyperlipidemia, unspecified: Secondary | ICD-10-CM | POA: Diagnosis not present

## 2018-11-19 DIAGNOSIS — M8000XD Age-related osteoporosis with current pathological fracture, unspecified site, subsequent encounter for fracture with routine healing: Secondary | ICD-10-CM

## 2018-11-19 DIAGNOSIS — I10 Essential (primary) hypertension: Secondary | ICD-10-CM | POA: Diagnosis not present

## 2018-11-19 MED ORDER — ISOSORBIDE MONONITRATE ER 60 MG PO TB24: 60.0000 mg | ORAL_TABLET | Freq: Every day | ORAL | 1 refills | Status: DC

## 2018-11-19 MED ORDER — FLUOXETINE HCL 20 MG PO CAPS: 20.0000 mg | ORAL_CAPSULE | Freq: Every day | ORAL | 5 refills | Status: DC

## 2018-11-19 MED ORDER — LISINOPRIL-HYDROCHLOROTHIAZIDE 20-12.5 MG PO TABS: 1.0000 | ORAL_TABLET | Freq: Every day | ORAL | 1 refills | Status: DC

## 2018-11-19 MED ORDER — FAMOTIDINE 20 MG PO TABS: 20.0000 mg | ORAL_TABLET | Freq: Two times a day (BID) | ORAL | 1 refills | Status: DC | PRN

## 2018-11-19 NOTE — Progress Notes (Signed)
Name: Samantha [name]   MRN: [id number]    DOB: 18-Jan-1929   Date:11/19/2018       Progress Note  Subjective  Chief Complaint  Chief Complaint  Patient presents with  . Follow-up  . Medication Refill    isosorbide    I connected with  Samantha [name] on 11/19/18 at  9:20 AM EDT by telephone and verified that I am speaking with the correct person using two identifiers.  I discussed the limitations, risks, security and privacy concerns of performing an evaluation and management service by telephone and the availability of in person appointments. Staff also discussed with the patient that there may be a patient responsible charge related to this service. Patient Location: Home Provider Location: Office Additional Individuals present: None  HPI  HTN: She is checking at home and has been running in normal range.  Denies chest pain, shortness of breath, or BLE edema.  Taking Lisinopril-HCTZ 20-12.5mg  without issues.  Was seeing Dr. Saralyn [name], but lost follow up due to COVID-19 pandemic and not wanting to leave her home.    Atherosclerosis/HLD/CAD: History of stent placement.  She is taking eliquis, zetia, metoprolol and atorvastatin.  She is also taking imdur - needs refill and has not gotten in touch with Dr. Saralyn [name]' office yet - will provide temporary refill for her today.   History Stroke; Afib: She is on eliquis.  She is taking statin therapy, zetia, and is doing well on these.  No signs of bleeding.  Does have residual dysarthria, otherwise she denies residual effects of her prior stroke. Denies chest pain or palpitations.  Depression: She is feeling well overall on Prozac, no concerns today.   [facility] Visit from 11/19/2018 in [facility]  PHQ-9 Total Score  0     Osteoporosis: No recent falls; has good lighting, no loose throw rugs, has what she needs for safety in her home.  She is taking vitamin D Supplement daily.   GERD: Taking famotidine BID; doing  well with this dosing.  No abdominal pain, no regurgitation, no difficulty swallowing.  Hard of Hearing: She is still struggling with difficulty hearing even with her hearing aids.  She has not had transportation - we will connect her with CCM team to see if we can get her some transportation.   Patient Active Problem List   Diagnosis Date Noted  . At risk for falls 02/27/2018  . Closed right radial fracture 03/21/2017  . Osteoporosis 03/21/2017  . Hyperglycemia 08/26/2016  . Mild aortic stenosis 08/22/2016  . Mitral regurgitation 08/22/2016  . Altered mental state 08/18/2016  . Dysphonia 08/16/2016  . Dyslipidemia 08/16/2016  . Forehead trauma, sequela 11/24/2015  . Knee pain, left 11/24/2015  . Rib pain on left side 11/24/2015  . GERD (gastroesophageal reflux disease) 11/24/2015  . Depression 09/24/2015  . Arthralgia of both hands 09/22/2015  . Medication monitoring encounter 09/22/2015  . Headache 09/22/2015  . Fatigue 09/22/2015  . HTN (hypertension) 06/07/2014  . CVA (cerebral infarction) 06/07/2014  . A-fib ([place]) 06/07/2014  . CAD (coronary artery disease) 06/07/2014    Past Surgical History:  Procedure Laterality Date  . ABDOMINAL HYSTERECTOMY    . APPENDECTOMY    . CHOLECYSTECTOMY    . FRACTURE SURGERY      Family History  Problem Relation Age of Onset  . Stroke Mother   . Heart attack Father   . Heart disease Sister   . Diabetes Sister   . Heart disease  Brother   . Heart attack Brother   . Diabetes Maternal Grandfather   . Ovarian cancer Paternal Grandmother   . Cancer Brother        bladder cancer  . Diabetes Brother   . Heart disease Brother   . Hypertension Brother   . Heart attack Daughter     Social History   Socioeconomic History  . Marital status: Married    Spouse name: Not on file  . Number of children: Not on file  . Years of education: Not on file  . Highest education level: Not on file  Occupational History  . Not on file  Social  Needs  . Financial resource strain: Not on file  . Food insecurity    Worry: Not on file    Inability: Not on file  . Transportation needs    Medical: Not on file    Non-medical: Not on file  Tobacco Use  . Smoking status: Never Smoker  . Smokeless tobacco: Never Used  Substance and Sexual Activity  . Alcohol use: No  . Drug use: No  . Sexual activity: Never  Lifestyle  . Physical activity    Days per week: Not on file    Minutes per session: Not on file  . Stress: Not on file  Relationships  . Social Herbalist on phone: Not on file    Gets together: Not on file    Attends religious service: Not on file    Active member of club or organization: Not on file    Attends meetings of clubs or organizations: Not on file    Relationship status: Not on file  . Intimate partner violence    Fear of current or ex partner: Not on file    Emotionally abused: Not on file    Physically abused: Not on file    Forced sexual activity: Not on file  Other Topics Concern  . Not on file  Social History Narrative  . Not on file     Current Outpatient Medications:  .  albuterol (PROVENTIL HFA;VENTOLIN HFA) 108 (90 BASE) MCG/ACT inhaler, Inhale 1-2 puffs into the lungs every 6 (six) hours as needed for wheezing or shortness of breath (pneumonia)., Disp: , Rfl:  .  apixaban (ELIQUIS) 5 MG TABS tablet, Take 1 tablet (5 mg total) by mouth 2 (two) times daily., Disp: 60 tablet, Rfl: 0 .  atorvastatin (LIPITOR) 40 MG tablet, Take 2 tablets (80 mg total) by mouth at bedtime., Disp: 180 tablet, Rfl: 0 .  Cholecalciferol (VITAMIN D-1000 MAX ST) 1000 units tablet, Take 1,000 Units by mouth daily., Disp: , Rfl:  .  ezetimibe (ZETIA) 10 MG tablet, Take 1 tablet (10 mg total) by mouth daily., Disp: 30 tablet, Rfl: 11 .  famotidine (PEPCID) 20 MG tablet, Take 1 tablet (20 mg total) by mouth 2 (two) times daily as needed for heartburn or indigestion., Disp: 30 tablet, Rfl: 0 .  FLUoxetine (PROZAC)  20 MG capsule, Take 1 capsule (20 mg total) by mouth daily. (call doctor with any change in mood), Disp: 30 capsule, Rfl: 5 .  isosorbide mononitrate (IMDUR) 60 MG 24 hr tablet, Take 60 mg by mouth daily., Disp: , Rfl:  .  lisinopril-hydrochlorothiazide (PRINZIDE,ZESTORETIC) 20-12.5 MG tablet, Take 1 tablet by mouth daily., Disp: 90 tablet, Rfl: 1 .  Melatonin 3 MG SUBL, Place 1 tablet under the tongue daily., Disp: , Rfl:  .  metoprolol succinate (TOPROL-XL) 100 MG 24 hr  tablet, Take 1 tablet (100 mg total) by mouth daily. Take with or immediately following a meal. Appointment needed for further refills, Disp: 30 tablet, Rfl: 0 .  Multiple Vitamin (MULTIVITAMIN) tablet, Take 1 tablet by mouth daily., Disp: , Rfl:   Allergies  Allergen Reactions  . Iodine Nausea And Vomiting  . Levaquin [Levofloxacin] Other (See Comments)    Pt states she have nose bleeds and pain in arms and shoulders.    I personally reviewed active problem list, medication list, allergies, notes from last encounter, lab results with the patient/caregiver today.   ROS  Constitutional: Negative for fever or weight change.  Respiratory: Negative for cough and shortness of breath.   Cardiovascular: Negative for chest pain or palpitations.  Gastrointestinal: Negative for abdominal pain, no bowel changes.  Musculoskeletal: Negative for gait problem or joint swelling.  Skin: Negative for rash.  Neurological: Negative for dizziness or headache.  No other specific complaints in a complete review of systems (except as listed in HPI above).   Objective  Virtual encounter, vitals not obtained.  There is no height or weight on file to calculate BMI.  Physical Exam  Pulmonary/Chest: Effort normal. No respiratory distress. Speaking in complete sentences Neurological: Pt is alert and oriented to person, place, and time. Speech is normal. Psychiatric: Patient has a normal mood and affect. behavior is normal. Judgment and  thought content normal.  No results found for this or any previous visit (from the past 72 hour(s)).  PHQ2/9: Depression screen [facility] 2/9 11/19/2018 02/27/2018 02/27/2018 02/27/2018 09/11/2017  Decreased Interest 0 1 2 - 0  Down, Depressed, Hopeless 0 - 3 3 0  PHQ - 2 Score 0 1 5 3  0  Altered sleeping 0 - 1 1 -  Tired, decreased energy 0 - 3 3 -  Change in appetite 0 - 0 0 -  Feeling bad or failure about yourself  0 - 1 1 -  Trouble concentrating 0 - 1 1 -  Moving slowly or fidgety/restless 0 - 0 0 -  Suicidal thoughts 0 - 0 0 -  PHQ-9 Score 0 - 11 9 -  Difficult doing work/chores Not difficult at all - Somewhat difficult Somewhat difficult -   PHQ-2/9 Result is negative.    Fall Risk: Fall Risk  11/19/2018 02/27/2018 09/11/2017 03/21/2017 08/26/2016  Falls in the past year? 0 0 Yes Yes Yes  Number falls in past yr: 0 0 1 2 or more -  Injury with Fall? 0 0 Yes Yes No  Comment - - - broke right arm -  Risk Factor Category  - - - - High Fall Risk  Follow up Falls evaluation completed - - - -    Assessment & Plan 1. Essential hypertension - Stable per her report - lisinopril-hydrochlorothiazide (ZESTORETIC) 20-12.5 MG tablet; Take 1 tablet by mouth daily.  Dispense: 90 tablet; Refill: 1 - COMPLETE METABOLIC PANEL WITH GFR  2. Coronary artery disease involving native coronary artery of native heart with angina pectoris with documented spasm (HCC) - isosorbide mononitrate (IMDUR) 60 MG 24 hr tablet; Take 1 tablet (60 mg total) by mouth daily.  Dispense: 90 tablet; Refill: 1 - Lipid panel  3. Dyslipidemia - Lipid panel - Continue Statin and Zetia therapy  4. Atrial fibrillation, unspecified type ([place]) - She needs to return to Dr. Saralyn [name], however she has no transportation for appointments at this time; we will provide refill of her medications to hold her over, and referral to [facility] team  to obtain transportation is placed.  - isosorbide mononitrate (IMDUR) 60 MG 24 hr tablet; Take 1  tablet (60 mg total) by mouth daily.  Dispense: 90 tablet; Refill: 1  5. History of ischemic stroke - Continue Eliquis and lipid management  6. Dysphonia - Stable; late effect of prior stroke  7. Age-related osteoporosis with current pathological fracture with routine healing, subsequent encounter - Taking vitamin D supplement.  Safety precautions to prevent falls are in place in her home.  8. Moderate episode of recurrent major depressive disorder (HCC) - Stable - FLUoxetine (PROZAC) 20 MG capsule; Take 1 capsule (20 mg total) by mouth daily. (call doctor with any change in mood)  Dispense: 30 capsule; Refill: 5  9. Gastroesophageal reflux disease, unspecified whether esophagitis present - Stabl3 - famotidine (PEPCID) 20 MG tablet; Take 1 tablet (20 mg total) by mouth 2 (two) times daily as needed for heartburn or indigestion.  Dispense: 180 tablet; Refill: 1  10. Assistance needed with transportation - Ambulatory referral to Chronic Care Management Services  11. IFG (impaired fasting glucose) - Hemoglobin A1c - COMPLETE METABOLIC PANEL WITH GFR   I discussed the assessment and treatment plan with the patient. The patient was provided an opportunity to ask questions and all were answered. The patient agreed with the plan and demonstrated an understanding of the instructions.   The patient was advised to call back or seek an in-person evaluation if the symptoms worsen or if the condition fails to improve as anticipated.  I provided 22 minutes of non-face-to-face time during this encounter.  Hubbard Hartshorn, FNP

## 2018-11-19 NOTE — Chronic Care Management (AMB) (Signed)
  Chronic Care Management   Social Work Note  11/19/2018 Name: Samantha [name] MRN: [name] DOB: [birth year]  Samantha [name] is a 85 y.o. year old female who sees Lada, Satira Anis, MD for primary care. The CCM team was consulted for assistance with Transportation Needs .   Referral made to the Care Guide team for assistance with transportation resources.  Outpatient Encounter Medications as of 11/19/2018  Medication Sig Note  . albuterol (PROVENTIL HFA;VENTOLIN HFA) 108 (90 BASE) MCG/ACT inhaler Inhale 1-2 puffs into the lungs every 6 (six) hours as needed for wheezing or shortness of breath (pneumonia).   Marland Kitchen apixaban (ELIQUIS) 5 MG TABS tablet Take 1 tablet (5 mg total) by mouth 2 (two) times daily.   Marland Kitchen atorvastatin (LIPITOR) 40 MG tablet Take 2 tablets (80 mg total) by mouth at bedtime.   . Cholecalciferol (VITAMIN D-1000 MAX ST) 1000 units tablet Take 1,000 Units by mouth daily. 08/26/2016: Taking 5,000 units every three days until she can get to the pharmacy to get a 1,000.  Marland Kitchen ezetimibe (ZETIA) 10 MG tablet Take 1 tablet (10 mg total) by mouth daily.   . famotidine (PEPCID) 20 MG tablet Take 1 tablet (20 mg total) by mouth 2 (two) times daily as needed for heartburn or indigestion.   Marland Kitchen FLUoxetine (PROZAC) 20 MG capsule Take 1 capsule (20 mg total) by mouth daily. (call doctor with any change in mood)   . isosorbide mononitrate (IMDUR) 60 MG 24 hr tablet Take 1 tablet (60 mg total) by mouth daily.   Marland Kitchen lisinopril-hydrochlorothiazide (ZESTORETIC) 20-12.5 MG tablet Take 1 tablet by mouth daily.   . Melatonin 3 MG SUBL Place 1 tablet under the tongue daily.   . metoprolol succinate (TOPROL-XL) 100 MG 24 hr tablet Take 1 tablet (100 mg total) by mouth daily. Take with or immediately following a meal. Appointment needed for further refills   . Multiple Vitamin (MULTIVITAMIN) tablet Take 1 tablet by mouth daily.    No facility-administered encounter medications on file as of 11/19/2018.      Goals Addressed   None     Follow Up Plan: Client will engage with the care guide team for transportation resources   [place], [place]/[facility] [phone]

## 2018-11-23 ENCOUNTER — Telehealth: Payer: Self-pay

## 2018-11-23 NOTE — Telephone Encounter (Signed)
Copied from [place] [id number]. Topic: Referral - Status >> Nov 23, 2018  [id number] PM Simone Curia D wrote: [id number] Spoke with patient about [place] right now there is no charge due to [employer].  Ambrose Mantle [phone]

## 2018-11-30 ENCOUNTER — Telehealth: Payer: Self-pay | Admitting: Family Medicine

## 2018-11-30 ENCOUNTER — Telehealth: Payer: Self-pay

## 2018-11-30 NOTE — Telephone Encounter (Addendum)
Received message from [facility] asking that we call to check on patient as she sounded congested and tired.   Please call to check on patient.  If needing care, she will need to go to [facility] or ER.

## 2018-11-30 NOTE — Telephone Encounter (Signed)
Documentation reviewed 

## 2018-11-30 NOTE — Telephone Encounter (Signed)
Copied from [employer] [id number]. Topic: Referral - Status >> Nov 30, 2018  [id number] PM Simone Curia D wrote: [id number] Spoke with patient she is not feeling well but will call [facility] when she needs transportation. She is already in their system and rides are free until further notice.  Ambrose Mantle [phone]

## 2018-11-30 NOTE — Telephone Encounter (Signed)
Spoke with patient and she stated that she feels fine and that she is not sick.  Patient stated that she is just tired and has been sleeping more because she been bored due to being in the house all the time.  Patient stated that if she starts feeling bad she will go to the UC or ER and thanks for checking on her.

## 2019-01-23 ENCOUNTER — Other Ambulatory Visit: Payer: Self-pay | Admitting: Family Medicine

## 2019-04-19 ENCOUNTER — Telehealth: Payer: Self-pay | Admitting: Family Medicine

## 2019-04-19 NOTE — Telephone Encounter (Signed)
Requested medication (s) are due for refill today: no  Requested medication (s) are on the active medication list: yes  Last refill:  01/23/2019 / Future visit scheduled: no  Notes to clinic:  Cardiovascular: Antilipid - statins failed  Requested Prescriptions  Pending Prescriptions Disp Refills   atorvastatin (LIPITOR) 40 MG tablet [Pharmacy Med Name: ATORVASTATIN 40 MG TABLET] 180 tablet 0    Sig: Take 2 tablets (80 mg total) by mouth at bedtime.      Cardiovascular:  Antilipid - Statins Failed - 04/19/2019 12:33 PM      Failed - Total Cholesterol in normal range and within 360 days    Cholesterol  Date Value Ref Range Status  02/27/2018 187 <200 mg/dL Final          Failed - LDL in normal range and within 360 days    LDL Cholesterol (Calc)  Date Value Ref Range Status  02/27/2018 124 (H) mg/dL (calc) Final    Comment:    Reference range: <100 . Desirable range <100 mg/dL for primary prevention;   <70 mg/dL for patients with CHD or diabetic patients  with > or = 2 CHD risk factors. Marland Kitchen LDL-C is now calculated using the Martin-Hopkins  calculation, which is a validated novel method providing  better accuracy than the Friedewald equation in the  estimation of LDL-C.  Cresenciano Genre et al. Annamaria Helling. [id number]): [id number]  (http://education.[facility].com/faq/FAQ164)           Failed - HDL in normal range and within 360 days    HDL  Date Value Ref Range Status  02/27/2018 33 (L) >50 mg/dL Final          Failed - Triglycerides in normal range and within 360 days    Triglycerides  Date Value Ref Range Status  02/27/2018 182 (H) <150 mg/dL Final          Passed - Patient is not pregnant      Passed - Valid encounter within last 12 months    Recent Outpatient Visits           5 months ago Essential hypertension   [place], [place], FNP   9 months ago Erroneous encounter - disregard   [place], NP   1 year ago Age-related osteoporosis with current pathological fracture with routine healing, subsequent encounter   [facility] Lada, Satira Anis, MD   1 year ago Left buttock pain   [place], Satira Anis, MD   2 years ago Other closed fracture of distal end of right radius with routine healing, subsequent encounter   [facility] Lada, Satira Anis, MD

## 2019-04-19 NOTE — Telephone Encounter (Signed)
Pt is overdue for labs and in person follow up Samantha [name] previously ordered by Raelyn Ensign with last encounter 5 months ago One month supply until pt able to do labs and f/up

## 2019-04-19 NOTE — Telephone Encounter (Signed)
Refill request for general medication.  Last office visit:11/19/18  No follow-ups on file.  Ref Range & Units 1 yr ago 2 yr ago   Cholesterol <200 mg/dL 182  198   HDL >50 mg/dL 36Low   39Low    Triglycerides <150 mg/dL 302High   307High    Comment: .  If a non-fasting specimen was collected, consider  repeat triglyceride testing on a fasting specimen  if clinically indicated.  Yates Decamp et al. J. of Clin. Lipidol. [id number].  Marland Kitchen   LDL Cholesterol (Calc) mg/dL (calc) 106High     Comment: Reference range: <100

## 2019-04-22 NOTE — Telephone Encounter (Signed)
Pt is scheduled °

## 2019-05-08 ENCOUNTER — Encounter: Payer: Self-pay | Admitting: Family Medicine

## 2019-05-08 ENCOUNTER — Other Ambulatory Visit: Payer: Self-pay

## 2019-05-08 ENCOUNTER — Ambulatory Visit (INDEPENDENT_AMBULATORY_CARE_PROVIDER_SITE_OTHER): Payer: Medicare HMO | Admitting: Family Medicine

## 2019-05-08 VITALS — BP 118/64 | HR 74 | Temp 97.9°F | Resp 14 | Ht 60.0 in | Wt 143.4 lb

## 2019-05-08 DIAGNOSIS — R0609 Other forms of dyspnea: Secondary | ICD-10-CM

## 2019-05-08 DIAGNOSIS — I10 Essential (primary) hypertension: Secondary | ICD-10-CM

## 2019-05-08 DIAGNOSIS — I340 Nonrheumatic mitral (valve) insufficiency: Secondary | ICD-10-CM | POA: Diagnosis not present

## 2019-05-08 DIAGNOSIS — I350 Nonrheumatic aortic (valve) stenosis: Secondary | ICD-10-CM

## 2019-05-08 DIAGNOSIS — E785 Hyperlipidemia, unspecified: Secondary | ICD-10-CM

## 2019-05-08 DIAGNOSIS — Z5181 Encounter for therapeutic drug level monitoring: Secondary | ICD-10-CM

## 2019-05-08 DIAGNOSIS — H9190 Unspecified hearing loss, unspecified ear: Secondary | ICD-10-CM

## 2019-05-08 DIAGNOSIS — Z8673 Personal history of transient ischemic attack (TIA), and cerebral infarction without residual deficits: Secondary | ICD-10-CM

## 2019-05-08 DIAGNOSIS — I4891 Unspecified atrial fibrillation: Secondary | ICD-10-CM

## 2019-05-08 DIAGNOSIS — I25118 Atherosclerotic heart disease of native coronary artery with other forms of angina pectoris: Secondary | ICD-10-CM

## 2019-05-08 DIAGNOSIS — K219 Gastro-esophageal reflux disease without esophagitis: Secondary | ICD-10-CM

## 2019-05-08 DIAGNOSIS — R471 Dysarthria and anarthria: Secondary | ICD-10-CM | POA: Insufficient documentation

## 2019-05-08 DIAGNOSIS — F331 Major depressive disorder, recurrent, moderate: Secondary | ICD-10-CM

## 2019-05-08 DIAGNOSIS — R55 Syncope and collapse: Secondary | ICD-10-CM

## 2019-05-08 DIAGNOSIS — Z955 Presence of coronary angioplasty implant and graft: Secondary | ICD-10-CM | POA: Insufficient documentation

## 2019-05-08 DIAGNOSIS — R7303 Prediabetes: Secondary | ICD-10-CM

## 2019-05-08 DIAGNOSIS — R0600 Dyspnea, unspecified: Secondary | ICD-10-CM

## 2019-05-08 MED ORDER — ATORVASTATIN CALCIUM 80 MG PO TABS: 80.0000 mg | ORAL_TABLET | Freq: Every day | ORAL | 3 refills | Status: DC

## 2019-05-08 MED ORDER — FAMOTIDINE 20 MG PO TABS: 20.0000 mg | ORAL_TABLET | Freq: Two times a day (BID) | ORAL | 3 refills | Status: DC | PRN

## 2019-05-08 MED ORDER — METOPROLOL SUCCINATE ER 50 MG PO TB24: 50.0000 mg | ORAL_TABLET | Freq: Every day | ORAL | 3 refills | Status: DC

## 2019-05-08 MED ORDER — FLUOXETINE HCL 40 MG PO CAPS: 40.0000 mg | ORAL_CAPSULE | Freq: Every day | ORAL | 3 refills | Status: DC

## 2019-05-08 MED ORDER — EZETIMIBE 10 MG PO TABS: 10.0000 mg | ORAL_TABLET | Freq: Every day | ORAL | 11 refills | Status: DC

## 2019-05-08 NOTE — Progress Notes (Signed)
Name: Samantha [name]   MRN: [id number]    DOB: [birth year]   Date:05/08/2019       Progress Note  Chief Complaint  Patient presents with  . Follow-up  . Hyperlipidemia  . Depression  . Hypertension  . Medication Refill     Subjective:   Samantha [name] is a 86 y.o. female, presents to clinic for routine follow up on the conditions listed above.  Here for f/up, no labs for over a year, pt new to me, her PCP retired last year.   She is here with her sister, Julio Alm, sister states pt is [facility] and she comes to help out at visits.   Hypertension:  Currently managed on lisinopril-HCTZ imdur and metoprolol  Pt reports good med compliance and denies any SE.  Blood pressure today is well controlled. BP Readings from Last 3 Encounters:  05/08/19 122/70  02/27/18 120/76  09/11/17 132/64  Pt endorses exertional sx/SOB/fatigue/near syncope Pt denies CP, LE edema, palpitation, Ha's, visual disturbances, orthopnea, PND, weight changes Dietary efforts for BP?  Healthy/dash  Sees cardiology, has afib, rate controlled on metoprolol 100 mg, eliquis for stroke prevention, takes 5 mg BID, hx of 2 strokes, CAD with multiple stents, and on imdur for further medical management. Pt has appt in June in person with Cardiology Per chart review she has history of mild aortic stenosis and mitral regurg Last EKG that I can see was in 2016 when patient had SVT prior to that was sinus tach with ischemic changes and further cardiac management has been through [facility] cardiology where I cannot see or review the EKGs myself but can review and notes the EKG interpretation echo was 2016 LVEF 55% with mild mitral and tricuspid regurg, lexi scan 10/21/2014 LV EF 80% w/o scar or ischemia, hx noted with last in person cardiology visit 12/2017 that pt has history of fatigue and poor exercise tolerance.  08/19/2016 ECHO reviewed:  LVEF 55 to 65% mild aortic valve stenosis and mild mitral valve  regurgitation Other cardiac hx: 1. Taxis stent mid LAD July, 2006 2. Cypher stent proximal RCA 02/11/2005 3. Cypher stent ostium RCA 05/03/2006 4. Essential hypertension 5. Hyperlipidemia 6. Paroxysmal atrial fibrillation 7. CVA 05/2009 A. fib appears to be documented by cardiology as chronic A. fib and sometimes proximal A. Fib Afib - onset in 2016 - was admitted to [facility] for paroxymal afib - ASA and clopidogrel were stopped and pt was started on apixaban and metoprolol for rate and rhythm control - looks like she has been on same metoprolol dose since that time.  I cannot find an EKG since 2016, first visit with Dr. Saralyn Pilar after afib dx and hospitalization was 06/10/2014 -patient was on metoprolol 100 mg extended release at that time and had no concerns complaints or exertional symptoms or side effects.  Next office visit was October 02, 2014 she presented with recurrent episodes of chest pain and complaint of chronic exertional dyspnea at that time she was active going on walks every day.  Cardiologist did echo and nuclear medicine stress test, follow-up was on October 27, 2014, patient continued to complain of exertional shortness of breath stating her activity was limited to only about 15 minutes before she came severely symptomatic.  Cardiac testing was normal and no medications were changed-  she continued Eliquis, Imdur, metoprolol, lisinopril, hydrochlorothiazide, atorvastatin  Hyperlipidemia: Current Medication Regimen:  Put on zetia last year by Dr. Sanda Klein Jan 2020, but pt is not on, and she  doesn't believe she has taken it for a long time.  Lipitor 80 mg daily at bedtime, good compliance, no myalgias Last Lipids: Lab Results  Component Value Date   CHOL 187 02/27/2018   HDL 33 (L) 02/27/2018   LDLCALC 124 (H) 02/27/2018   TRIG 182 (H) 02/27/2018   CHOLHDL 5.7 (H) 02/27/2018   - Denies: Chest pain, myalgias. - Risk factors for atherosclerosis: arteriosclerotic heart disease,  cerebral vascular disease, hypercholesterolemia and hypertension  She is sleepy all the time, if she does any activity she has to stop and go lay down and rest.    MDD:   Worsening with grief and possibly adjustment rxn? Still on prozac 20 mg she has lost 2 children in the past year, she is more depressed and states she is "not a happy person".  She reports lack of desire to do anything she does not want to clean her house or go anywhere or do anything.  She does not want to talk to a grief counselor she states "there is nothing to do about it so why bother" she says "nothing is going to bring them back".  It sounds like she is also moved recently from a bigger home to a smaller home and she is having trouble organizing her stuff belongings which does not fit into the house but she does not want to get rid of anything either.  She says everything is a mess, she is embarassed, but doesn't want to clean anything up either.  She doesn't want to talk to a grief counselor, she had discussed this with someone in the past year, but she still does not want any referrals. She is used to being very active and busy, and now she does nothing and doesn't want to do anything. On prozac 20 mg, chart previously states she was on trintellix?    PHQ reviewed, score positive, increased from prior visits.  I did see many years ago her PCP put her on higher prozac dose 40 mg and she did well with that.  Dr. Sanda Klein had treated with lexapro and trintellex in the past. Depression screen [facility] 2/9 05/08/2019 11/19/2018 02/27/2018  Decreased Interest 1 0 1  Down, Depressed, Hopeless 3 0 -  PHQ - 2 Score 4 0 1  Altered sleeping 1 0 -  Tired, decreased energy 3 0 -  Change in appetite 1 0 -  Feeling bad or failure about yourself  0 0 -  Trouble concentrating 0 0 -  Moving slowly or fidgety/restless 0 0 -  Suicidal thoughts 0 0 -  PHQ-9 Score 9 0 -  Difficult doing work/chores Somewhat difficult Not difficult at all -  she feels  severly depressed and she doesn't want to talk to anyone doesn't want to do anything   Fatigue/exertional sx - not new but much worse over the past two months Patient denies any weight changes, she denies palpitations, orthopnea, PND, lower extremity edema.  She denies any current chest pain at rest or with exertion.  She does state that she just feels like she is going to give out feels extremely exhausted and has to go lay down with minimal activity like bringing the groceries in from the car.  She states that she tries to sweep a little bit very quickly she becomes exhausted after sit down and rest.  She denies any syncopal episode she does endorse shortness of breath with exertion.    Prediabetes - she denies any appetite changes, polyuria, polydipsia,  unintentional weight loss, neuropathy, vision changes.    Patient Active Problem List   Diagnosis Date Noted  . Dysarthria 05/08/2019  . Prediabetes 05/08/2019  . History of placement of stent in LAD coronary artery 05/08/2019  . S/P right coronary artery (RCA) stent placement 05/08/2019  . Moderate episode of recurrent major depressive disorder ([place]) 11/19/2018  . History of ischemic stroke 11/19/2018  . Osteoporosis 03/21/2017  . Mild aortic stenosis 08/22/2016  . Mitral regurgitation 08/22/2016  . Dysphonia 08/16/2016  . Dyslipidemia 08/16/2016  . GERD (gastroesophageal reflux disease) 11/24/2015  . Fatigue 09/22/2015  . HTN (hypertension) 06/07/2014  . Cerebral infarction ([place]) 06/07/2014  . A-fib ([place]) 06/07/2014  . CAD (coronary artery disease) 06/07/2014    Past Surgical History:  Procedure Laterality Date  . ABDOMINAL HYSTERECTOMY    . APPENDECTOMY    . CHOLECYSTECTOMY    . FRACTURE SURGERY      Family History  Problem Relation Age of Onset  . Stroke Mother   . Heart attack Father   . Heart disease Sister   . Diabetes Sister   . Heart disease Brother   . Heart attack Brother   . Diabetes Maternal Grandfather    . Ovarian cancer Paternal Grandmother   . Cancer Brother        bladder cancer  . Diabetes Brother   . Heart disease Brother   . Hypertension Brother   . Heart attack Daughter     Social History   Tobacco Use  . Smoking status: Never Smoker  . Smokeless tobacco: Never Used  Substance Use Topics  . Alcohol use: No  . Drug use: No      Current Outpatient Medications:  .  albuterol (PROVENTIL HFA;VENTOLIN HFA) 108 (90 BASE) MCG/ACT inhaler, Inhale 1-2 puffs into the lungs every 6 (six) hours as needed for wheezing or shortness of breath (pneumonia)., Disp: , Rfl:  .  apixaban (ELIQUIS) 5 MG TABS tablet, Take 1 tablet (5 mg total) by mouth 2 (two) times daily., Disp: 60 tablet, Rfl: 0 .  atorvastatin (LIPITOR) 40 MG tablet, Take 2 tablets (80 mg total) by mouth at bedtime., Disp: 60 tablet, Rfl: 0 .  Cholecalciferol (VITAMIN D-1000 MAX ST) 1000 units tablet, Take 1,000 Units by mouth daily., Disp: , Rfl:  .  famotidine (PEPCID) 20 MG tablet, Take 1 tablet (20 mg total) by mouth 2 (two) times daily as needed for heartburn or indigestion., Disp: 180 tablet, Rfl: 1 .  FLUoxetine (PROZAC) 20 MG capsule, Take 1 capsule (20 mg total) by mouth daily. (call doctor with any change in mood), Disp: 30 capsule, Rfl: 5 .  isosorbide mononitrate (IMDUR) 60 MG 24 hr tablet, Take 1 tablet (60 mg total) by mouth daily., Disp: 90 tablet, Rfl: 1 .  lisinopril-hydrochlorothiazide (ZESTORETIC) 20-12.5 MG tablet, Take 1 tablet by mouth daily., Disp: 90 tablet, Rfl: 1 .  metoprolol succinate (TOPROL-XL) 100 MG 24 hr tablet, Take 1 tablet (100 mg total) by mouth daily. Take with or immediately following a meal. Appointment needed for further refills, Disp: 30 tablet, Rfl: 0 .  Multiple Vitamin (MULTIVITAMIN) tablet, Take 1 tablet by mouth daily., Disp: , Rfl:  .  ezetimibe (ZETIA) 10 MG tablet, Take 1 tablet (10 mg total) by mouth daily. (Patient not taking: Reported on 05/08/2019), Disp: 30 tablet, Rfl: 11 .   Melatonin 3 MG SUBL, Place 1 tablet under the tongue daily., Disp: , Rfl:   Allergies  Allergen Reactions  . Iodine Nausea  And Vomiting  . Levaquin [Levofloxacin] Other (See Comments)    Pt states she have nose bleeds and pain in arms and shoulders.    Chart Review Today: I personally reviewed active problem list, medication list, allergies, family history, social history, health maintenance, notes from last encounter, lab results, imaging with the patient/caregiver today.  Review of Systems  Constitutional: Positive for activity change and fatigue. Negative for appetite change, chills, diaphoresis, fever and unexpected weight change.  HENT: Negative.   Eyes: Negative.   Respiratory: Positive for shortness of breath. Negative for apnea, cough, choking, chest tightness, wheezing and stridor.   Cardiovascular: Negative for chest pain, palpitations and leg swelling.  Gastrointestinal: Negative.  Negative for abdominal pain, blood in stool, constipation and nausea.  Endocrine: Negative.  Negative for cold intolerance, heat intolerance, polydipsia, polyphagia and polyuria.  Genitourinary: Negative.   Musculoskeletal: Negative.   Skin: Negative.  Negative for color change and pallor.  Neurological: Negative for dizziness and headaches.  Hematological: Negative.  Negative for adenopathy. Does not bruise/bleed easily.  Psychiatric/Behavioral: Positive for dysphoric mood. Negative for suicidal ideas.      Objective:    Vitals:   05/08/19 1138  BP: 122/70  Pulse: 73  Resp: 14  Temp: 97.9 F (36.6 C)  SpO2: 98%  Weight: 143 lb 6.4 oz (65 kg)  Height: 5' (1.524 m)    Body mass index is 28.01 kg/m.  Physical Exam Vitals and nursing note reviewed.  Constitutional:      General: She is not in acute distress.    Appearance: Normal appearance. She is well-developed and overweight. She is not ill-appearing, toxic-appearing or diaphoretic.     Interventions: Face mask in place.   HENT:     Head: Normocephalic and atraumatic.     Comments: HOH    Right Ear: External ear normal.     Left Ear: External ear normal.  Eyes:     General: Lids are normal. No scleral icterus.       Right eye: No discharge.        Left eye: No discharge.     Conjunctiva/sclera: Conjunctivae normal.  Neck:     Trachea: Phonation normal. No tracheal deviation.  Cardiovascular:     Rate and Rhythm: Normal rate. Rhythm irregularly irregular.     Pulses: Normal pulses.          Radial pulses are 2+ on the right side and 2+ on the left side.       Posterior tibial pulses are 2+ on the right side and 2+ on the left side.     Heart sounds: Normal heart sounds. No murmur. No friction rub. No gallop.   Pulmonary:     Effort: Pulmonary effort is normal. No tachypnea, accessory muscle usage or respiratory distress.     Breath sounds: Normal breath sounds. No stridor. No wheezing, rhonchi or rales.  Chest:     Chest wall: No tenderness.  Abdominal:     General: Bowel sounds are normal. There is no distension.     Palpations: Abdomen is soft.     Tenderness: There is no abdominal tenderness. There is no guarding or rebound.  Musculoskeletal:        General: No deformity. Normal range of motion.     Cervical back: Normal range of motion and neck supple.     Right lower leg: No edema.     Left lower leg: No edema.  Lymphadenopathy:     Cervical: No  cervical adenopathy.  Skin:    General: Skin is warm and dry.     Capillary Refill: Capillary refill takes less than 2 seconds.     Coloration: Skin is not jaundiced or pale.     Findings: No rash.  Neurological:     Mental Status: She is alert and oriented to person, place, and time.     Motor: No abnormal muscle tone.     Gait: Gait normal.  Psychiatric:        Speech: Speech normal.        Behavior: Behavior normal. Behavior is cooperative.       Pt was ambulated around clinic no compliant of sx HR at rest prior to walking was 72,  increased to 74 with ambulation and BP repeated after was 118/64   PHQ2/9: Depression screen [facility] 2/9 05/08/2019 11/19/2018 02/27/2018 02/27/2018 02/27/2018  Decreased Interest 1 0 1 2 -  Down, Depressed, Hopeless 3 0 - 3 3  PHQ - 2 Score 4 0 1 5 3   Altered sleeping 1 0 - 1 1  Tired, decreased energy 3 0 - 3 3  Change in appetite 1 0 - 0 0  Feeling bad or failure about yourself  0 0 - 1 1  Trouble concentrating 0 0 - 1 1  Moving slowly or fidgety/restless 0 0 - 0 0  Suicidal thoughts 0 0 - 0 0  PHQ-9 Score 9 0 - 11 9  Difficult doing work/chores Somewhat difficult Not difficult at all - Somewhat difficult Somewhat difficult    phq 9 is positive - see A&P  Fall Risk: Fall Risk  05/08/2019 11/19/2018 02/27/2018 09/11/2017 03/21/2017  Falls in the past year? 0 0 0 Yes Yes  Number falls in past yr: 0 0 0 1 2 or more  Injury with Fall? 0 0 0 Yes Yes  Comment - - - - broke right arm  Risk Factor Category  - - - - -  Risk for fall due to : Other (Comment) - - - -  Follow up Falls evaluation completed Falls evaluation completed - - -    Functional Status Survey: Is the patient deaf or have difficulty hearing?: Yes Does the patient have difficulty seeing, even when wearing glasses/contacts?: No Does the patient have difficulty concentrating, remembering, or making decisions?: No Does the patient have difficulty walking or climbing stairs?: No Does the patient have difficulty dressing or bathing?: No Does the patient have difficulty doing errands alone such as visiting a doctor's office or shopping?: No   Assessment & Plan:     ICD-10-CM   1. Essential hypertension  99991111 COMPLETE METABOLIC PANEL WITH GFR   well controlled today on 3 antihypertensives, not currently monitoring at home, severe sx with mild exertion at home, asked her to monitor, close f/up     2. Mild aortic stenosis  I35.0    Keep follow-up with cardiology worse fatigue symptoms may be secondary to worsening valvular  pathology?   3. Mitral valve insufficiency, unspecified etiology  I34.0    Keep follow-up with cardiology   4. Coronary artery disease involving native coronary artery of native heart with other form of angina pectoris (HCC)  123456 COMPLETE METABOLIC PANEL WITH GFR    Lipid panel    ezetimibe (ZETIA) 10 MG tablet   poor exercise/exertional tolerance, hx of this with worsening exertional fatigue, like shes going to give out, SOB, no CP - f/up cardiology   5. Atrial fibrillation, unspecified type (  [place])  I48.91    rate controlled with metoprolol XL 100 mg - since 2016, HR in 70's today, no palpitations, no sx of CHF, possible trial of decrease BB dose for exertional sx?     6. Gastroesophageal reflux disease, unspecified whether esophagitis present  K21.9 famotidine (PEPCID) 20 MG tablet   well controlled with pepcid   7. History of ischemic stroke  Z86.73    Some difficulty with speech has done speech therapy before on Eliquis for stroke prevention   8. Dyslipidemia  99991111 COMPLETE METABOLIC PANEL WITH GFR    Lipid panel    ezetimibe (ZETIA) 10 MG tablet   Last labs show poorly controlled lipids on atorvastatin, PCP added Zetia but patient is not taking we will recheck labs today, no myalgias   9. Moderate episode of recurrent major depressive disorder (HCC)  F33.1 FLUoxetine (PROZAC) 40 MG capsule   worsening depression, very sx, unclear if this is a factor in fatigue sx, recent death in family, covid, moved, isolated, less active She refuses psych/counsel Plan to increase prozac dose to 40 mg and f/up closely Encouraged her to not worry about her home being tidy, encouraged her to reach out if and when she would like to see a specialist or be referred to grief counseling.   10. Exertional dyspnea  R06.00    Patient is complain of the symptoms since 2016 after being diagnosed and treated for A. fib -cardiac testing showed no ischemia. med SE?    11. Near syncope  R55     exertional fatigue, described as "going to give out, just exhausted" with minimal activity, walking groceries into house or sweeping porch   12. Prediabetes  [id number] COMPLETE METABOLIC PANEL WITH GFR    Lipid panel    Hemoglobin A1c   no sx of hyperglycemia, but pt has worsening fatigue, recheck a1c   13. Dysarthria  R47.1    secondary to past CVA   14. History of placement of stent in LAD coronary artery  Z95.5   15. S/P right coronary artery (RCA) stent placement  Z95.5   16. Hearing loss, unspecified hearing loss type, unspecified laterality  H91.90   17. Encounter for medication monitoring  Z51.81 CBC with Differential/Platelet    COMPLETE METABOLIC PANEL WITH GFR    Lipid panel    Hemoglobin A1c    After reviewing pts chart thoroughly - through our EMR and through care everywhere back more than 7 years - I will change pt's metoprolol from 100 mg XL daily to 50 mg XL daily and see if she has any improvement in tolerating mild physical activity. Sx were first reported in Sept 2016, and in May 2016 she was diagnosed with afib and started on metoprolol 100 mg and no other doses were tried.  Some bradycardia in 2016 and then intermittently, I reviewed vitals flow sheet going back years and mostly HR 60-70's.  She has not palpitations, I feel her BP and HR are low end of normal and would tolerate and trial of decreased dose.    Pt was also asked during her visit to check her VS when she is sx, and let us know if HR and BP are high or low - and that would help me figure out the cause of her sx.  Otherwise today checking to r/o anemia, electrolyte imbalance. Could possibly be statin SE or hypothyroid - did not check labs for that today because is seems more likely to be related to  depression, grief or medication side effects -if her symptoms do not improve we can check that at her follow-up visit.  Return for 2 month follow up on med dose changes for MDD, routine follow up in 4 month.   Delsa Grana, PA-C 05/08/19 11:48 AM

## 2019-05-08 NOTE — Patient Instructions (Addendum)
I will refill your medicines after I check your labs  If you experience severe symptoms of fatigue, exhaustion, or feel like its hard to breath, or you might pass out - please try and check you blood pressure and heart rate when having symptoms.  It will help [place] figure out the problem.  Normal Heart rate is 60-100 Normal Blood pressure is 100/60- 140/80  Please follow up with cardiology - your other heart conditions will need to be checked on because they can cause symptoms like this too.  We will need to do a closer follow up to check on your medication changes

## 2019-05-09 ENCOUNTER — Other Ambulatory Visit: Payer: Self-pay | Admitting: Family Medicine

## 2019-05-09 DIAGNOSIS — I4891 Unspecified atrial fibrillation: Secondary | ICD-10-CM

## 2019-05-09 DIAGNOSIS — I25111 Atherosclerotic heart disease of native coronary artery with angina pectoris with documented spasm: Secondary | ICD-10-CM

## 2019-05-09 DIAGNOSIS — I10 Essential (primary) hypertension: Secondary | ICD-10-CM

## 2019-05-09 LAB — CBC WITH DIFFERENTIAL/PLATELET
Absolute Monocytes: 660 cells/uL (ref 200–950)
Basophils Absolute: 20 cells/uL (ref 0–200)
Basophils Relative: 0.3 %
Eosinophils Absolute: 218 cells/uL (ref 15–500)
Eosinophils Relative: 3.2 %
HCT: 43.6 % (ref 35.0–45.0)
Hemoglobin: 14.3 g/dL (ref 11.7–15.5)
Lymphs Abs: 2006 cells/uL (ref 850–3900)
MCH: 31 pg (ref 27.0–33.0)
MCHC: 32.8 g/dL (ref 32.0–36.0)
MCV: 94.6 fL (ref 80.0–100.0)
MPV: 10.4 fL (ref 7.5–12.5)
Monocytes Relative: 9.7 %
Neutro Abs: 3896 cells/uL (ref 1500–7800)
Neutrophils Relative %: 57.3 %
Platelets: 280 10*3/uL (ref 140–400)
RBC: 4.61 10*6/uL (ref 3.80–5.10)
RDW: 12.4 % (ref 11.0–15.0)
Total Lymphocyte: 29.5 %
WBC: 6.8 10*3/uL (ref 3.8–10.8)

## 2019-05-09 LAB — COMPLETE METABOLIC PANEL WITH GFR
AG Ratio: 1.5 (calc) (ref 1.0–2.5)
ALT: 21 U/L (ref 6–29)
AST: 24 U/L (ref 10–35)
Albumin: 3.8 g/dL (ref 3.6–5.1)
Alkaline phosphatase (APISO): 87 U/L (ref 37–153)
BUN: 14 mg/dL (ref 7–25)
CO2: 33 mmol/L — ABNORMAL HIGH (ref 20–32)
Calcium: 9.9 mg/dL (ref 8.6–10.4)
Chloride: 101 mmol/L (ref 98–110)
Creat: 0.85 mg/dL (ref 0.60–0.88)
GFR, Est African American: 71 mL/min/{1.73_m2} (ref >=60)
GFR, Est Non African American: 61 mL/min/{1.73_m2} (ref >=60)
Globulin: 2.6 g/dL (calc) (ref 1.9–3.7)
Glucose, Bld: 95 mg/dL (ref 65–99)
Potassium: 3.7 mmol/L (ref 3.5–5.3)
Sodium: 141 mmol/L (ref 135–146)
Total Bilirubin: 0.6 mg/dL (ref 0.2–1.2)
Total Protein: 6.4 g/dL (ref 6.1–8.1)

## 2019-05-09 LAB — HEMOGLOBIN A1C
Hgb A1c MFr Bld: 5.7 % of total Hgb — ABNORMAL HIGH (ref <5.7)
Mean Plasma Glucose: 117 (calc)
eAG (mmol/L): 6.5 (calc)

## 2019-05-09 LAB — LIPID PANEL
Cholesterol: 156 mg/dL (ref <200)
HDL: 35 mg/dL — ABNORMAL LOW (ref >=50)
LDL Cholesterol (Calc): 93 mg/dL (calc)
Non-HDL Cholesterol (Calc): 121 mg/dL (calc) (ref <130)
Total CHOL/HDL Ratio: 4.5 (calc) (ref <5.0)
Triglycerides: 187 mg/dL — ABNORMAL HIGH (ref <150)

## 2019-05-09 MED ORDER — ISOSORBIDE MONONITRATE ER 60 MG PO TB24: 60.0000 mg | ORAL_TABLET | Freq: Every day | ORAL | 3 refills | Status: DC

## 2019-05-09 MED ORDER — LISINOPRIL-HYDROCHLOROTHIAZIDE 20-12.5 MG PO TABS: 1.0000 | ORAL_TABLET | Freq: Every day | ORAL | 3 refills | Status: DC

## 2019-05-30 IMAGING — CR DG KNEE COMPLETE 4+V*R*
4 series · 4 of 4 positions shown · non-contrast
Comparison: None.

CLINICAL DATA: Status post fall days ago.

EXAM:
RIGHT KNEE - COMPLETE 4+ VIEW

[knee ap]
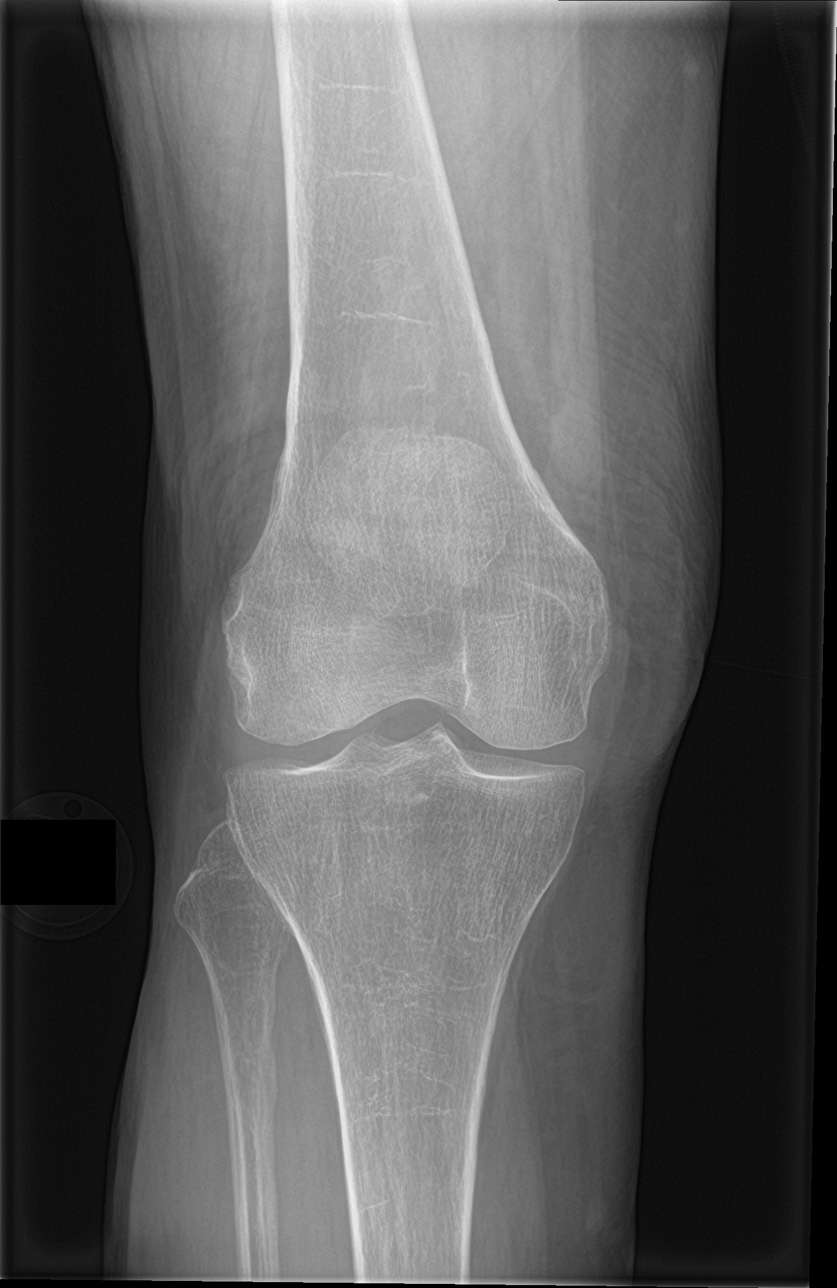

[knee obl (1 of 2)]
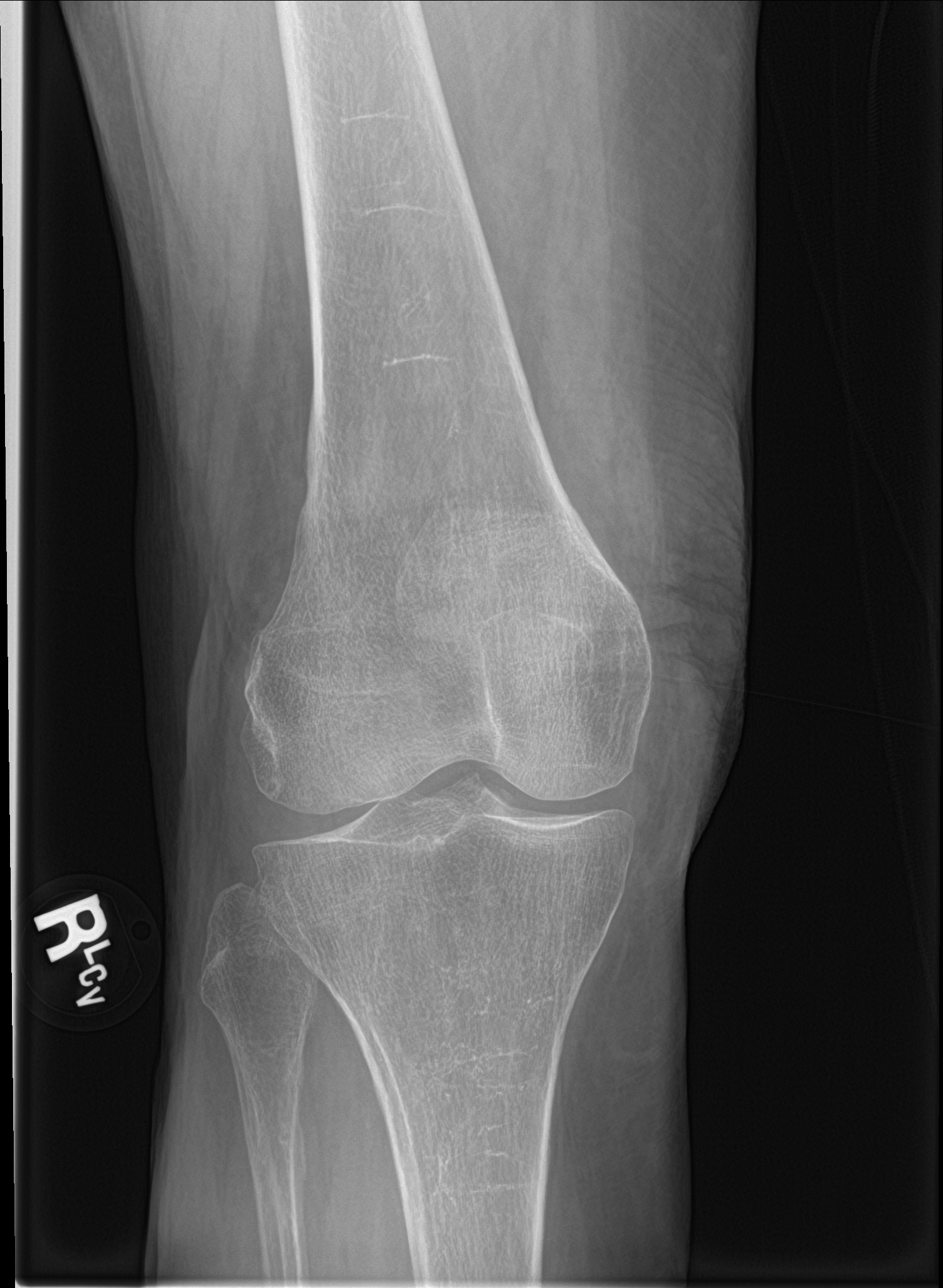

[knee obl (2 of 2)]
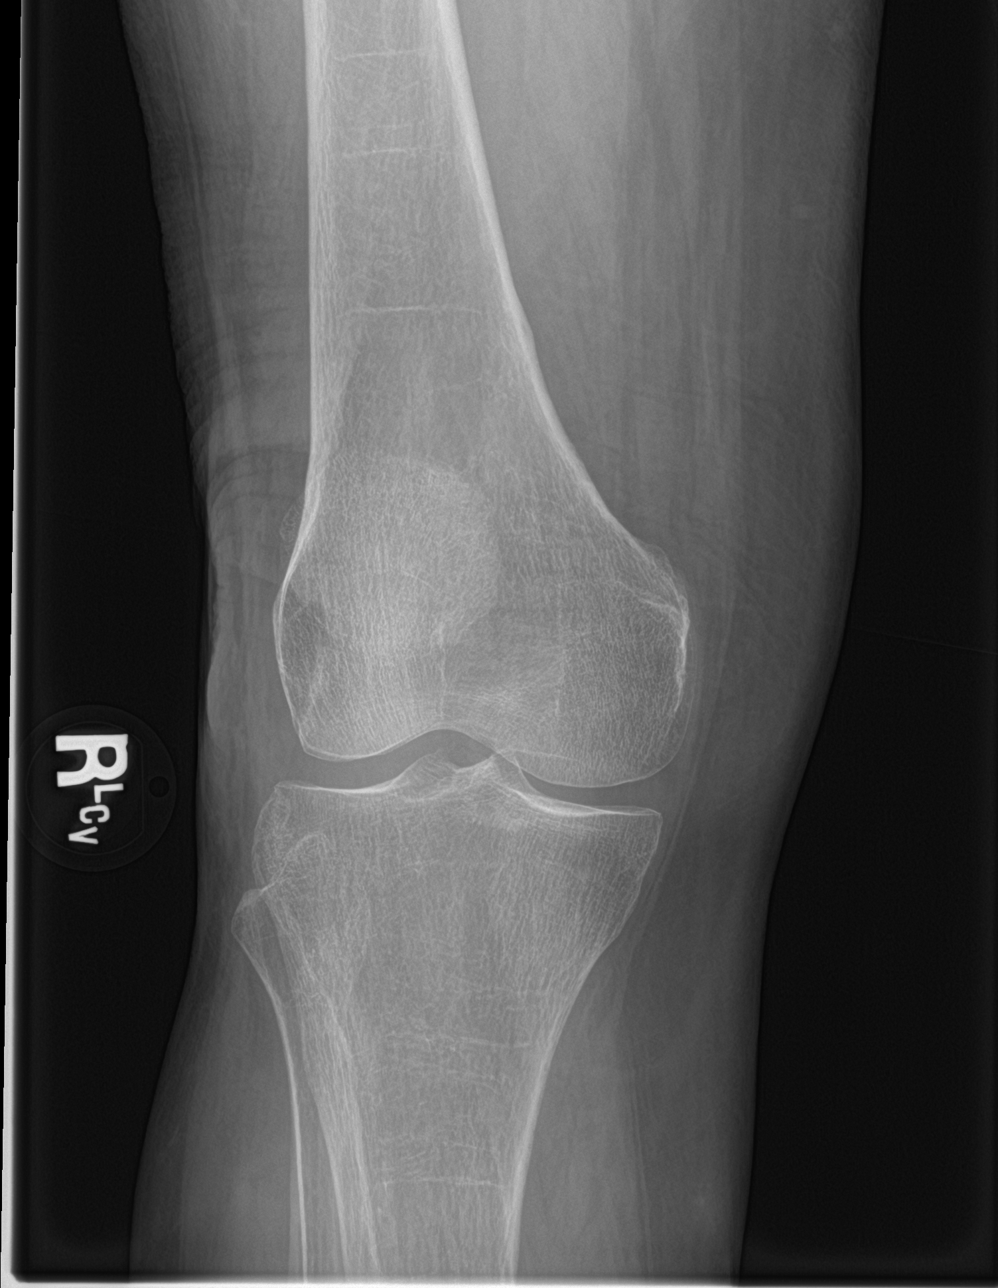

[knee lat]
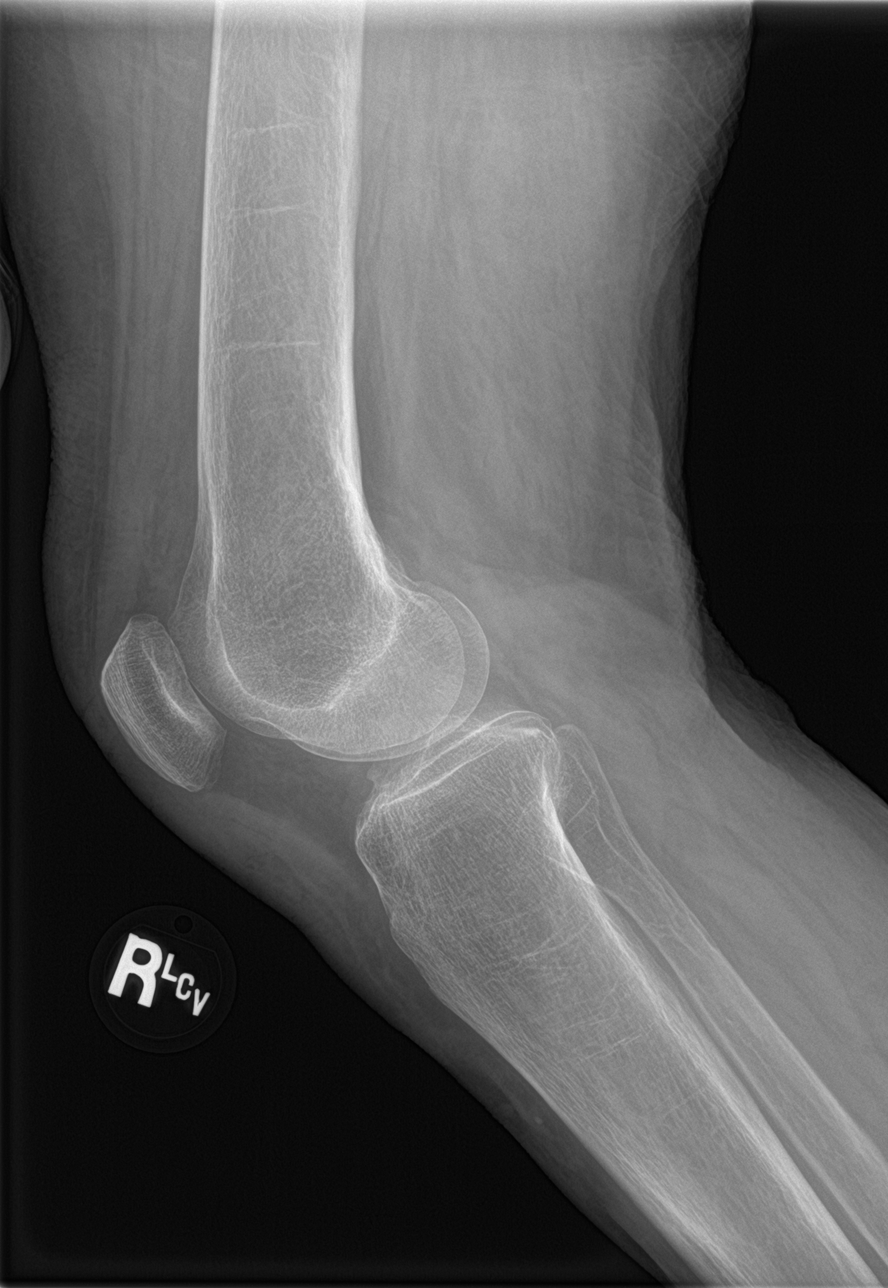

[4 of 4 positions shown; findings below may reference images not displayed]

FINDINGS: Generalized osteopenia. No acute fracture or dislocation. No
aggressive osseous lesion. No significant joint effusion. Mild soft
tissue swelling superficial to the patella.
IMPRESSION: No acute osseous injury of the right knee.

## 2019-05-30 IMAGING — CT CT HEAD W/O CM
3 series · 15 of 46 positions shown, 18 images · non-contrast
Comparison: August 18, 2016

CLINICAL DATA: Fall.  Patient on blood thinners.

EXAM:
CT HEAD WITHOUT CONTRAST
TECHNIQUE: Contiguous axial images were obtained from the base of the skull
through the vertex without intravenous contrast.

[Series 2: head wo · axial · 0.42mm/px · z∈[-63,+57]mm · 9 of 29 slices shown, 12 images]
[im 3/29  brain]
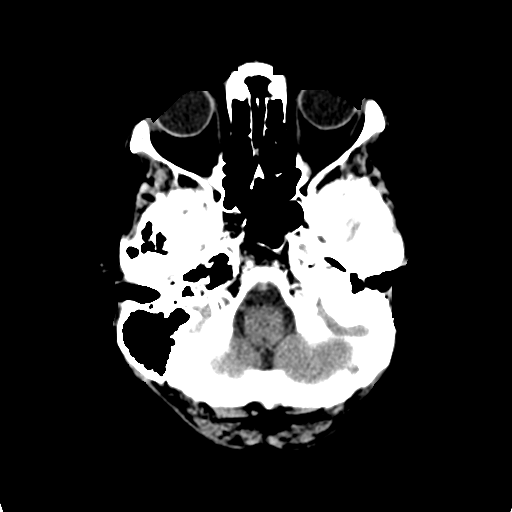
[im 3/29  bone]
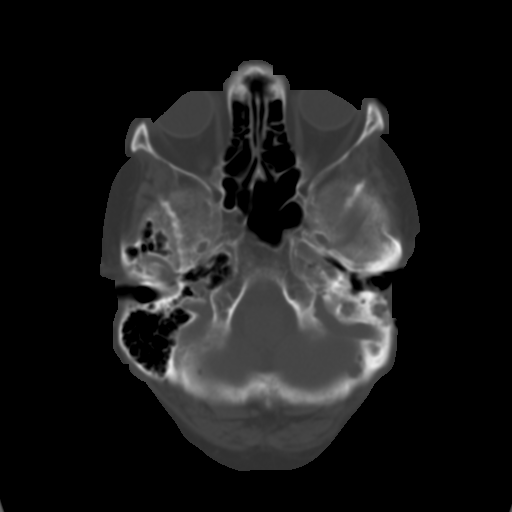
[im 6/29  brain]
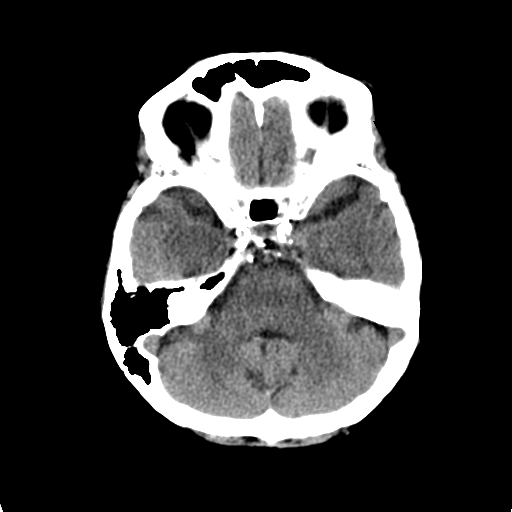
[im 9/29  brain]
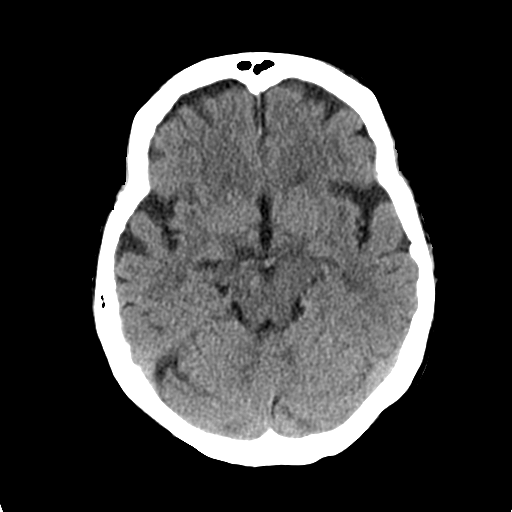
[im 12/29  brain]
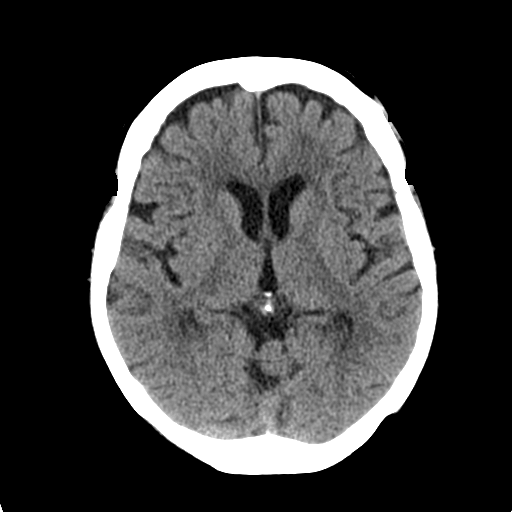
[im 15/29  brain]
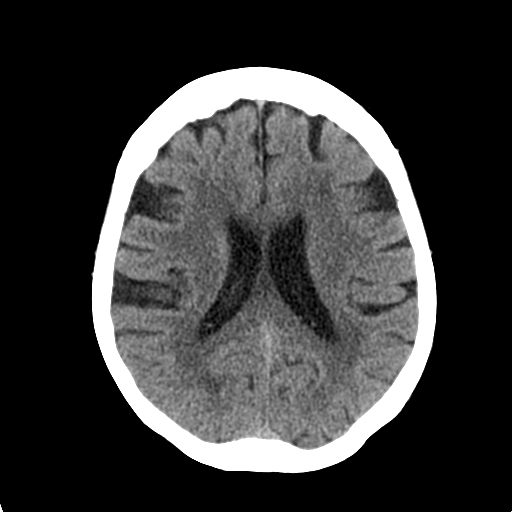
[im 15/29  bone]
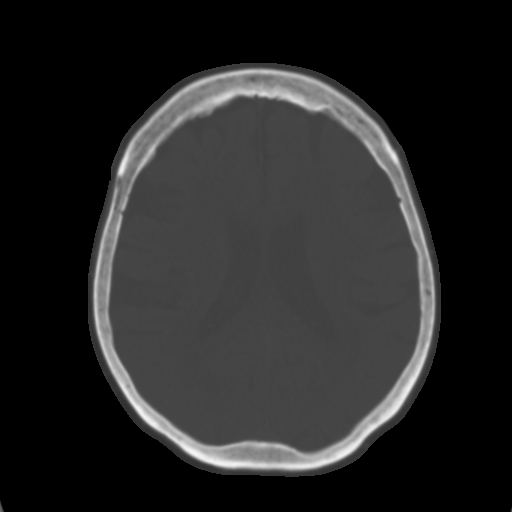
[im 18/29  brain]
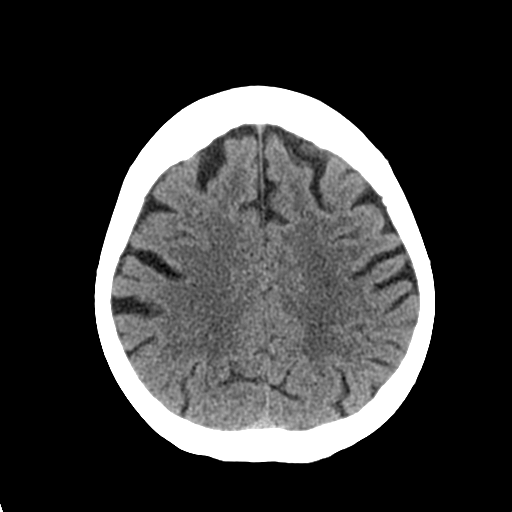
[im 21/29  brain]
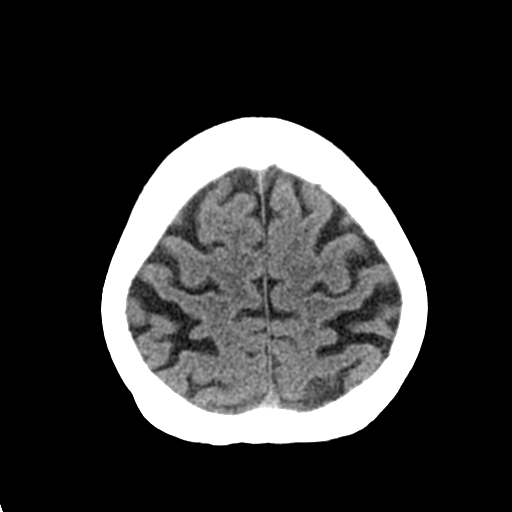
[im 24/29  brain]
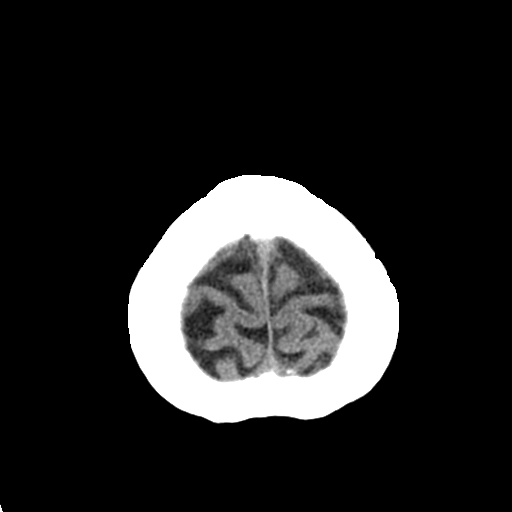
[im 27/29  brain]
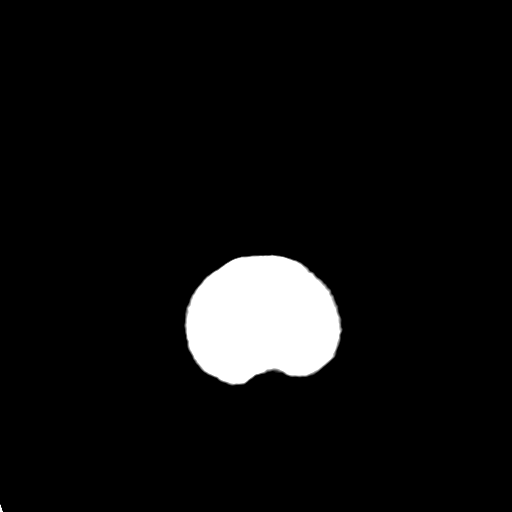
[im 27/29  bone]
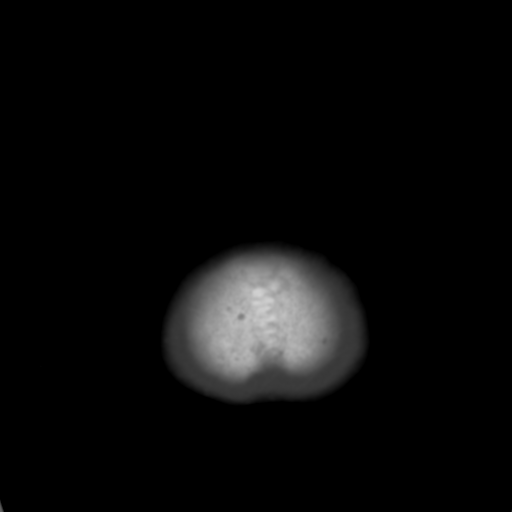

[Series 4: coronal soft tissue · coronal · 0.29mm/px · 3 of 58 slices shown]
[im 20/58  brain]
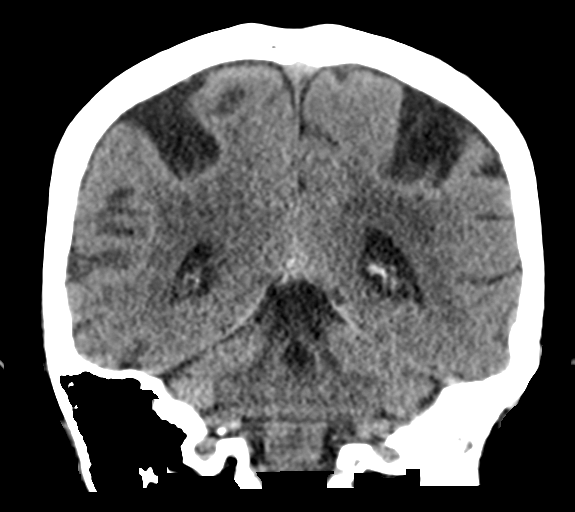
[im 26/58  brain]
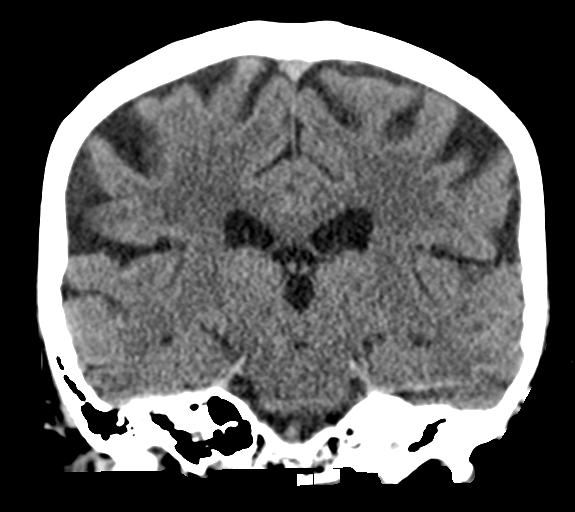
[im 32/58  brain]
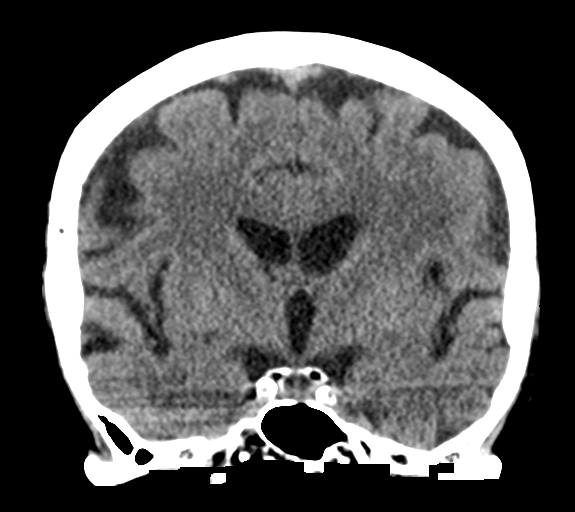

[Series 5: sagittal soft tissue · sagittal · 0.28mm/px · 3 of 48 slices shown]
[im 16/48  brain]
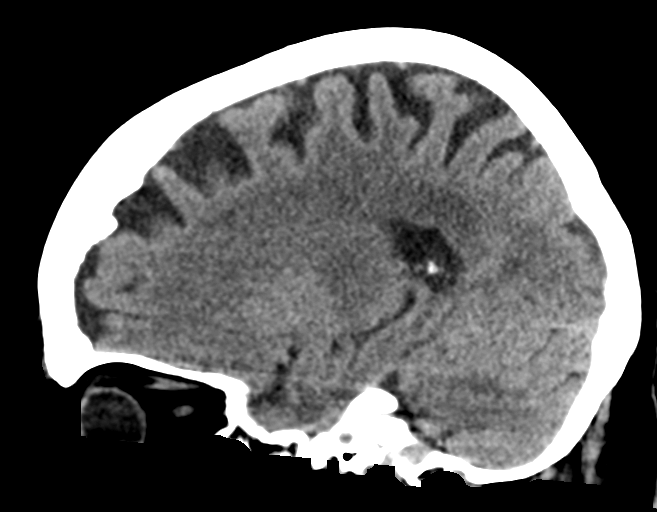
[im 24/48  brain]
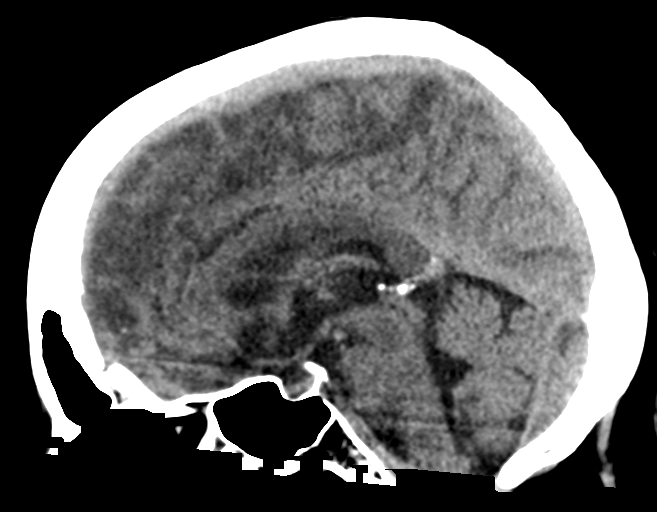
[im 32/48  brain]
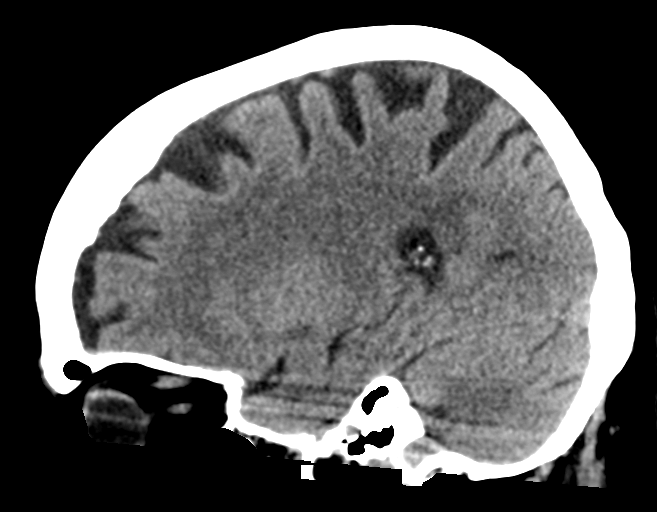

[15 of 46 positions shown; findings below may reference images not displayed]

FINDINGS: Brain: No subdural, epidural, or subarachnoid hemorrhage. Ventricles
and sulci are stable and mildly prominent. Cerebellum, brainstem,
and basal cisterns are normal. Mild white matter changes are stable.
No acute intracranial infarct or ischemia. No midline shift or mass
effect.

Vascular: Calcified atherosclerosis is identified in the
intracranial portions of the carotid arteries.

Skull: Normal. Negative for fracture or focal lesion.

Sinuses/Orbits: Chronic opacification of left mastoid air cells.
Increased opacity in the central portion of the left external
auditory canal. The opacification abuts the ossicles laterally and
there may be involvement of the middle ear. However, there is air
more centrally in the middle ear. The right mastoid air cells and
middle ear are normal. The paranasal sinuses are normal.

Other: None.
IMPRESSION: 1. No acute intracranial abnormalities.
2. Chronic opacification of left mastoid air cells. Increasing
opacification of the external auditory canal on the left.

## 2019-05-30 IMAGING — CR DG WRIST COMPLETE 3+V*R*
4 series · 4 of 4 positions shown · non-contrast
Comparison: None.

CLINICAL DATA: Pain after fall

EXAM:
RIGHT WRIST - COMPLETE 3+ VIEW

[wrist pa]
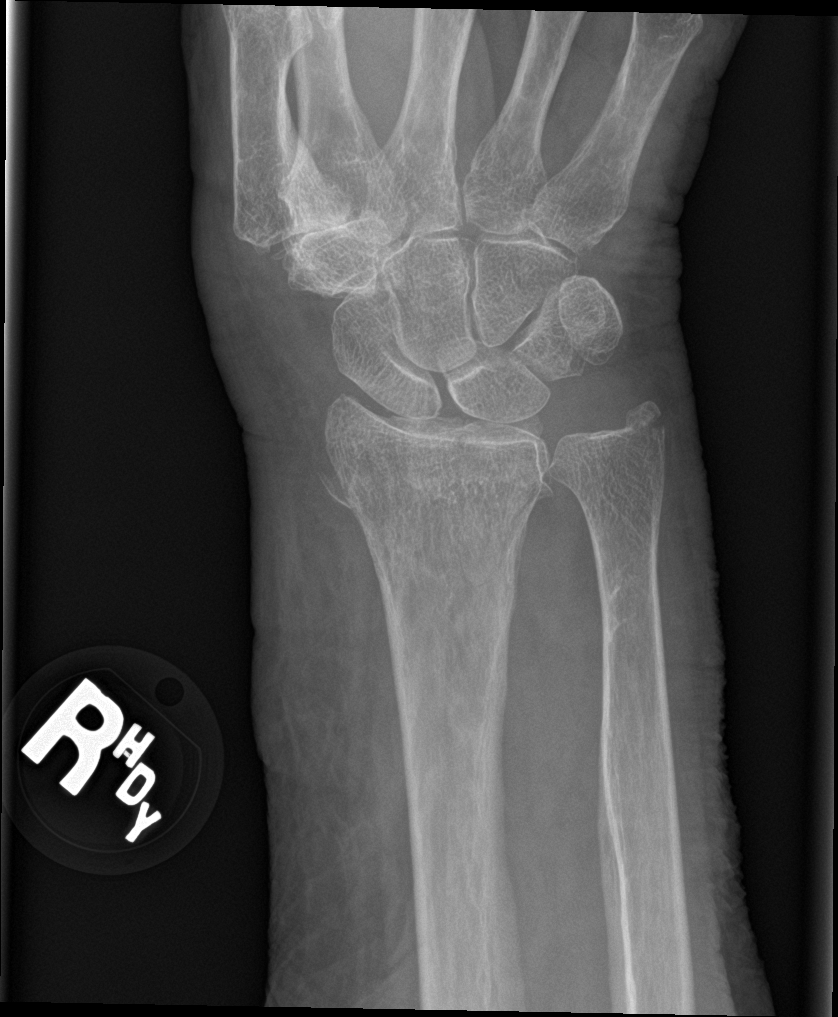

[wrist obl]
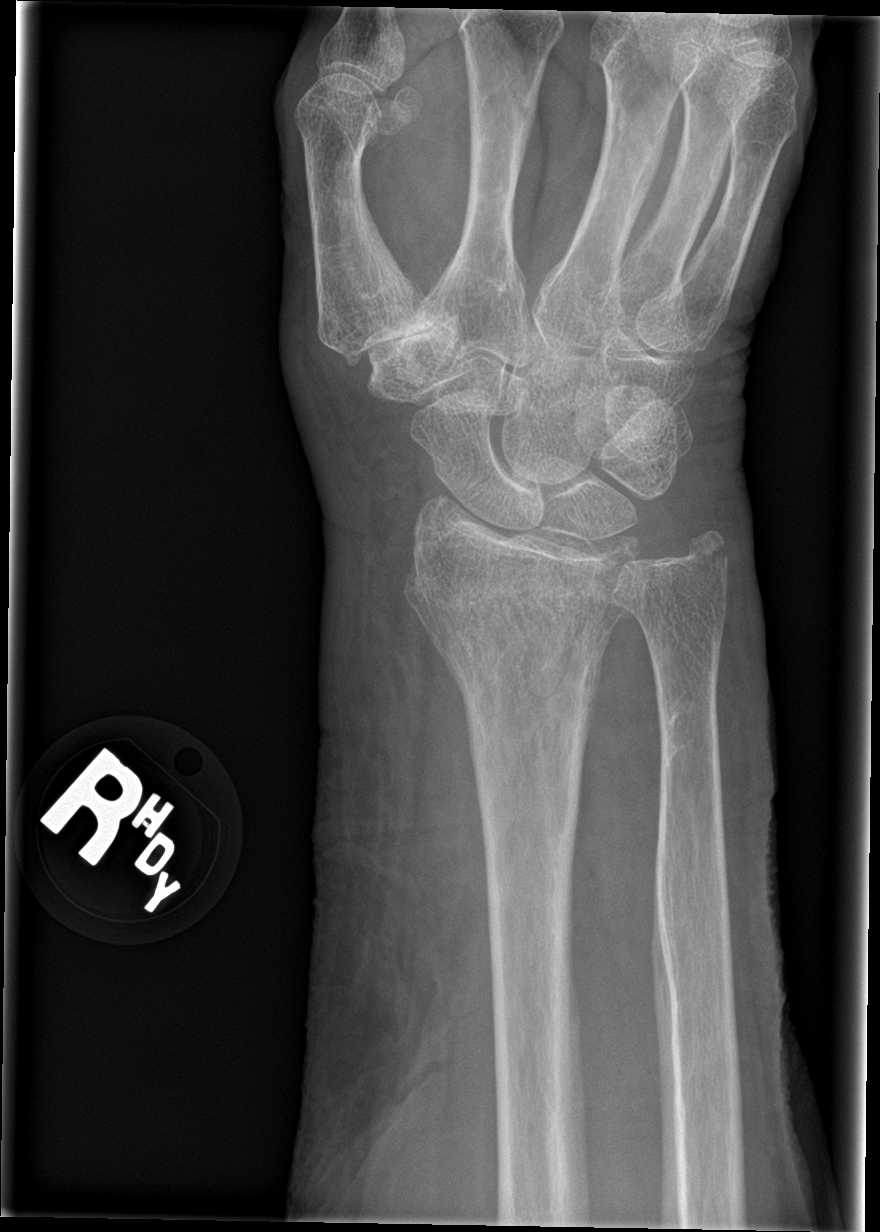

[wrist lat]
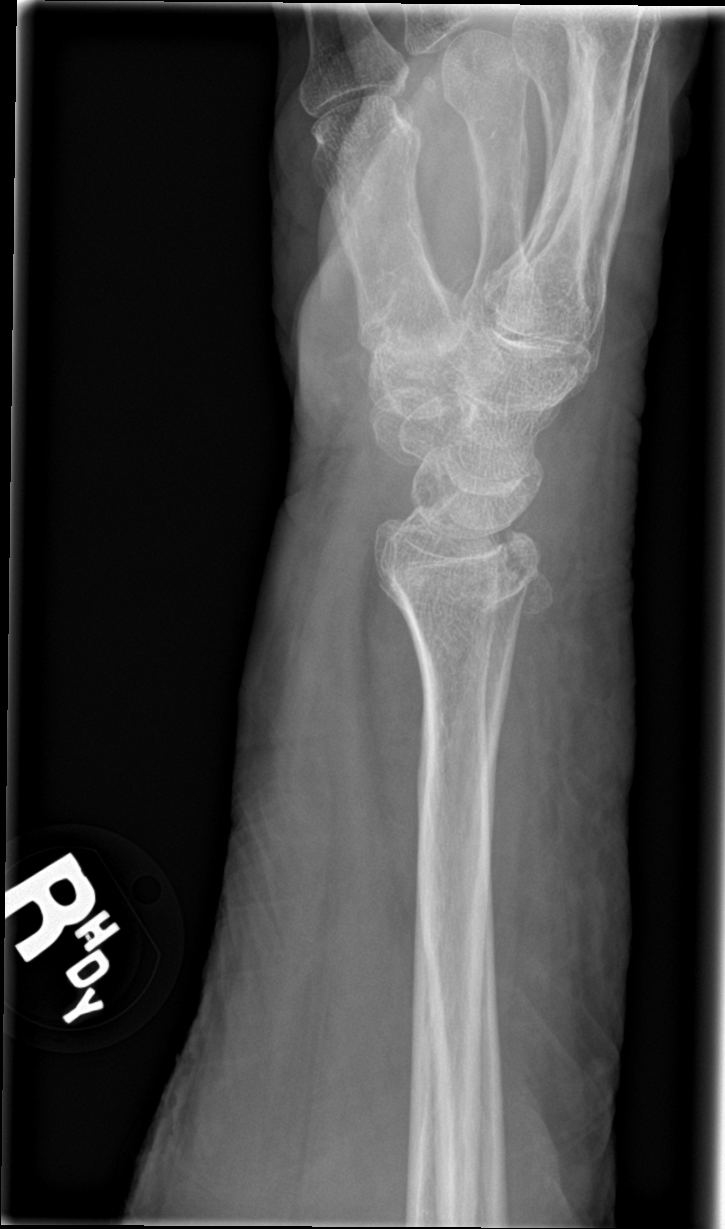

[navicular]
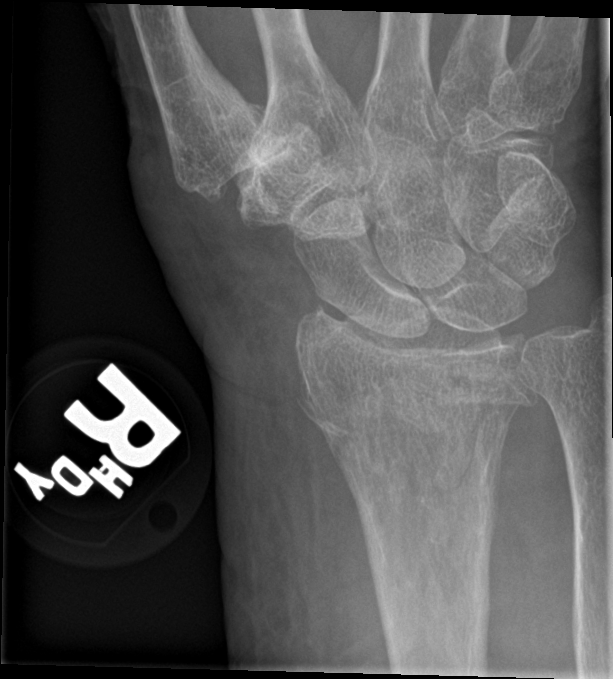

[4 of 4 positions shown; findings below may reference images not displayed]

FINDINGS: There is a fracture of the distal ulnar styloid. There is a
comminuted mildly displaced fracture of the distal radius as well
extending into the radiocarpal joint. No other fractures. No
dislocation.
IMPRESSION: Comminuted mildly displaced fracture of the distal radius extending
into the radiocarpal joint. Ulnar styloid fracture.

## 2019-06-24 ENCOUNTER — Ambulatory Visit
Admission: EM | Admit: 2019-06-24 | Discharge: 2019-06-24 | Disposition: A | Discharge status: Home / Self Care | Payer: Medicare HMO | Attending: Family Medicine | Admitting: Family Medicine

## 2019-06-24 ENCOUNTER — Other Ambulatory Visit: Payer: Self-pay

## 2019-06-24 ENCOUNTER — Encounter: Payer: Self-pay | Admitting: Emergency Medicine

## 2019-06-24 DIAGNOSIS — H6122 Impacted cerumen, left ear: Secondary | ICD-10-CM

## 2019-06-24 DIAGNOSIS — R519 Headache, unspecified: Secondary | ICD-10-CM

## 2019-06-24 NOTE — ED Triage Notes (Addendum)
Patient c/o left side head pain that started Sunday morning. She states the pain is right above her left ear. She reports that her ear is draining black fluid. She states she has never had a pain like this before.

## 2019-06-24 NOTE — ED Provider Notes (Addendum)
[facility]    CSN: [id number] Arrival date & time: 06/24/19  1658      History   Chief Complaint Chief Complaint  Patient presents with  . Headache    left side    HPI Samantha [name] is a 86 y.o. female.   86 yo female with a c/o intermittent left sided severe headaches for the past week and tenderness to her scalp on the left side.  Patient states that the head pain is brief and sharp but severe "10 out of 10". Also reports recent changes to her vision and due to this she went to see her eye doctor last week. Denies any fevers, chills, chest pains, numbness/tingling, unilateral weakness, slurred speech or trouble swallowing.   Patient has a h/o cva.    Headache   Past Medical History:  Diagnosis Date  . A-fib ([place]) 06/07/2014  . Arm fracture 03/10/2017   right  . CAD (coronary artery disease) 06/07/2014  . Closed right radial fracture 03/21/2017  . Depression 09/24/2015  . Dyslipidemia 08/16/2016  . Dysphonia 08/16/2016  . Hypertension   . Mild aortic stenosis 08/22/2016   Echo July 2018  . Mitral regurgitation 08/22/2016   Echo July 2018  . Stroke [facility])     Patient Active Problem List   Diagnosis Date Noted  . Dysarthria 05/08/2019  . Prediabetes 05/08/2019  . History of placement of stent in LAD coronary artery 05/08/2019  . S/P right coronary artery (RCA) stent placement 05/08/2019  . Moderate episode of recurrent major depressive disorder ([place]) 11/19/2018  . History of ischemic stroke 11/19/2018  . Osteoporosis 03/21/2017  . Mild aortic stenosis 08/22/2016  . Mitral regurgitation 08/22/2016  . Dysphonia 08/16/2016  . Dyslipidemia 08/16/2016  . GERD (gastroesophageal reflux disease) 11/24/2015  . Fatigue 09/22/2015  . HTN (hypertension) 06/07/2014  . Cerebral infarction ([place]) 06/07/2014  . A-fib ([place]) 06/07/2014  . CAD (coronary artery disease) 06/07/2014    Past Surgical History:  Procedure Laterality Date  . ABDOMINAL HYSTERECTOMY    .  APPENDECTOMY    . CHOLECYSTECTOMY    . FRACTURE SURGERY      OB History   No obstetric history on file.      Home Medications    Prior to Admission medications   Medication Sig Start Date End Date Taking? Authorizing Provider  apixaban (ELIQUIS) 5 MG TABS tablet Take 1 tablet (5 mg total) by mouth 2 (two) times daily. 06/08/14  Yes Mody, Ulice Bold, MD  atorvastatin (LIPITOR) 80 MG tablet Take 1 tablet (80 mg total) by mouth at bedtime. 05/08/19  Yes Delsa Grana, PA-C  ezetimibe (ZETIA) 10 MG tablet Take 1 tablet (10 mg total) by mouth daily. 05/08/19  Yes Delsa Grana, PA-C  famotidine (PEPCID) 20 MG tablet Take 1 tablet (20 mg total) by mouth 2 (two) times daily as needed for heartburn or indigestion. 05/08/19  Yes Delsa Grana, PA-C  FLUoxetine (PROZAC) 40 MG capsule Take 1 capsule (40 mg total) by mouth daily. 05/08/19  Yes Delsa Grana, PA-C  isosorbide mononitrate (IMDUR) 60 MG 24 hr tablet Take 1 tablet (60 mg total) by mouth daily. 05/09/19  Yes Delsa Grana, PA-C  lisinopril-hydrochlorothiazide (ZESTORETIC) 20-12.5 MG tablet Take 1 tablet by mouth daily. 05/09/19  Yes Delsa Grana, PA-C  Melatonin 3 MG SUBL Place 1 tablet under the tongue daily.   Yes [provider]  metoprolol succinate (TOPROL-XL) 50 MG 24 hr tablet Take 1 tablet (50 mg total) by mouth daily.  Take with or immediately following a meal. 05/08/19  Yes Delsa Grana, PA-C  Multiple Vitamin (MULTIVITAMIN) tablet Take 1 tablet by mouth daily.   Yes [provider]  Cholecalciferol (VITAMIN D-1000 MAX ST) 1000 units tablet Take 1,000 Units by mouth daily.    [provider]    Family History Family History  Problem Relation Age of Onset  . Stroke Mother   . Heart attack Father   . Heart disease Sister   . Diabetes Sister   . Heart disease Brother   . Heart attack Brother   . Diabetes Maternal Grandfather   . Ovarian cancer Paternal Grandmother   . Cancer Brother        bladder cancer  . Diabetes  Brother   . Heart disease Brother   . Hypertension Brother   . Heart attack Daughter     Social History Social History   Tobacco Use  . Smoking status: Never Smoker  . Smokeless tobacco: Never Used  Substance Use Topics  . Alcohol use: No  . Drug use: No     Allergies   Iodine and Levaquin [levofloxacin]   Review of Systems Review of Systems  Neurological: Positive for headaches.     Physical Exam Triage Vital Signs ED Triage Vitals  Enc Vitals Group     BP 06/24/19 1714 (!) 143/69     Pulse Rate 06/24/19 1714 75     Resp 06/24/19 1714 18     Temp 06/24/19 1714 98.1 F (36.7 C)     Temp Source 06/24/19 1714 Oral     SpO2 06/24/19 1714 97 %     Weight 06/24/19 1713 145 lb (65.8 kg)     Height 06/24/19 1713 5' (1.524 m)     Head Circumference --      Peak Flow --      Pain Score 06/24/19 1712 8     Pain Loc --      Pain Edu? --      Excl. in [place]? --    No data found.  Updated Vital Signs BP (!) 143/69 (BP Location: Right Arm)   Pulse 75   Temp 98.1 F (36.7 C) (Oral)   Resp 18   Ht 5' (1.524 m)   Wt 65.8 kg   SpO2 97%   BMI 28.32 kg/m   Visual Acuity Right Eye Distance:   Left Eye Distance:   Bilateral Distance:    Right Eye Near:   Left Eye Near:    Bilateral Near:     Physical Exam Vitals and nursing note reviewed.  Constitutional:      General: She is not in acute distress.    Appearance: She is not toxic-appearing or diaphoretic.  HENT:     Head: Atraumatic.      Comments: Tenderness to palpation over the left temporal scalp area    Right Ear: Tympanic membrane and ear canal normal.     Ears:     Comments: Left ear cerumen impaction Eyes:     Extraocular Movements: Extraocular movements intact.     Conjunctiva/sclera: Conjunctivae normal.     Pupils: Pupils are equal, round, and reactive to light.  Musculoskeletal:     Cervical back: Normal range of motion and neck supple.  Neurological:     General: No focal deficit present.       Mental Status: She is alert and oriented to person, place, and time.     Cranial Nerves: No cranial nerve  deficit.     Sensory: No sensory deficit.     Motor: No weakness.     Coordination: Coordination normal.     Gait: Gait normal.     Deep Tendon Reflexes: Reflexes normal.      UC Treatments / Results  Labs (all labs ordered are listed, but only abnormal results are displayed) Labs Reviewed - No data to display  EKG   Radiology No results found.  Procedures Ear Cerumen Removal  Date/Time: 06/24/2019 9:05 PM Performed by: Norval Gable, MD Authorized by: Norval Gable, MD   Consent:    Consent obtained:  Verbal   Consent given by:  Patient   Risks discussed:  Bleeding, infection, incomplete removal, dizziness, pain and TM perforation   Alternatives discussed:  No treatment Procedure details:    Location:  L ear   Procedure type: irrigation   Post-procedure details:    Patient tolerance of procedure:  Procedure terminated at patient's request   (including critical care time)  Medications Ordered in UC Medications - No data to display  Initial Impression / Assessment and Plan / UC Course  I have reviewed the triage vital signs and the nursing notes.  Pertinent labs & imaging results that were available during my care of the patient were reviewed by me and considered in my medical decision making (see chart for details).      Final Clinical Impressions(s) / UC Diagnoses   Final diagnoses:  Left-sided headache  Scalp tenderness     Discharge Instructions     Recommend patient go to Emergency Department for further evaluation and management    ED Prescriptions    None      1. diagnosis reviewed with patient and friend; recommend patient go to Emergency Department for further evaluation and management. Patient in stable condition and will proceed by private vehicle with friend driving.    PDMP not reviewed this encounter.   Norval Gable, MD 06/24/19 2103    Norval Gable, MD 06/24/19 2105

## 2019-06-24 NOTE — Discharge Instructions (Signed)
Recommend patient go to Emergency Department for further evaluation and management °

## 2019-07-09 ENCOUNTER — Ambulatory Visit: Payer: Medicare HMO

## 2019-07-09 ENCOUNTER — Ambulatory Visit: Payer: Medicare HMO | Admitting: Family Medicine

## 2019-08-06 ENCOUNTER — Ambulatory Visit: Payer: Medicare HMO

## 2019-09-03 ENCOUNTER — Ambulatory Visit: Payer: Medicare HMO | Admitting: Family Medicine

## 2019-10-22 ENCOUNTER — Ambulatory Visit (INDEPENDENT_AMBULATORY_CARE_PROVIDER_SITE_OTHER): Payer: Medicare HMO

## 2019-10-22 DIAGNOSIS — Z0000 Encounter for general adult medical examination without abnormal findings: Secondary | ICD-10-CM

## 2019-10-22 DIAGNOSIS — Z748 Other problems related to care provider dependency: Secondary | ICD-10-CM

## 2019-10-22 NOTE — Progress Notes (Signed)
Subjective:   Samantha [name] is a 86 y.o. female who presents for an Initial Medicare Annual Wellness Visit.  Virtual Visit via Telephone Note  I connected with  Samantha [name] on 10/22/19 at  9:20 AM EDT by telephone and verified that I am speaking with the correct person using two identifiers.  Medicare Annual Wellness visit completed telephonically due to Covid-19 pandemic.   Location: Patient: home Provider: [place]   I discussed the limitations, risks, security and privacy concerns of performing an evaluation and management service by telephone and the availability of in person appointments. The patient expressed understanding and agreed to proceed.  Unable to perform video visit due to video visit attempted and failed and/or patient does not have video capability.   Some vital signs may be absent or patient reported.   Clemetine Marker, LPN    Review of Systems     Cardiac Risk Factors include: advanced age (>1men, >64 women);dyslipidemia;hypertension;sedentary lifestyle     Objective:    There were no vitals filed for this visit. There is no height or weight on file to calculate BMI.  Advanced Directives 10/22/2019 03/04/2017 08/26/2016 08/18/2016 08/18/2016 08/16/2016 11/24/2015  Does Patient Have a Medical Advance Directive? No No Yes Yes Yes No No  Type of Advance Directive - Programmer, multimedia of Attorney - - -  Does patient want to make changes to medical advance directive? - - - No - Patient declined No - Patient declined - -  Copy of [place] in Chart? - - No - copy requested No - copy requested - - -  Would patient like information on creating a medical advance directive? Yes (MAU/Ambulatory/Procedural Areas - Information given) No - Patient declined No - Patient declined - No - Patient declined - No - patient declined information    Current Medications (verified) Outpatient Encounter Medications as of 10/22/2019    Medication Sig   apixaban (ELIQUIS) 5 MG TABS tablet Take 1 tablet (5 mg total) by mouth 2 (two) times daily.   atorvastatin (LIPITOR) 80 MG tablet Take 1 tablet (80 mg total) by mouth at bedtime.   Cholecalciferol (VITAMIN D-1000 MAX ST) 1000 units tablet Take 1,000 Units by mouth daily.   ezetimibe (ZETIA) 10 MG tablet Take 1 tablet (10 mg total) by mouth daily.   famotidine (PEPCID) 20 MG tablet Take 1 tablet (20 mg total) by mouth 2 (two) times daily as needed for heartburn or indigestion.   FLUoxetine (PROZAC) 40 MG capsule Take 1 capsule (40 mg total) by mouth daily.   isosorbide mononitrate (IMDUR) 60 MG 24 hr tablet Take 1 tablet (60 mg total) by mouth daily.   lisinopril-hydrochlorothiazide (ZESTORETIC) 20-12.5 MG tablet Take 1 tablet by mouth daily.   metoprolol succinate (TOPROL-XL) 50 MG 24 hr tablet Take 1 tablet (50 mg total) by mouth daily. Take with or immediately following a meal.   Multiple Vitamin (MULTIVITAMIN) tablet Take 1 tablet by mouth daily.   [DISCONTINUED] Melatonin 3 MG SUBL Place 1 tablet under the tongue daily.   No facility-administered encounter medications on file as of 10/22/2019.    Allergies (verified) Iodine and Levaquin [levofloxacin]   History: Past Medical History:  Diagnosis Date   A-fib ([place]) 06/07/2014   Arm fracture 03/10/2017   right   CAD (coronary artery disease) 06/07/2014   Closed right radial fracture 03/21/2017   Depression 09/24/2015   Dyslipidemia 08/16/2016   Dysphonia 08/16/2016   Hypertension  Mild aortic stenosis 08/22/2016   Echo July 2018   Mitral regurgitation 08/22/2016   Echo July 2018   Stroke [facility])    Past Surgical History:  Procedure Laterality Date   ABDOMINAL HYSTERECTOMY     APPENDECTOMY     CHOLECYSTECTOMY     FRACTURE SURGERY     Family History  Problem Relation Age of Onset   Stroke Mother    Heart attack Father    Heart disease Sister    Diabetes Sister    Heart disease  Brother    Heart attack Brother    Diabetes Maternal Grandfather    Ovarian cancer Paternal Grandmother    Cancer Brother        bladder cancer   Diabetes Brother    Heart disease Brother    Hypertension Brother    Heart attack Daughter    Social History   Socioeconomic History   Marital status: Widowed    Spouse name: Not on file   Number of children: 3   Years of education: Not on file   Highest education level: Not on file  Occupational History   Not on file  Tobacco Use   Smoking status: Never Smoker   Smokeless tobacco: Never Used  Vaping Use   Vaping Use: Never used  Substance and Sexual Activity   Alcohol use: No   Drug use: No   Sexual activity: Never  Other Topics Concern   Not on file  Social History Narrative   Only has 1 living child left   Social Determinants of Health   Financial Resource Strain: Medium Risk   Difficulty of Paying Living Expenses: Somewhat hard  Food Insecurity: No Food Insecurity   Worried About Charity fundraiser in the Last Year: Never true   Ran Out of Food in the Last Year: Never true  Transportation Needs: Unmet Transportation Needs   Lack of Transportation (Medical): Yes   Lack of Transportation (Non-Medical): Yes  Physical Activity: Inactive   Days of Exercise per Week: 0 days   Minutes of Exercise per Session: 0 min  Stress: No Stress Concern Present   Feeling of Stress : Only a little  Social Connections: Moderately Isolated   Frequency of Communication with Friends and Family: More than three times a week   Frequency of Social Gatherings with Friends and Family: Three times a week   Attends Religious Services: More than 4 times per year   Active Member of Clubs or Organizations: No   Attends Archivist Meetings: Never   Marital Status: Widowed    Tobacco Counseling Counseling given: Not Answered   Clinical Intake:  Pre-visit preparation completed: Yes  Pain :  No/denies pain     Nutritional Risks: None Diabetes: No  How often do you need to have someone help you when you read instructions, pamphlets, or other written materials from your doctor or pharmacy?: 1 - Never    Interpreter Needed?: No  Information entered by :: Clemetine Marker LPN   Activities of Daily Living In your present state of health, do you have any difficulty performing the following activities: 10/22/2019 05/08/2019  Hearing? Tempie Donning  Comment wears hearing aids -  Vision? N N  Difficulty concentrating or making decisions? Y N  Walking or climbing stairs? Y N  Dressing or bathing? N N  Doing errands, shopping? N N  Preparing Food and eating ? N -  Using the Toilet? N -  In the past six  months, have you accidently leaked urine? N -  Do you have problems with loss of bowel control? N -  Managing your Medications? N -  Managing your Finances? N -  Housekeeping or managing your Housekeeping? N -  Some recent data might be hidden    Patient Care Team: Delsa Grana, PA-C as PCP - General (Family Medicine)  Indicate any recent Medical Services you may have received from other than [facility] providers in the past year (date may be approximate).     Assessment:   This is a routine wellness examination for Samantha [name].  Hearing/Vision screen  Hearing Screening   125Hz  250Hz  500Hz  1000Hz  2000Hz  3000Hz  4000Hz  6000Hz  8000Hz   Right ear:           Left ear:           Comments: Pt wears hearing aids maintained by [employer]   Vision Screening Comments: [facility] screenings [facility]  Dietary issues and exercise activities discussed: Current Exercise Habits: The patient does not participate in regular exercise at present, Exercise limited by: cardiac condition(s)  Goals     DIET - INCREASE WATER INTAKE     Recommend drinking 6-8 glasses of water per day      Depression Screen PHQ 2/9 Scores 10/22/2019 05/08/2019 11/19/2018 02/27/2018 02/27/2018 02/27/2018 09/11/2017    PHQ - 2 Score 2 4 0 1 5 3  0  PHQ- 9 Score 3 9 0 - 11 9 -    Fall Risk Fall Risk  10/22/2019 05/08/2019 11/19/2018 02/27/2018 09/11/2017  Falls in the past year? 0 0 0 0 Yes  Number falls in past yr: 0 0 0 0 1  Injury with Fall? 0 0 0 0 Yes  Comment - - - - -  Risk Factor Category  - - - - -  Risk for fall due to : Impaired balance/gait Other (Comment) - - -  Follow up Falls prevention discussed Falls evaluation completed Falls evaluation completed - -    Any stairs in or around the home? Yes  If so, are there any without handrails? No    Home free of loose throw rugs in walkways, pet beds, electrical cords, etc? Yes  Adequate lighting in your home to reduce risk of falls? Yes   ASSISTIVE DEVICES UTILIZED TO PREVENT FALLS:  Life alert? No  Use of a cane, walker or w/c? Yes  Grab bars in the bathroom? Yes  Shower chair or bench in shower? Yes  Elevated toilet seat or a handicapped toilet? No   TIMED UP AND GO:  Was the test performed? No . Telephonic visit.    Cognitive Function:     6CIT Screen 10/22/2019  What Year? 0 points  What month? 0 points  What time? 0 points  Count back from 20 0 points  Months in reverse 0 points  Repeat phrase 0 points  Total Score 0    Immunizations Immunization History  Administered Date(s) Administered   Influenza, High Dose Seasonal PF 11/16/2018   Influenza-Unspecified 10/24/2011, 11/22/2012, 10/25/2013, 11/20/2015, 11/17/2016, 11/20/2017   Moderna SARS-COVID-2 Vaccination 03/15/2019, 04/12/2019   Pneumococcal Conjugate-13 11/20/2017   Pneumococcal Polysaccharide-23 01/31/2010   Tdap 10/24/2011    TDAP status: Up to date   Flu Vaccine status: Declined, Education has been provided regarding the importance of this vaccine but patient still declined. Advised may receive this vaccine at local pharmacy or Health Dept. Aware to provide a copy of the vaccination record if obtained from local pharmacy  or Health Dept. Verbalized  acceptance and understanding.   Pneumococcal vaccine status: Up to date   Covid-19 vaccine status: Completed vaccines  Qualifies for Shingles Vaccine? Yes   Zostavax completed No   Shingrix Completed?: No.    Education has been provided regarding the importance of this vaccine. Patient has been advised to call insurance company to determine out of pocket expense if they have not yet received this vaccine. Advised may also receive vaccine at local pharmacy or Health Dept. Verbalized acceptance and understanding.  Screening Tests Health Maintenance  Topic Date Due   INFLUENZA VACCINE  09/01/2019   TETANUS/TDAP  02/01/2023   COVID-19 Vaccine  Completed   DEXA SCAN  Addressed   PNA vac Low Risk Adult  Addressed    Health Maintenance  Health Maintenance Due  Topic Date Due   INFLUENZA VACCINE  09/01/2019    Colorectal cancer screening: No longer required.    Mammogram status: No longer required.    Bone density status: no longer required  Lung Cancer Screening: (Low Dose CT Chest recommended if Age 76-80 years, 30 pack-year currently smoking OR have quit w/in 15years.) does not qualify.   Additional Screening:  Hepatitis C Screening: does not qualify  Vision Screening: Recommended annual ophthalmology exams for early detection of glaucoma and other disorders of the eye. Is the patient up to date with their annual eye exam?  Yes  Who is the provider or what is the name of the office in which the patient attends annual eye exams? [place] Screening: Recommended annual dental exams for proper oral hygiene  Community Resource Referral / Chronic Care Management: CRR required this visit?  Yes - transportation  CCM required this visit?  No      Plan:     I have personally reviewed and noted the following in the patients chart:    Medical and social history  Use of alcohol, tobacco or illicit drugs   Current medications and  supplements  Functional ability and status  Nutritional status  Physical activity  Advanced directives  List of other physicians  Hospitalizations, surgeries, and ER visits in previous 12 months  Vitals  Screenings to include cognitive, depression, and falls  Referrals and appointments  In addition, I have reviewed and discussed with patient certain preventive protocols, quality metrics, and best practice recommendations. A written personalized care plan for preventive services as well as general preventive health recommendations were provided to patient.     Clemetine Marker, LPN   02/08/3233   Nurse Notes: patient advised to schedule 6 month follow up for November.

## 2019-10-22 NOTE — Patient Instructions (Signed)
Samantha [name] , Thank you for taking time to come for your Medicare Wellness Visit. I appreciate your ongoing commitment to your health goals. Please review the following plan we discussed and let me know if I can assist you in the future.   Screening recommendations/referrals: Colonoscopy: no longer required Mammogram: no longer required Bone Density: no longer required Recommended yearly ophthalmology/optometry visit for glaucoma screening and checkup Recommended yearly dental visit for hygiene and checkup  Vaccinations: Influenza vaccine: due Pneumococcal vaccine: done 11/20/17 Tdap vaccine: done 10/24/11 Shingles vaccine: Shingrix discussed. Please contact your pharmacy for coverage information.  Covid-19: done 03/15/19 & 04/12/19  Advanced directives: Advance directive discussed with you today. I have provided a copy for you to complete at home and have notarized. Once this is complete please bring a copy in to our office so we can scan it into your chart.  Conditions/risks identified: Recommend drinking 6-8 glasses of water per day   Next appointment: Follow up in one year for your annual wellness visit    Preventive Care 65 Years and Older, Female Preventive care refers to lifestyle choices and visits with your health care provider that can promote health and wellness. What does preventive care include?  A yearly physical exam. This is also called an annual well check.  Dental exams once or twice a year.  Routine eye exams. Ask your health care provider how often you should have your eyes checked.  Personal lifestyle choices, including:  Daily care of your teeth and gums.  Regular physical activity.  Eating a healthy diet.  Avoiding tobacco and drug use.  Limiting alcohol use.  Practicing safe sex.  Taking low-dose aspirin every day.  Taking vitamin and mineral supplements as recommended by your health care provider. What happens during an annual well check? The  services and screenings done by your health care provider during your annual well check will depend on your age, overall health, lifestyle risk factors, and family history of disease. Counseling  Your health care provider may ask you questions about your:  Alcohol use.  Tobacco use.  Drug use.  Emotional well-being.  Home and relationship well-being.  Sexual activity.  Eating habits.  History of falls.  Memory and ability to understand (cognition).  Work and work Statistician.  Reproductive health. Screening  You may have the following tests or measurements:  Height, weight, and BMI.  Blood pressure.  Lipid and cholesterol levels. These may be checked every 5 years, or more frequently if you are over 72 years old.  Skin check.  Lung cancer screening. You may have this screening every year starting at age 58 if you have a 30-pack-year history of smoking and currently smoke or have quit within the past 15 years.  Fecal occult blood test (FOBT) of the stool. You may have this test every year starting at age 68.  Flexible sigmoidoscopy or colonoscopy. You may have a sigmoidoscopy every 5 years or a colonoscopy every 10 years starting at age 60.  Hepatitis C blood test.  Hepatitis B blood test.  Sexually transmitted disease (STD) testing.  Diabetes screening. This is done by checking your blood sugar (glucose) after you have not eaten for a while (fasting). You may have this done every 1-3 years.  Bone density scan. This is done to screen for osteoporosis. You may have this done starting at age 57.  Mammogram. This may be done every 1-2 years. Talk to your health care provider about how often you should have  regular mammograms. Talk with your health care provider about your test results, treatment options, and if necessary, the need for more tests. Vaccines  Your health care provider may recommend certain vaccines, such as:  Influenza vaccine. This is recommended  every year.  Tetanus, diphtheria, and acellular pertussis (Tdap, Td) vaccine. You may need a Td booster every 10 years.  Zoster vaccine. You may need this after age 89.  Pneumococcal 13-valent conjugate (PCV13) vaccine. One dose is recommended after age 36.  Pneumococcal polysaccharide (PPSV23) vaccine. One dose is recommended after age 74. Talk to your health care provider about which screenings and vaccines you need and how often you need them. This information is not intended to replace advice given to you by your health care provider. Make sure you discuss any questions you have with your health care provider. Document Released: 02/13/2015 Document Revised: 10/07/2015 Document Reviewed: 11/18/2014 Elsevier Interactive Patient Education  2017 [place] Prevention in the Home Falls can cause injuries. They can happen to people of all ages. There are many things you can do to make your home safe and to help prevent falls. What can I do on the outside of my home?  Regularly fix the edges of walkways and driveways and fix any cracks.  Remove anything that might make you trip as you walk through a door, such as a raised step or threshold.  Trim any bushes or trees on the path to your home.  Use bright outdoor lighting.  Clear any walking paths of anything that might make someone trip, such as rocks or tools.  Regularly check to see if handrails are loose or broken. Make sure that both [place]cella of any steps have handrails.  Any raised decks and porches should have guardrails on the edges.  Have any leaves, snow, or ice cleared regularly.  Use sand or salt on walking paths during winter.  Clean up any spills in your garage right away. This includes oil or grease spills. What can I do in the bathroom?  Use night lights.  Install grab bars by the toilet and in the tub and shower. Do not use towel bars as grab bars.  Use non-skid mats or decals in the tub or shower.  If  you need to sit down in the shower, use a plastic, non-slip stool.  Keep the floor dry. Clean up any water that spills on the floor as soon as it happens.  Remove soap buildup in the tub or shower regularly.  Attach bath mats securely with double-sided non-slip rug tape.  Do not have throw rugs and other things on the floor that can make you trip. What can I do in the bedroom?  Use night lights.  Make sure that you have a light by your bed that is easy to reach.  Do not use any sheets or blankets that are too big for your bed. They should not hang down onto the floor.  Have a firm chair that has side arms. You can use this for support while you get dressed.  Do not have throw rugs and other things on the floor that can make you trip. What can I do in the kitchen?  Clean up any spills right away.  Avoid walking on wet floors.  Keep items that you use a lot in easy-to-reach places.  If you need to reach something above you, use a strong step stool that has a grab bar.  Keep electrical cords out of the  way.  Do not use floor polish or wax that makes floors slippery. If you must use wax, use non-skid floor wax.  Do not have throw rugs and other things on the floor that can make you trip. What can I do with my stairs?  Do not leave any items on the stairs.  Make sure that there are handrails on both [place] of the stairs and use them. Fix handrails that are broken or loose. Make sure that handrails are as long as the stairways.  Check any carpeting to make sure that it is firmly attached to the stairs. Fix any carpet that is loose or worn.  Avoid having throw rugs at the top or bottom of the stairs. If you do have throw rugs, attach them to the floor with carpet tape.  Make sure that you have a light switch at the top of the stairs and the bottom of the stairs. If you do not have them, ask someone to add them for you. What else can I do to help prevent falls?  Wear shoes  that:  Do not have high heels.  Have rubber bottoms.  Are comfortable and fit you well.  Are closed at the toe. Do not wear sandals.  If you use a stepladder:  Make sure that it is fully opened. Do not climb a closed stepladder.  Make sure that both Henne of the stepladder are locked into place.  Ask someone to hold it for you, if possible.  Clearly mark and make sure that you can see:  Any grab bars or handrails.  First and last steps.  Where the edge of each step is.  Use tools that help you move around (mobility aids) if they are needed. These include:  Canes.  Walkers.  Scooters.  Crutches.  Turn on the lights when you go into a dark area. Replace any light bulbs as soon as they burn out.  Set up your furniture so you have a clear path. Avoid moving your furniture around.  If any of your floors are uneven, fix them.  If there are any pets around you, be aware of where they are.  Review your medicines with your doctor. Some medicines can make you feel dizzy. This can increase your chance of falling. Ask your doctor what other things that you can do to help prevent falls. This information is not intended to replace advice given to you by your health care provider. Make sure you discuss any questions you have with your health care provider. Document Released: 11/13/2008 Document Revised: 06/25/2015 Document Reviewed: 02/21/2014 Elsevier Interactive Patient Education  2017 [employer].

## 2019-10-25 ENCOUNTER — Telehealth: Payer: Self-pay | Admitting: Family Medicine

## 2019-10-25 NOTE — Telephone Encounter (Signed)
Samantha [name] 10/25/2019 Called pt regarding community resource referral received. Left message for pt to call me back, my info is [phone] please see ref notes for more details.  [place], Care Management

## 2019-11-01 ENCOUNTER — Telehealth: Payer: Self-pay | Admitting: Family Medicine

## 2019-11-01 NOTE — Telephone Encounter (Signed)
Samantha [name] 11/01/2019 Called pt regarding community resource referral received. Left message for pt to call me back, my info is [phone] please see ref notes for more details.  [place], Care Management

## 2019-11-11 ENCOUNTER — Ambulatory Visit: Payer: Self-pay | Admitting: *Deleted

## 2019-11-11 DIAGNOSIS — I10 Essential (primary) hypertension: Secondary | ICD-10-CM

## 2019-11-11 DIAGNOSIS — Z748 Other problems related to care provider dependency: Secondary | ICD-10-CM

## 2019-11-11 NOTE — Chronic Care Management (AMB) (Signed)
  Chronic Care Management   Social Work Note  11/11/2019 Name: Samantha [name] MRN: [id number] DOB: Feb 01, 1928  Samantha [name] is a 86 y.o. year old female who sees Delsa Grana, [place] for primary care. The CCM team was consulted for assistance with [name] .   Phone call to patient to schedule initial intake appointment for CCM services. Patient discussed desire for assistance with getting a new hearing aid of possible. Initial appointment scheduled for 11/13/19  SDOH (Social Determinants of Health) assessments performed: No     Outpatient Encounter Medications as of 11/11/2019  Medication Sig  . apixaban (ELIQUIS) 5 MG TABS tablet Take 1 tablet (5 mg total) by mouth 2 (two) times daily.  Marland Kitchen atorvastatin (LIPITOR) 80 MG tablet Take 1 tablet (80 mg total) by mouth at bedtime.  . Cholecalciferol (VITAMIN D-1000 MAX ST) 1000 units tablet Take 1,000 Units by mouth daily.  Marland Kitchen ezetimibe (ZETIA) 10 MG tablet Take 1 tablet (10 mg total) by mouth daily.  . famotidine (PEPCID) 20 MG tablet Take 1 tablet (20 mg total) by mouth 2 (two) times daily as needed for heartburn or indigestion.  Marland Kitchen FLUoxetine (PROZAC) 40 MG capsule Take 1 capsule (40 mg total) by mouth daily.  . isosorbide mononitrate (IMDUR) 60 MG 24 hr tablet Take 1 tablet (60 mg total) by mouth daily.  Marland Kitchen lisinopril-hydrochlorothiazide (ZESTORETIC) 20-12.5 MG tablet Take 1 tablet by mouth daily.  . metoprolol succinate (TOPROL-XL) 50 MG 24 hr tablet Take 1 tablet (50 mg total) by mouth daily. Take with or immediately following a meal.  . Multiple Vitamin (MULTIVITAMIN) tablet Take 1 tablet by mouth daily.   No facility-administered encounter medications on file as of 11/11/2019.    Goals Addressed   None     Follow Up Plan: Appointment scheduled for SW follow up with client by phone on: 11/13/19  Elliot Gurney, Justin Worker  [facility] Care Management [phone]

## 2019-11-13 ENCOUNTER — Ambulatory Visit: Payer: Self-pay | Admitting: *Deleted

## 2019-11-13 DIAGNOSIS — Z748 Other problems related to care provider dependency: Secondary | ICD-10-CM

## 2019-11-13 DIAGNOSIS — I10 Essential (primary) hypertension: Secondary | ICD-10-CM

## 2019-11-14 NOTE — Chronic Care Management (AMB) (Signed)
Chronic Care Management    Clinical Social Work Follow Up Note  11/14/2019 Name: Samantha [name] MRN: [id number] DOB: [birth year]  Samantha [name] is a 86 y.o. year old female who is a primary care patient of Delsa Grana, [place]. The CCM team was consulted for assistance with [name] .    Samantha [name] is a 86 y.o. year old female who is a primary care patient of Delsa Grana, [place]. The CM team was consulted for assistance with chronic disease management and care coordination.   I reached out to Lance Bosch by phone today.   Ms. Schleich was given information about Chronic Care Management services today including:  1. CCM service includes personalized support from designated clinical staff supervised by her physician, including individualized plan of care and coordination with other care providers 2. 24/7 contact phone numbers for assistance for urgent and routine care needs. 3. Service will only be billed when office clinical staff spend 20 minutes or more in a month to coordinate care. 4. Only one practitioner may furnish and bill the service in a calendar month. 5. The patient may stop CCM services at any time (effective at the end of the month) by phone call to the office staff. 6. The patient will be responsible for cost sharing (co-pay) of up to 20% of the service fee (after annual deductible is met). Patient agreed to services and verbal consent obtained.     Review of patient status, including review of consultants reports, other relevant assessments, and collaboration with appropriate care team members and the patient's provider was performed as part of comprehensive patient evaluation and provision of chronic care management services.    SDOH (Social Determinants of Health) assessments performed: No    Outpatient Encounter Medications as of 11/13/2019  Medication Sig  . apixaban (ELIQUIS) 5 MG TABS tablet Take 1 tablet (5 mg total) by mouth 2 (two) times daily.    Marland Kitchen atorvastatin (LIPITOR) 80 MG tablet Take 1 tablet (80 mg total) by mouth at bedtime.  . Cholecalciferol (VITAMIN D-1000 MAX ST) 1000 units tablet Take 1,000 Units by mouth daily.  Marland Kitchen ezetimibe (ZETIA) 10 MG tablet Take 1 tablet (10 mg total) by mouth daily.  . famotidine (PEPCID) 20 MG tablet Take 1 tablet (20 mg total) by mouth 2 (two) times daily as needed for heartburn or indigestion.  Marland Kitchen FLUoxetine (PROZAC) 40 MG capsule Take 1 capsule (40 mg total) by mouth daily.  . isosorbide mononitrate (IMDUR) 60 MG 24 hr tablet Take 1 tablet (60 mg total) by mouth daily.  Marland Kitchen lisinopril-hydrochlorothiazide (ZESTORETIC) 20-12.5 MG tablet Take 1 tablet by mouth daily.  . metoprolol succinate (TOPROL-XL) 50 MG 24 hr tablet Take 1 tablet (50 mg total) by mouth daily. Take with or immediately following a meal.  . Multiple Vitamin (MULTIVITAMIN) tablet Take 1 tablet by mouth daily.   No facility-administered encounter medications on file as of 11/13/2019.     Goals Addressed            This Visit's Progress   . "I believe that I should qualify for Medicaid"       CARE PLAN ENTRY (see longitudinal plan of care for additional care plan information)  Current Barriers:  . Financial constraints related to difficulties living on a fixed income  Clinical Social Work Clinical Goal(s):  Marland Kitchen Over the next 90 days, patient will work with the social work team  to assist with the application process for identified community  assistance  Interventions: . Inter-disciplinary care team collaboration (see longitudinal plan of care) . Patient interviewed and appropriate assessments performed . Patient confirmed being hard of hearing , however recently received new battery for her hearing aid and is now able to hear better. . Patient confirmed that she was told by the doctor that a new hearing aid would not improve her hearing. Patient confirms no need for a new hearing aid, however does feel that she needs assistance  financially . Patient confirms currently receiving food stamps and is currently on the waiting list for meals on [employer] . Applying for the energy assistance program, [employer]-for transportation and Medicaid . Per patient she is able to cook for herself and her sister takes her to the store, when she is available . Patient confirmed interest in applying for Medicaid, the energy assistance program and enrolling in [employer] due to limited finances . Discussed plans with patient for ongoing care management follow up and provided patient with direct contact information for care management team   Patient Self Care Activities:  . Patient verbalizes understanding of plan to identify eligibility requirements for suggested community resources . Knowledge deficits regarding community resources that she may be eligible for  Initial goal documentation         Follow Up Plan: SW will follow up with patient by phone over the next 7-14 business days   [place], [place] Worker  [facility] Care Management [phone]

## 2019-11-14 NOTE — Patient Instructions (Signed)
Thank you allowing the Chronic Care Management Team to be a part of your care! It was a pleasure speaking with you today!   Samantha [name] was given information about Chronic Care Management services today including:  CCM service includes personalized support from designated clinical staff supervised by her physician, including individualized plan of care and coordination with other care providers 24/7 contact phone numbers for assistance for urgent and routine care needs. Service will only be billed when office clinical staff spend 20 minutes or more in a month to coordinate care. Only one practitioner may furnish and bill the service in a calendar month. The patient may stop CCM services at any time (effective at the end of the month) by phone call to the office staff. The patient will be responsible for cost sharing (co-pay) of up to 20% of the service fee (after annual deductible is met).  CCM (Chronic Care Management) Team   Samantha Labella RN, BSN Nurse Care Coordinator  [phone]  [place], LCSW Clinical Social Worker [phone]   The patient verbalized understanding of instructions provided today and declined a print copy of patient instruction materials.   Telephone follow up appointment with care management team member scheduled for: 11/25/19

## 2019-11-25 ENCOUNTER — Ambulatory Visit: Payer: Self-pay | Admitting: *Deleted

## 2019-11-25 DIAGNOSIS — I10 Essential (primary) hypertension: Secondary | ICD-10-CM

## 2019-11-25 DIAGNOSIS — Z748 Other problems related to care provider dependency: Secondary | ICD-10-CM

## 2019-11-25 NOTE — Chronic Care Management (AMB) (Signed)
  Chronic Care Management    Clinical Social Work Follow Up Note  11/25/2019 Name: Samantha [name] MRN: [id number] DOB: Jan 16, 1929  Samantha [name] is a 86 y.o. year old female who is a primary care patient of Delsa Grana, [place]. The CCM team was consulted for assistance with [name] .   Review of patient status, including review of consultants reports, other relevant assessments, and collaboration with appropriate care team members and the patient's provider was performed as part of comprehensive patient evaluation and provision of chronic care management services.    SDOH (Social Determinants of Health) assessments performed: No    Outpatient Encounter Medications as of 11/25/2019  Medication Sig  . apixaban (ELIQUIS) 5 MG TABS tablet Take 1 tablet (5 mg total) by mouth 2 (two) times daily.  Marland Kitchen atorvastatin (LIPITOR) 80 MG tablet Take 1 tablet (80 mg total) by mouth at bedtime.  . Cholecalciferol (VITAMIN D-1000 MAX ST) 1000 units tablet Take 1,000 Units by mouth daily.  Marland Kitchen ezetimibe (ZETIA) 10 MG tablet Take 1 tablet (10 mg total) by mouth daily.  . famotidine (PEPCID) 20 MG tablet Take 1 tablet (20 mg total) by mouth 2 (two) times daily as needed for heartburn or indigestion.  Marland Kitchen FLUoxetine (PROZAC) 40 MG capsule Take 1 capsule (40 mg total) by mouth daily.  . isosorbide mononitrate (IMDUR) 60 MG 24 hr tablet Take 1 tablet (60 mg total) by mouth daily.  Marland Kitchen lisinopril-hydrochlorothiazide (ZESTORETIC) 20-12.5 MG tablet Take 1 tablet by mouth daily.  . metoprolol succinate (TOPROL-XL) 50 MG 24 hr tablet Take 1 tablet (50 mg total) by mouth daily. Take with or immediately following a meal.  . Multiple Vitamin (MULTIVITAMIN) tablet Take 1 tablet by mouth daily.   No facility-administered encounter medications on file as of 11/25/2019.     Goals Addressed            This Visit's Progress   . "I believe that I should qualify for Medicaid"       CARE PLAN ENTRY (see  longitudinal plan of care for additional care plan information)  Current Barriers:  . Financial constraints related to difficulties living on a fixed income  Clinical Social Work Clinical Goal(s):  Marland Kitchen Over the next 90 days, patient will work with the social work team  to assist with the application process for identified community assistance  Interventions: . Inter-disciplinary care team collaboration (see longitudinal plan of care) . Phone call to the Department of Services to follow up on possibility of applying for services through the energy assistance program-no funding for this program at this this time . Medicaid application will be obtained and mailed to her for completion. . Patient to call [employer] when transportation if [phone] . Phone call to patient to provide above resources, voicemail message left requesting return call.    Patient Self Care Activities:  . Patient verbalizes understanding of plan to identify eligibility requirements for suggested community resources . Knowledge deficits regarding community resources that she may be eligible for  Initial goal documentation          Follow Up Plan: SW will follow up with patient by phone over the next 5-7 days   [place], [facility] Care Management [phone]

## 2019-11-27 ENCOUNTER — Telehealth: Payer: Self-pay | Admitting: *Deleted

## 2019-11-27 NOTE — Telephone Encounter (Signed)
   Chronic Care Management   Unsuccessful Call Note 11/27/2019 Name: Samantha [name] MRN: [id number] DOB: Oct 23, 1928  Patient  is a 86 year old female who sees Delsa Grana, PA-C for primary care. Delsa Grana, PA-C asked the CCM team to consult the patient for community resources.    This social worker was unable to reach patient via telephone today for follow up call. I have left HIPAA compliant voicemail asking patient to return my call. (unsuccessful outreach #[phone]).   Plan: Will follow-up within 7 business days via telephone.     Elliot Gurney, [place] Administrator, [facility] [phone]

## 2019-12-02 ENCOUNTER — Ambulatory Visit: Payer: Self-pay | Admitting: *Deleted

## 2019-12-02 DIAGNOSIS — I10 Essential (primary) hypertension: Secondary | ICD-10-CM

## 2019-12-02 DIAGNOSIS — Z748 Other problems related to care provider dependency: Secondary | ICD-10-CM

## 2019-12-02 NOTE — Chronic Care Management (AMB) (Signed)
Chronic Care Management    Clinical Social Work Follow Up Note  12/02/2019 Name: MERCIE BALSLEY MRN: [id number] DOB: Sep 29, 1928  ANUJA MANKA is a 86 y.o. year old female who is a primary care patient of Delsa Grana, [place]. The CCM team was consulted for assistance with [name] .   Review of patient status, including review of consultants reports, other relevant assessments, and collaboration with appropriate care team members and the patient's provider was performed as part of comprehensive patient evaluation and provision of chronic care management services.    SDOH (Social Determinants of Health) assessments performed: No    Outpatient Encounter Medications as of 12/02/2019  Medication Sig  . apixaban (ELIQUIS) 5 MG TABS tablet Take 1 tablet (5 mg total) by mouth 2 (two) times daily.  Marland Kitchen atorvastatin (LIPITOR) 80 MG tablet Take 1 tablet (80 mg total) by mouth at bedtime.  . Cholecalciferol (VITAMIN D-1000 MAX ST) 1000 units tablet Take 1,000 Units by mouth daily.  Marland Kitchen ezetimibe (ZETIA) 10 MG tablet Take 1 tablet (10 mg total) by mouth daily.  . famotidine (PEPCID) 20 MG tablet Take 1 tablet (20 mg total) by mouth 2 (two) times daily as needed for heartburn or indigestion.  Marland Kitchen FLUoxetine (PROZAC) 40 MG capsule Take 1 capsule (40 mg total) by mouth daily.  . isosorbide mononitrate (IMDUR) 60 MG 24 hr tablet Take 1 tablet (60 mg total) by mouth daily.  Marland Kitchen lisinopril-hydrochlorothiazide (ZESTORETIC) 20-12.5 MG tablet Take 1 tablet by mouth daily.  . metoprolol succinate (TOPROL-XL) 50 MG 24 hr tablet Take 1 tablet (50 mg total) by mouth daily. Take with or immediately following a meal.  . Multiple Vitamin (MULTIVITAMIN) tablet Take 1 tablet by mouth daily.   No facility-administered encounter medications on file as of 12/02/2019.     Goals Addressed            This Visit's Progress   . "I believe that I should qualify for Medicaid"       CARE PLAN ENTRY (see longitudinal  plan of care for additional care plan information)  Current Barriers:  . Financial constraints related to difficulties living on a fixed income  Clinical Social Work Clinical Goal(s):  Marland Kitchen Over the next 90 days, patient will work with the social work team  to assist with the application process for identified community assistance  Interventions: . Inter-disciplinary care team collaboration (see longitudinal plan of care) . Phone call to patient to discuss.provide community resources t . Phone call to the Department of Services to follow up on possibility of applying for services through the energy assistance program discussed-no funding for this program at this this time . Medicaid application obtained and mailed to her for completion-per patient she feels comfortable completing this and will call this social worker if she has questions . Contact information provided for [facility] when transportation if needed-[phone]- to be used as a back up if needed . Phone call to patient to provide above resources, voicemail message left requesting return call.    Patient Self Care Activities:  . Patient verbalizes understanding of plan to identify eligibility requirements for suggested community resources . Knowledge deficits regarding community resources that she may be eligible for  Please see past updates related to this goal by clicking on the "Past Updates" button in the selected goal           Follow Up Plan: SW will follow up with patient by phone over the next 7-14 business days  Elliot Gurney, [place] Administrator, [facility] Care Management [phone]

## 2019-12-02 NOTE — Patient Instructions (Signed)
Thank you allowing the Chronic Care Management Team to be a part of your care! It was a pleasure speaking with you today!  1. Please complete the Medicaid application and return as soon as possible 2. Please call this social worker with any questions or concerns  CCM (Chronic Care Management) Team   Neldon Labella RN, BSN Nurse Care Coordinator  [phone]  [place], LCSW Clinical Social Worker [phone]  Goals Addressed            This Visit's Progress   . "I believe that I should qualify for Medicaid"       CARE PLAN ENTRY (see longitudinal plan of care for additional care plan information)  Current Barriers:  . Financial constraints related to difficulties living on a fixed income  Clinical Social Work Clinical Goal(s):  Marland Kitchen Over the next 90 days, patient will work with the social work team  to assist with the application process for identified community assistance  Interventions: . Inter-disciplinary care team collaboration (see longitudinal plan of care) . Phone call to patient to discuss.provide community resources t . Phone call to the Department of Services to follow up on possibility of applying for services through the energy assistance program discussed-no funding for this program at this this time . Medicaid application obtained and mailed to her for completion-per patient she feels comfortable completing this and will call this social worker if she has questions . Contact information provided for [facility] when transportation if needed-[phone] to be used as a back up if needed . Phone call to patient to provide above resources, voicemail message left requesting return call.    Patient Self Care Activities:  . Patient verbalizes understanding of plan to identify eligibility requirements for suggested community resources . Knowledge deficits regarding community resources that she may be eligible for  Please see past updates related to this goal by  clicking on the "Past Updates" button in the selected goal           The patient verbalized understanding of instructions provided today and declined a print copy of patient instruction materials.   Telephone follow up appointment with care management team member scheduled for:12/18/19

## 2019-12-04 ENCOUNTER — Ambulatory Visit: Payer: Self-pay | Admitting: *Deleted

## 2019-12-04 NOTE — Chronic Care Management (AMB) (Signed)
  Chronic Care Management   Social Work Note  12/04/2019 Name: Samantha [name] MRN: [id number] DOB: [birth year]  Samantha [name] is a 86 y.o. year old female who sees Delsa Grana, [place] for primary care. The CCM team was consulted for assistance with [name] .   Medicaid application mailed to patient's home for completion. Will follow up with patient and will provide assistance as needed.  SDOH (Social Determinants of Health) assessments performed: No     Outpatient Encounter Medications as of 12/04/2019  Medication Sig  . apixaban (ELIQUIS) 5 MG TABS tablet Take 1 tablet (5 mg total) by mouth 2 (two) times daily.  Marland Kitchen atorvastatin (LIPITOR) 80 MG tablet Take 1 tablet (80 mg total) by mouth at bedtime.  . Cholecalciferol (VITAMIN D-1000 MAX ST) 1000 units tablet Take 1,000 Units by mouth daily.  Marland Kitchen ezetimibe (ZETIA) 10 MG tablet Take 1 tablet (10 mg total) by mouth daily.  . famotidine (PEPCID) 20 MG tablet Take 1 tablet (20 mg total) by mouth 2 (two) times daily as needed for heartburn or indigestion.  Marland Kitchen FLUoxetine (PROZAC) 40 MG capsule Take 1 capsule (40 mg total) by mouth daily.  . isosorbide mononitrate (IMDUR) 60 MG 24 hr tablet Take 1 tablet (60 mg total) by mouth daily.  Marland Kitchen lisinopril-hydrochlorothiazide (ZESTORETIC) 20-12.5 MG tablet Take 1 tablet by mouth daily.  . metoprolol succinate (TOPROL-XL) 50 MG 24 hr tablet Take 1 tablet (50 mg total) by mouth daily. Take with or immediately following a meal.  . Multiple Vitamin (MULTIVITAMIN) tablet Take 1 tablet by mouth daily.   No facility-administered encounter medications on file as of 12/04/2019.    Goals Addressed   None     Follow Up Plan: SW will follow up with patient by phone over the next 7-10 business days  [place], [place] Worker  [facility] Care Management [phone]

## 2019-12-18 ENCOUNTER — Ambulatory Visit: Payer: Self-pay | Admitting: *Deleted

## 2019-12-18 DIAGNOSIS — Z748 Other problems related to care provider dependency: Secondary | ICD-10-CM

## 2019-12-18 DIAGNOSIS — F331 Major depressive disorder, recurrent, moderate: Secondary | ICD-10-CM

## 2019-12-18 DIAGNOSIS — I10 Essential (primary) hypertension: Secondary | ICD-10-CM

## 2019-12-19 NOTE — Patient Instructions (Signed)
Thank you allowing the Chronic Care Management Team to be a part of your care! It was a pleasure speaking with you today!  1. Please begin to work on the [employer] application  2. Please call [facility] [phone] if transportation is needed to medical appointments   CCM (Chronic Care Management) Team   Neldon Labella RN, BSN Nurse Care Coordinator  [phone]   [place], LCSW Clinical Social Worker [phone]  Goals Addressed            This Visit's Progress   . "I believe that I should qualify for Medicaid"       CARE PLAN ENTRY (see longitudinal plan of care for additional care plan information)  Current Barriers:  . Financial constraints related to difficulties living on a fixed income  Clinical Social Work Clinical Goal(s):  Marland Kitchen Over the next 90 days, patient will work with the social work team  to assist with the application process for identified community assistance  Interventions: . Inter-disciplinary care team collaboration (see longitudinal plan of care) . Phone call to patient to confirm that she has received the [facility] application . Patient confirmed that she has received the application, however after taking the Booster shot had some side affects. Now that she is feeling better, she can begin to work on completing the Medicaid application. . Patient declined need for Meals on Wheels, stating that she is capable and enjoys cooking her own meals . Contact information provided for [facility] when transportation if [phone] to be used as a back up if needed     Patient Self Care Activities:  . Patient verbalizes understanding of plan to identify eligibility requirements for suggested community resources . Knowledge deficits regarding community resources that she may be eligible for  Please see past updates related to this goal by clicking on the "Past Updates" button in the selected goal           The patient verbalized understanding of  instructions, educational materials, and care plan provided today and declined offer to receive copy of patient instructions, educational materials, and care plan.   Telephone follow up appointment with care management team member scheduled for:  01/01/20

## 2019-12-19 NOTE — Chronic Care Management (AMB) (Signed)
Chronic Care Management    Clinical Social Work Follow Up Note  12/19/2019 Name: Samantha [name] MRN: [id number] DOB: March 31, 1928  Samantha [name] is a 86 y.o. year old female who is a primary care patient of Delsa Grana, [place]. The CCM team was consulted for assistance with [name] .   Review of patient status, including review of consultants reports, other relevant assessments, and collaboration with appropriate care team members and the patient's provider was performed as part of comprehensive patient evaluation and provision of chronic care management services.    SDOH (Social Determinants of Health) assessments performed: No    Outpatient Encounter Medications as of 12/18/2019  Medication Sig  . apixaban (ELIQUIS) 5 MG TABS tablet Take 1 tablet (5 mg total) by mouth 2 (two) times daily.  Marland Kitchen atorvastatin (LIPITOR) 80 MG tablet Take 1 tablet (80 mg total) by mouth at bedtime.  . Cholecalciferol (VITAMIN D-1000 MAX ST) 1000 units tablet Take 1,000 Units by mouth daily.  Marland Kitchen ezetimibe (ZETIA) 10 MG tablet Take 1 tablet (10 mg total) by mouth daily.  . famotidine (PEPCID) 20 MG tablet Take 1 tablet (20 mg total) by mouth 2 (two) times daily as needed for heartburn or indigestion.  Marland Kitchen FLUoxetine (PROZAC) 40 MG capsule Take 1 capsule (40 mg total) by mouth daily.  . isosorbide mononitrate (IMDUR) 60 MG 24 hr tablet Take 1 tablet (60 mg total) by mouth daily.  Marland Kitchen lisinopril-hydrochlorothiazide (ZESTORETIC) 20-12.5 MG tablet Take 1 tablet by mouth daily.  . metoprolol succinate (TOPROL-XL) 50 MG 24 hr tablet Take 1 tablet (50 mg total) by mouth daily. Take with or immediately following a meal.  . Multiple Vitamin (MULTIVITAMIN) tablet Take 1 tablet by mouth daily.   No facility-administered encounter medications on file as of 12/18/2019.     Goals Addressed            This Visit's Progress   . "I believe that I should qualify for Medicaid"       CARE PLAN ENTRY (see  longitudinal plan of care for additional care plan information)  Current Barriers:  . Financial constraints related to difficulties living on a fixed income  Clinical Social Work Clinical Goal(s):  Marland Kitchen Over the next 90 days, patient will work with the social work team  to assist with the application process for identified community assistance  Interventions: . Inter-disciplinary care team collaboration (see longitudinal plan of care) . Phone call to patient to confirm that she has received the [facility] application . Patient confirmed that she has received the application, however after taking the Booster shot had some side affects. Now that she is feeling better, she can begin to work on completing the Medicaid application. . Patient declined need for Meals on Wheels, stating that she is capable and enjoys cooking her own meals . Contact information provided for [facility] when transportation if needed-[phone] to be used as a back up if needed     Patient Self Care Activities:  . Patient verbalizes understanding of plan to identify eligibility requirements for suggested community resources . Knowledge deficits regarding community resources that she may be eligible for  Please see past updates related to this goal by clicking on the "Past Updates" button in the selected goal           Follow Up Plan: SW will follow up with patient by phone over the next 2-4 weeks regarding status of medicaid application completion   [place], [place] Education officer, museum  [facility] Care Management [phone]

## 2020-01-01 ENCOUNTER — Telehealth: Payer: Self-pay | Admitting: *Deleted

## 2020-01-01 ENCOUNTER — Telehealth: Payer: Self-pay

## 2020-01-01 NOTE — Telephone Encounter (Signed)
   Chronic Care Management   Unsuccessful Call Note 01/01/2020 Name: Samantha [name] MRN: [id number] DOB: 10-May-1928  Patient  is a 86 year old female who sees Delsa Grana, PA-C for primary care. Delsa Grana, PA-C asked the CCM team to consult the patient for community resources.      This social worker was unable to reach patient via telephone today for follow up call. I have left HIPAA compliant voicemail asking patient to return my call. (unsuccessful outreach #1).   Plan: Will follow-up within 7 business days via telephone.     Elliot Gurney, [place] Administrator, [facility] Care Management [phone]

## 2020-01-10 ENCOUNTER — Telehealth: Payer: Self-pay | Admitting: *Deleted

## 2020-01-10 NOTE — Telephone Encounter (Signed)
   Chronic Care Management   Unsuccessful Call Note 01/10/2020 Name: Samantha [name] MRN: [id number] DOB: [birth year]  Samantha [name]  is a 86 year old female who sees Delsa Grana, PA-C for primary care. Delsa Grana, PA-C asked the CCM team to consult the patient for [facility].     This social worker was unable to reach patient via telephone today for follow up call. I have left HIPAA compliant voicemail asking patient to return my call. (unsuccessful outreach 2).   Plan: Will follow-up within 7 business days via telephone.     Elliot Gurney, [place] Administrator, [facility] Care Management [phone]

## 2020-01-17 ENCOUNTER — Telehealth: Payer: Self-pay

## 2020-01-17 ENCOUNTER — Telehealth: Payer: Self-pay | Admitting: *Deleted

## 2020-01-17 NOTE — Telephone Encounter (Signed)
   Chronic Care Management   Unsuccessful Call Note 01/17/2020 Name: [name] MRN: [id number] DOB: 1928/05/09  Irania Durell is a 86 year old female who sees Delsa Grana, PA-C for primary care. Delsa Grana, PA-C asked the CCM team to consult the patient for [facility].     This social worker was unable to reach patient via telephone today for follow up on the Medicaid application process. I have left HIPAA compliant voicemail asking patient to return my call. (unsuccessful outreach #3).   Plan: This social worker will make no further outreach calls to patient, however will be happy to engage patient upon her return call.    Elliot Gurney, [place] Administrator, [facility] Center[facility] Care Management [phone]

## 2020-01-27 ENCOUNTER — Emergency Department
Admission: EM | Admit: 2020-01-27 | Discharge: 2020-01-27 | Disposition: A | Discharge status: Home / Self Care | Payer: Medicare HMO | Attending: Emergency Medicine | Admitting: Emergency Medicine

## 2020-01-27 ENCOUNTER — Other Ambulatory Visit: Payer: Self-pay

## 2020-01-27 ENCOUNTER — Emergency Department: Payer: Medicare HMO

## 2020-01-27 DIAGNOSIS — W19XXXA Unspecified fall, initial encounter: Secondary | ICD-10-CM | POA: Diagnosis not present

## 2020-01-27 DIAGNOSIS — I2510 Atherosclerotic heart disease of native coronary artery without angina pectoris: Secondary | ICD-10-CM | POA: Diagnosis not present

## 2020-01-27 DIAGNOSIS — Z7901 Long term (current) use of anticoagulants: Secondary | ICD-10-CM | POA: Insufficient documentation

## 2020-01-27 DIAGNOSIS — I10 Essential (primary) hypertension: Secondary | ICD-10-CM | POA: Diagnosis not present

## 2020-01-27 DIAGNOSIS — Z955 Presence of coronary angioplasty implant and graft: Secondary | ICD-10-CM | POA: Diagnosis not present

## 2020-01-27 DIAGNOSIS — R7303 Prediabetes: Secondary | ICD-10-CM | POA: Diagnosis not present

## 2020-01-27 DIAGNOSIS — S2231XA Fracture of one rib, right side, initial encounter for closed fracture: Secondary | ICD-10-CM | POA: Insufficient documentation

## 2020-01-27 DIAGNOSIS — W228XXA Striking against or struck by other objects, initial encounter: Secondary | ICD-10-CM | POA: Insufficient documentation

## 2020-01-27 DIAGNOSIS — Z8673 Personal history of transient ischemic attack (TIA), and cerebral infarction without residual deficits: Secondary | ICD-10-CM | POA: Diagnosis not present

## 2020-01-27 DIAGNOSIS — Z79899 Other long term (current) drug therapy: Secondary | ICD-10-CM | POA: Insufficient documentation

## 2020-01-27 DIAGNOSIS — S299XXA Unspecified injury of thorax, initial encounter: Secondary | ICD-10-CM | POA: Diagnosis present

## 2020-01-27 MED ORDER — OXYCODONE-ACETAMINOPHEN 5-325 MG PO TABS: 1.0000 | ORAL_TABLET | Freq: Three times a day (TID) | ORAL | 0 refills | Status: DC | PRN

## 2020-01-27 MED ORDER — OXYCODONE-ACETAMINOPHEN 5-325 MG PO TABS
1.0000 | ORAL_TABLET | Freq: Once | ORAL | Status: AC
  Administered 2020-01-27: 1 via ORAL
  Filled 2020-01-27: qty 1

## 2020-01-27 MED ORDER — LIDOCAINE 4 % EX PTCH: 1.0000 | MEDICATED_PATCH | Freq: Every day | CUTANEOUS | 0 refills | Status: DC

## 2020-01-27 MED ORDER — LIDOCAINE 5 % EX PTCH
1.0000 | MEDICATED_PATCH | CUTANEOUS | Status: DC
  Administered 2020-01-27: 1 via TRANSDERMAL
  Filled 2020-01-27: qty 1

## 2020-01-27 NOTE — ED Triage Notes (Signed)
Pt states on Friday she was opening the attic door and the ladder came down knocking her down, states then later that day she tripped taking trash out and fell. Pt c/o right rib/chest pain with movement and deep breathing.

## 2020-01-27 NOTE — Discharge Instructions (Addendum)
Please follow up with primary care in about a week.  Go sooner if your pain medication is not controlling your pain or if you have other symptoms of concern.  Return to the ER for symptoms that change or worsen if unable to schedule an appointment.

## 2020-01-27 NOTE — ED Notes (Signed)
Patient instructed on incentive spirometer use.

## 2020-01-27 NOTE — ED Notes (Signed)
Please call sister when roomed. Claris Che [phone]

## 2020-01-27 NOTE — ED Provider Notes (Signed)
[facility] Emergency Department Provider Note ____________________________________________  Time seen: Approximately 4:45 PM  I have reviewed the triage vital signs and the nursing notes.   HISTORY  Chief Complaint Fall and Rib Injury    HPI DELINDA BENVENUTO is a 86 y.o. female who presents to the emergency department for evaluation and treatment of right rib and chest pain with movement and deep breathing after the attic steps hit her in the head and in the right breast, then the same day, she fell while taking out the trash. She lives alone and typically doesn't have any trouble performing ADLs, but had a rough day on Friday. She denies striking her head or loss of consciousness. She has had no vision changes, confusion, nausea, vomiting or headache.   Past Medical History:  Diagnosis Date  . A-fib ([place]) 06/07/2014  . Arm fracture 03/10/2017   right  . CAD (coronary artery disease) 06/07/2014  . Closed right radial fracture 03/21/2017  . Depression 09/24/2015  . Dyslipidemia 08/16/2016  . Dysphonia 08/16/2016  . Hypertension   . Mild aortic stenosis 08/22/2016   Echo July 2018  . Mitral regurgitation 08/22/2016   Echo July 2018  . Stroke [facility])     Patient Active Problem List   Diagnosis Date Noted  . Dysarthria 05/08/2019  . Prediabetes 05/08/2019  . History of placement of stent in LAD coronary artery 05/08/2019  . S/P right coronary artery (RCA) stent placement 05/08/2019  . Moderate episode of recurrent major depressive disorder ([place]) 11/19/2018  . History of ischemic stroke 11/19/2018  . Osteoporosis 03/21/2017  . Mild aortic stenosis 08/22/2016  . Mitral regurgitation 08/22/2016  . Dysphonia 08/16/2016  . Dyslipidemia 08/16/2016  . GERD (gastroesophageal reflux disease) 11/24/2015  . Fatigue 09/22/2015  . HTN (hypertension) 06/07/2014  . Cerebral infarction ([place]) 06/07/2014  . A-fib ([place]) 06/07/2014  . CAD (coronary artery disease) 06/07/2014   . Hearing loss 10/21/2011    Past Surgical History:  Procedure Laterality Date  . ABDOMINAL HYSTERECTOMY    . APPENDECTOMY    . CHOLECYSTECTOMY    . FRACTURE SURGERY      Prior to Admission medications   Medication Sig Start Date End Date Taking? Authorizing Provider  Lidocaine 4 % PTCH Apply 1 patch topically daily. 01/27/20  Yes Cordero Surette B, FNP  oxyCODONE-acetaminophen (PERCOCET) 5-325 MG tablet Take 1 tablet by mouth every 8 (eight) hours as needed for severe pain. 01/27/20 01/26/21 Yes Kitai Purdom B, FNP  apixaban (ELIQUIS) 5 MG TABS tablet Take 1 tablet (5 mg total) by mouth 2 (two) times daily. 06/08/14   Bettey Costa, MD  atorvastatin (LIPITOR) 80 MG tablet Take 1 tablet (80 mg total) by mouth at bedtime. 05/08/19   Delsa Grana, PA-C  Cholecalciferol (VITAMIN D-1000 MAX ST) 1000 units tablet Take 1,000 Units by mouth daily.    [provider]  ezetimibe (ZETIA) 10 MG tablet Take 1 tablet (10 mg total) by mouth daily. 05/08/19   Delsa Grana, PA-C  famotidine (PEPCID) 20 MG tablet Take 1 tablet (20 mg total) by mouth 2 (two) times daily as needed for heartburn or indigestion. 05/08/19   Delsa Grana, PA-C  FLUoxetine (PROZAC) 40 MG capsule Take 1 capsule (40 mg total) by mouth daily. 05/08/19   Delsa Grana, PA-C  isosorbide mononitrate (IMDUR) 60 MG 24 hr tablet Take 1 tablet (60 mg total) by mouth daily. 05/09/19   Delsa Grana, PA-C  lisinopril-hydrochlorothiazide (ZESTORETIC) 20-12.5 MG tablet Take 1 tablet  by mouth daily. 05/09/19   Danelle Berry, PA-C  metoprolol succinate (TOPROL-XL) 50 MG 24 hr tablet Take 1 tablet (50 mg total) by mouth daily. Take with or immediately following a meal. 05/08/19   Danelle Berry, PA-C  Multiple Vitamin (MULTIVITAMIN) tablet Take 1 tablet by mouth daily.    [provider]    Allergies Iodine and Levaquin [levofloxacin]  Family History  Problem Relation Age of Onset  . Stroke Mother   . Heart attack Father   . Heart disease  Sister   . Diabetes Sister   . Heart disease Brother   . Heart attack Brother   . Diabetes Maternal Grandfather   . Ovarian cancer Paternal Grandmother   . Cancer Brother        bladder cancer  . Diabetes Brother   . Heart disease Brother   . Hypertension Brother   . Heart attack Daughter     Social History Social History   Tobacco Use  . Smoking status: Never Smoker  . Smokeless tobacco: Never Used  Vaping Use  . Vaping Use: Never used  Substance Use Topics  . Alcohol use: No  . Drug use: No    Review of Systems Constitutional: Negative for fever. Cardiovascular: Negative for chest pain. Respiratory: Negative for shortness of breath. Musculoskeletal: Positive for right rib pain. Skin: Negative for open wound  Neurological: Negative for decrease in sensation  ____________________________________________   PHYSICAL EXAM:  VITAL SIGNS: ED Triage Vitals  Enc Vitals Group     BP 01/27/20 1343 109/72     Pulse Rate 01/27/20 1343 84     Resp 01/27/20 1343 20     Temp 01/27/20 1343 98.6 F (37 C)     Temp Source 01/27/20 1343 Oral     SpO2 01/27/20 1343 95 %     Weight 01/27/20 1344 139 lb (63 kg)     Height 01/27/20 1344 5' (1.524 m)     Head Circumference --      Peak Flow --      Pain Score 01/27/20 1343 10     Pain Loc --      Pain Edu? --      Excl. in GC? --     Constitutional: Alert and oriented. Well appearing and in no acute distress. Eyes: Conjunctivae are clear without discharge or drainage Head: Atraumatic Neck: Supple. No midline tenderness. Respiratory: No cough. Respirations are even and unlabored. Musculoskeletal: Focal tenderness over right lateral rib/breast area. Neurologic: Awake, alert, oriented x 4.  Skin: Small contusion on forehead between eyebrows, abrasion on right forearm.  Psychiatric: Affect and behavior are appropriate.  ____________________________________________   LABS (all labs ordered are listed, but only abnormal  results are displayed)  Labs Reviewed - No data to display ____________________________________________  RADIOLOGY  Image of the right ribs and chest are negative for acute findings per radiology.  I, Kem Boroughs, personally viewed and evaluated these images (plain radiographs) as part of my medical decision making, as well as reviewing the written report by the radiologist.  DG Ribs Unilateral W/Chest Right  Result Date: 01/27/2020 CLINICAL DATA:  Right rib pain after fall. EXAM: RIGHT RIBS AND CHEST - 3+ VIEW COMPARISON:  August 18, 2016. FINDINGS: No fracture or other bone lesions are seen involving the ribs. There is no evidence of pneumothorax or pleural effusion. Both lungs are clear. Heart size and mediastinal contours are within normal limits. IMPRESSION: Negative. Electronically Signed   By: Roque Lias  Jr M.D.   On: 01/27/2020 14:34   ____________________________________________   PROCEDURES  Procedures  ____________________________________________   INITIAL IMPRESSION / ASSESSMENT AND PLAN / ED COURSE  CYRE CASSAR is a 86 y.o. who presents to the emergency department for treatment and evaluation of right-sided rib pain after injury on Friday.  See HPI for further details.  Patient is completely awake, alert, and oriented.  She has had no sign of head injury or intracranial hemorrhage.  Injury occurred on Friday therefore it is unlikely that there would be any acute findings on CT of her head.  She has no neck pain and is having no midline tenderness on exam.  No CT images will be obtained today.  Image of the right ribs and chest does not show a displaced rib fracture.  Clinically, I do feel that she has a rib fracture around probably the fourth, fifth, or 6 rib.  There is no flail segment.  She is maintaining an oxygen saturation of 95% on room air.  She is not tachycardic or short of breath.  Patient is experiencing obvious significant pain with movement of the right  arm and with deep breath.  Plan will be to treat her with Percocet and lidocaine patches.  She is to follow-up with her primary care provider in about a week she is to make sure she has not developed any sign of pneumonia.  She will also be sent home with an incentive spirometer with instructions to use it every couple hours while awake.  Patient states that she plans on staying with her sister who lives about a block away from her for the next few days until she begins to feel better and feels more confident that she can manage by herself.  Strict ER return precautions were discussed.  Medications  lidocaine (LIDODERM) 5 % 1 patch (1 patch Transdermal Patch Applied 01/27/20 1801)  oxyCODONE-acetaminophen (PERCOCET/ROXICET) 5-325 MG per tablet 1 tablet (1 tablet Oral Given 01/27/20 1801)    Pertinent labs & imaging results that were available during my care of the patient were reviewed by me and considered in my medical decision making (see chart for details).   _________________________________________   FINAL CLINICAL IMPRESSION(S) / ED DIAGNOSES  Final diagnoses:  Closed fracture of one rib of right side, initial encounter    ED Discharge Orders         Ordered    oxyCODONE-acetaminophen (PERCOCET) 5-325 MG tablet  Every 8 hours PRN        01/27/20 1712    Lidocaine 4 % PTCH  Daily        01/27/20 1712           If controlled substance prescribed during this visit, 12 month history viewed on the [place] prior to issuing an initial prescription for Schedule II or III opiod.   Victorino Dike, FNP 01/27/20 1807    Blake Divine, MD 01/27/20 Joen Laura

## 2020-05-29 ENCOUNTER — Other Ambulatory Visit: Payer: Self-pay | Admitting: Family Medicine

## 2020-05-29 DIAGNOSIS — I4891 Unspecified atrial fibrillation: Secondary | ICD-10-CM

## 2020-05-29 NOTE — Telephone Encounter (Signed)
Pt needs appt for further refills. 

## 2020-05-29 NOTE — Telephone Encounter (Signed)
Pt needs a medrefill appt before 30 days from 05-29-2020

## 2020-06-18 ENCOUNTER — Ambulatory Visit: Payer: Medicare HMO | Admitting: Family Medicine

## 2020-06-23 ENCOUNTER — Ambulatory Visit (INDEPENDENT_AMBULATORY_CARE_PROVIDER_SITE_OTHER): Payer: Medicare HMO | Admitting: Unknown Physician Specialty

## 2020-06-23 ENCOUNTER — Encounter: Payer: Self-pay | Admitting: Unknown Physician Specialty

## 2020-06-23 ENCOUNTER — Other Ambulatory Visit: Payer: Self-pay

## 2020-06-23 VITALS — BP 118/70 | HR 96 | Temp 98.8°F | Resp 16 | Ht 60.0 in | Wt 141.6 lb

## 2020-06-23 DIAGNOSIS — E785 Hyperlipidemia, unspecified: Secondary | ICD-10-CM | POA: Diagnosis not present

## 2020-06-23 DIAGNOSIS — G4710 Hypersomnia, unspecified: Secondary | ICD-10-CM | POA: Diagnosis not present

## 2020-06-23 DIAGNOSIS — R410 Disorientation, unspecified: Secondary | ICD-10-CM

## 2020-06-23 DIAGNOSIS — R519 Headache, unspecified: Secondary | ICD-10-CM

## 2020-06-23 MED ORDER — FLUOXETINE HCL 20 MG PO CAPS: 20.0000 mg | ORAL_CAPSULE | Freq: Every day | ORAL | 3 refills | Status: DC

## 2020-06-23 NOTE — Progress Notes (Signed)
BP 118/70   Pulse 96   Temp 98.8 F (37.1 C) (Oral)   Resp 16   Ht 5' (1.524 m)   Wt 141 lb 9.6 oz (64.2 kg)   SpO2 97%   BMI 27.65 kg/m    Subjective:    Patient ID: Samantha [name], female    DOB: 15-Feb-1928, 87 y.o.   MRN: [id number]  HPI: Samantha [name] is a 87 y.o. female  Chief Complaint  Patient presents with  . Hypertension  . Hypothyroidism  . Depression    Follow up, medication refills   Pt has multiple concerns today.  States that she has an intense feeling on a specific spot on the left side of the head which feels like an intense pressure.  I comes mostly at night and stays for about 5-10 minutes and sometimes longer.  Pt states that she had a scan in the past which shows a "black spot" in that same area.    Pt states she sleeps all day and night except between 12 and 3A.  Yesterday didn't know what day it was when she woke up and was very confused.  She is with her sister who says she does have episodes of confusion.    Relevant past medical, surgical, family and social history reviewed and updated as indicated. Interim medical history since our last visit reviewed. Allergies and medications reviewed and updated.  Review of Systems  Per HPI unless specifically indicated above     Objective:    BP 118/70   Pulse 96   Temp 98.8 F (37.1 C) (Oral)   Resp 16   Ht 5' (1.524 m)   Wt 141 lb 9.6 oz (64.2 kg)   SpO2 97%   BMI 27.65 kg/m   Wt Readings from Last 3 Encounters:  06/23/20 141 lb 9.6 oz (64.2 kg)  01/27/20 139 lb (63 kg)  06/24/19 145 lb (65.8 kg)    Physical Exam Constitutional:      General: She is not in acute distress.    Appearance: Normal appearance. She is well-developed.  HENT:     Head: Normocephalic and atraumatic.  Eyes:     General: Lids are normal. No scleral icterus.       Right eye: No discharge.        Left eye: No discharge.     Conjunctiva/sclera: Conjunctivae normal.  Cardiovascular:     Rate and Rhythm: Normal  rate.  Pulmonary:     Effort: Pulmonary effort is normal.  Abdominal:     Palpations: There is no hepatomegaly or splenomegaly.  Musculoskeletal:        General: Normal range of motion.  Skin:    Coloration: Skin is not pale.     Findings: No rash.  Neurological:     Mental Status: She is alert and oriented to person, place, and time.  Psychiatric:        Behavior: Behavior normal.        Thought Content: Thought content normal.        Judgment: Judgment normal.     Results for orders placed or performed in visit on 05/08/19  CBC with Differential/Platelet  Result Value Ref Range   WBC 6.8 3.8 - 10.8 Thousand/uL   RBC 4.61 3.80 - 5.10 Million/uL   Hemoglobin 14.3 11.7 - 15.5 g/dL   HCT 43.6 35.0 - 45.0 %   MCV 94.6 80.0 - 100.0 fL   MCH 31.0 27.0 - 33.0 pg  MCHC 32.8 32.0 - 36.0 g/dL   RDW 12.4 11.0 - 15.0 %   Platelets 280 140 - 400 Thousand/uL   MPV 10.4 7.5 - 12.5 fL   Neutro Abs 3,896 1,500 - 7,800 cells/uL   Lymphs Abs 2,006 850 - 3,900 cells/uL   Absolute Monocytes 660 200 - 950 cells/uL   Eosinophils Absolute 218 15 - 500 cells/uL   Basophils Absolute 20 0 - 200 cells/uL   Neutrophils Relative % 57.3 %   Total Lymphocyte 29.5 %   Monocytes Relative 9.7 %   Eosinophils Relative 3.2 %   Basophils Relative 0.3 %  COMPLETE METABOLIC PANEL WITH GFR  Result Value Ref Range   Glucose, Bld 95 65 - 99 mg/dL   BUN 14 7 - 25 mg/dL   Creat 0.85 0.60 - 0.88 mg/dL   GFR, Est Non African American 61 > OR = 60 mL/min/1.22m   GFR, Est African American 71 > OR = 60 mL/min/1.757m  BUN/Creatinine Ratio NOT APPLICABLE 6 - 22 (calc)   Sodium 141 135 - 146 mmol/L   Potassium 3.7 3.5 - 5.3 mmol/L   Chloride 101 98 - 110 mmol/L   CO2 33 (H) 20 - 32 mmol/L   Calcium 9.9 8.6 - 10.4 mg/dL   Total Protein 6.4 6.1 - 8.1 g/dL   Albumin 3.8 3.6 - 5.1 g/dL   Globulin 2.6 1.9 - 3.7 g/dL (calc)   AG Ratio 1.5 1.0 - 2.5 (calc)   Total Bilirubin 0.6 0.2 - 1.2 mg/dL   Alkaline  phosphatase (APISO) 87 37 - 153 U/L   AST 24 10 - 35 U/L   ALT 21 6 - 29 U/L  Lipid panel  Result Value Ref Range   Cholesterol 156 <200 mg/dL   HDL 35 (L) > OR = 50 mg/dL   Triglycerides 187 (H) <150 mg/dL   LDL Cholesterol (Calc) 93 mg/dL (calc)   Total CHOL/HDL Ratio 4.5 <5.0 (calc)   Non-HDL Cholesterol (Calc) 121 <130 mg/dL (calc)  Hemoglobin A1c  Result Value Ref Range   Hgb A1c MFr Bld 5.7 (H) <5.7 % of total Hgb   Mean Plasma Glucose 117 (calc)   eAG (mmol/L) 6.5 (calc)      Assessment & Plan:   Problem List Items Addressed This Visit   None   Visit Diagnoses    Hypersomnia    -  Primary   Pt states she is sleeping most of the time.  Will decrease Prozac from 40 mg-20 mg.     Relevant Orders   CT Head Wo Contrast   Comprehensive metabolic panel   CBC with Differential/Platelet   Lipid panel   Sed Rate (ESR)   C-reactive protein   TSH   Nonintractable headache, unspecified chronicity pattern, unspecified headache type       No specific complaints besides hypersomnia and episodic confusion.Hx is notable for anti-coagulants and falls.  Will order CT scan   Relevant Medications   FLUoxetine (PROZAC) 20 MG capsule   Other Relevant Orders   CT Head Wo Contrast   Comprehensive metabolic panel   CBC with Differential/Platelet   Lipid panel   Sed Rate (ESR)   C-reactive protein   TSH   Confusion       Unclear onset.  No focal symptoms.  Order labs and head CT.  Consider MRI   Relevant Orders   CT Head Wo Contrast   Comprehensive metabolic panel   CBC with Differential/Platelet   Lipid panel  Sed Rate (ESR)   C-reactive protein   TSH       Follow up plan: Return in about 2 weeks (around 07/07/2020).

## 2020-06-24 LAB — COMPREHENSIVE METABOLIC PANEL
AG Ratio: 1.4 (calc) (ref 1.0–2.5)
ALT: 16 U/L (ref 6–29)
AST: 20 U/L (ref 10–35)
Albumin: 3.8 g/dL (ref 3.6–5.1)
Alkaline phosphatase (APISO): 100 U/L (ref 37–153)
BUN/Creatinine Ratio: 16 (calc) (ref 6–22)
BUN: 15 mg/dL (ref 7–25)
CO2: 31 mmol/L (ref 20–32)
Calcium: 10 mg/dL (ref 8.6–10.4)
Chloride: 104 mmol/L (ref 98–110)
Creat: 0.91 mg/dL — ABNORMAL HIGH (ref 0.60–0.88)
Globulin: 2.7 g/dL (calc) (ref 1.9–3.7)
Glucose, Bld: 100 mg/dL — ABNORMAL HIGH (ref 65–99)
Potassium: 4.4 mmol/L (ref 3.5–5.3)
Sodium: 142 mmol/L (ref 135–146)
Total Bilirubin: 0.4 mg/dL (ref 0.2–1.2)
Total Protein: 6.5 g/dL (ref 6.1–8.1)

## 2020-06-24 LAB — CBC WITH DIFFERENTIAL/PLATELET
Absolute Monocytes: 714 cells/uL (ref 200–950)
Basophils Absolute: 27 cells/uL (ref 0–200)
Basophils Relative: 0.4 %
Eosinophils Absolute: 204 cells/uL (ref 15–500)
Eosinophils Relative: 3 %
HCT: 39.2 % (ref 35.0–45.0)
Hemoglobin: 12.9 g/dL (ref 11.7–15.5)
Lymphs Abs: 1693 cells/uL (ref 850–3900)
MCH: 30.8 pg (ref 27.0–33.0)
MCHC: 32.9 g/dL (ref 32.0–36.0)
MCV: 93.6 fL (ref 80.0–100.0)
MPV: 10.7 fL (ref 7.5–12.5)
Monocytes Relative: 10.5 %
Neutro Abs: 4162 cells/uL (ref 1500–7800)
Neutrophils Relative %: 61.2 %
Platelets: 266 10*3/uL (ref 140–400)
RBC: 4.19 10*6/uL (ref 3.80–5.10)
RDW: 13.5 % (ref 11.0–15.0)
Total Lymphocyte: 24.9 %
WBC: 6.8 10*3/uL (ref 3.8–10.8)

## 2020-06-24 LAB — LIPID PANEL
Cholesterol: 179 mg/dL (ref <200)
HDL: 35 mg/dL — ABNORMAL LOW (ref >=50)
LDL Cholesterol (Calc): 94 mg/dL (calc)
Non-HDL Cholesterol (Calc): 144 mg/dL (calc) — ABNORMAL HIGH (ref <130)
Total CHOL/HDL Ratio: 5.1 (calc) — ABNORMAL HIGH (ref <5.0)
Triglycerides: 377 mg/dL — ABNORMAL HIGH (ref <150)

## 2020-06-24 LAB — TSH: TSH: 2.98 mIU/L (ref 0.40–4.50)

## 2020-06-24 LAB — SEDIMENTATION RATE: Sed Rate: 53 mm/h — ABNORMAL HIGH (ref 0–30)

## 2020-06-24 LAB — C-REACTIVE PROTEIN: CRP: 5.4 mg/L (ref <8.0)

## 2020-06-25 ENCOUNTER — Other Ambulatory Visit: Payer: Self-pay | Admitting: Family Medicine

## 2020-06-25 DIAGNOSIS — I4891 Unspecified atrial fibrillation: Secondary | ICD-10-CM

## 2020-06-26 ENCOUNTER — Telehealth: Payer: Self-pay | Admitting: Family Medicine

## 2020-06-26 NOTE — Telephone Encounter (Signed)
Pt is calling to check on the status of her referral for MRI/CAT Scan/Imaging Please advise CB- [phone]

## 2020-06-30 ENCOUNTER — Other Ambulatory Visit: Payer: Self-pay | Admitting: Family Medicine

## 2020-06-30 DIAGNOSIS — I25111 Atherosclerotic heart disease of native coronary artery with angina pectoris with documented spasm: Secondary | ICD-10-CM

## 2020-06-30 DIAGNOSIS — I4891 Unspecified atrial fibrillation: Secondary | ICD-10-CM

## 2020-06-30 DIAGNOSIS — I10 Essential (primary) hypertension: Secondary | ICD-10-CM

## 2020-06-30 NOTE — Telephone Encounter (Signed)
Hospital will call patient with referral date and time. Patient notified

## 2020-06-30 NOTE — Telephone Encounter (Signed)
You saw here 5/24

## 2020-07-07 ENCOUNTER — Other Ambulatory Visit: Payer: Self-pay

## 2020-07-07 ENCOUNTER — Encounter: Payer: Self-pay | Admitting: Unknown Physician Specialty

## 2020-07-07 ENCOUNTER — Ambulatory Visit (INDEPENDENT_AMBULATORY_CARE_PROVIDER_SITE_OTHER): Payer: Medicare HMO | Admitting: Unknown Physician Specialty

## 2020-07-07 VITALS — BP 112/70 | HR 93 | Temp 98.3°F | Resp 14 | Ht 60.0 in | Wt 140.0 lb

## 2020-07-07 DIAGNOSIS — R700 Elevated erythrocyte sedimentation rate: Secondary | ICD-10-CM | POA: Diagnosis not present

## 2020-07-07 DIAGNOSIS — G4710 Hypersomnia, unspecified: Secondary | ICD-10-CM

## 2020-07-07 DIAGNOSIS — R519 Headache, unspecified: Secondary | ICD-10-CM

## 2020-07-07 HISTORY — DX: Elevated erythrocyte sedimentation rate: R70.0

## 2020-07-07 LAB — SEDIMENTATION RATE: Sed Rate: 50 mm/h — ABNORMAL HIGH (ref 0–30)

## 2020-07-07 NOTE — Assessment & Plan Note (Signed)
Check ESR again

## 2020-07-07 NOTE — Progress Notes (Signed)
BP 112/70   Pulse 93   Temp 98.3 F (36.8 C) (Oral)   Resp 14   Ht 5' (1.524 m)   Wt 140 lb (63.5 kg)   SpO2 98%   BMI 27.34 kg/m    Subjective:    Patient ID: Samantha [name], female    DOB: 1928-12-03, 87 y.o.   MRN: [id number]  HPI: Samantha [name] is a 87 y.o. female  Chief Complaint  Patient presents with  . Follow-up    2 week recheck   Hypersomnia Last visit I decreased her Fluoxetine from 40 mg to 20 mg.  Still sleeping all the time.  Last night went to bed at 8p and woke at 5 A, went back to sleep and didn't wake up until 12N.  No adverse effects from decreasing Fluoxetine.  Also noted an elevated sed rate last visit  Depression screen [facility] 2/9 07/07/2020 06/23/2020 10/22/2019 05/08/2019 11/19/2018  Decreased Interest 0 1 1 1  0  Down, Depressed, Hopeless 0 1 1 3  0  PHQ - 2 Score 0 2 2 4  0  Altered sleeping 0 1 1 1  0  Tired, decreased energy 0 1 0 3 0  Change in appetite 0 1 0 1 0  Feeling bad or failure about yourself  0 1 0 0 0  Trouble concentrating 0 1 0 0 0  Moving slowly or fidgety/restless 0 1 0 0 0  Suicidal thoughts 0 0 0 0 0  PHQ-9 Score 0 8 3 9  0  Difficult doing work/chores Not difficult at all Somewhat difficult Not difficult at all Somewhat difficult Not difficult at all  Some recent data might be hidden     Relevant past medical, surgical, family and social history reviewed and updated as indicated. Interim medical history since our last visit reviewed. Allergies and medications reviewed and updated.  Review of Systems  Per HPI unless specifically indicated above     Objective:    BP 112/70   Pulse 93   Temp 98.3 F (36.8 C) (Oral)   Resp 14   Ht 5' (1.524 m)   Wt 140 lb (63.5 kg)   SpO2 98%   BMI 27.34 kg/m   Wt Readings from Last 3 Encounters:  07/07/20 140 lb (63.5 kg)  06/23/20 141 lb 9.6 oz (64.2 kg)  01/27/20 139 lb (63 kg)    Physical Exam Constitutional:      General: She is not in acute distress.    Appearance: Normal  appearance. She is well-developed.  HENT:     Head: Normocephalic and atraumatic.  Eyes:     General: Lids are normal. No scleral icterus.       Right eye: No discharge.        Left eye: No discharge.     Conjunctiva/sclera: Conjunctivae normal.  Neck:     Vascular: No carotid bruit or JVD.  Cardiovascular:     Rate and Rhythm: Normal rate and regular rhythm.     Heart sounds: Normal heart sounds.  Pulmonary:     Effort: Pulmonary effort is normal.     Breath sounds: Normal breath sounds.  Abdominal:     Palpations: There is no hepatomegaly or splenomegaly.  Musculoskeletal:        General: Normal range of motion.     Cervical back: Normal range of motion and neck supple.  Skin:    General: Skin is warm and dry.     Coloration: Skin is not pale.  Findings: No rash.  Neurological:     Mental Status: She is alert and oriented to person, place, and time.  Psychiatric:        Behavior: Behavior normal.        Thought Content: Thought content normal.        Judgment: Judgment normal.     Results for orders placed or performed in visit on 06/23/20  Comprehensive metabolic panel  Result Value Ref Range   Glucose, Bld 100 (H) 65 - 99 mg/dL   BUN 15 7 - 25 mg/dL   Creat 0.91 (H) 0.60 - 0.88 mg/dL   BUN/Creatinine Ratio 16 6 - 22 (calc)   Sodium 142 135 - 146 mmol/L   Potassium 4.4 3.5 - 5.3 mmol/L   Chloride 104 98 - 110 mmol/L   CO2 31 20 - 32 mmol/L   Calcium 10.0 8.6 - 10.4 mg/dL   Total Protein 6.5 6.1 - 8.1 g/dL   Albumin 3.8 3.6 - 5.1 g/dL   Globulin 2.7 1.9 - 3.7 g/dL (calc)   AG Ratio 1.4 1.0 - 2.5 (calc)   Total Bilirubin 0.4 0.2 - 1.2 mg/dL   Alkaline phosphatase (APISO) 100 37 - 153 U/L   AST 20 10 - 35 U/L   ALT 16 6 - 29 U/L  CBC with Differential/Platelet  Result Value Ref Range   WBC 6.8 3.8 - 10.8 Thousand/uL   RBC 4.19 3.80 - 5.10 Million/uL   Hemoglobin 12.9 11.7 - 15.5 g/dL   HCT 39.2 35.0 - 45.0 %   MCV 93.6 80.0 - 100.0 fL   MCH 30.8 27.0 -  33.0 pg   MCHC 32.9 32.0 - 36.0 g/dL   RDW 13.5 11.0 - 15.0 %   Platelets 266 140 - 400 Thousand/uL   MPV 10.7 7.5 - 12.5 fL   Neutro Abs 4,162 1,500 - 7,800 cells/uL   Lymphs Abs 1,693 850 - 3,900 cells/uL   Absolute Monocytes 714 200 - 950 cells/uL   Eosinophils Absolute 204 15 - 500 cells/uL   Basophils Absolute 27 0 - 200 cells/uL   Neutrophils Relative % 61.2 %   Total Lymphocyte 24.9 %   Monocytes Relative 10.5 %   Eosinophils Relative 3.0 %   Basophils Relative 0.4 %  Lipid panel  Result Value Ref Range   Cholesterol 179 <200 mg/dL   HDL 35 (L) > OR = 50 mg/dL   Triglycerides 377 (H) <150 mg/dL   LDL Cholesterol (Calc) 94 mg/dL (calc)   Total CHOL/HDL Ratio 5.1 (H) <5.0 (calc)   Non-HDL Cholesterol (Calc) 144 (H) <130 mg/dL (calc)  Sed Rate (ESR)  Result Value Ref Range   Sed Rate 53 (H) 0 - 30 mm/h  C-reactive protein  Result Value Ref Range   CRP 5.4 <8.0 mg/L  TSH  Result Value Ref Range   TSH 2.98 0.40 - 4.50 mIU/L      Assessment & Plan:   Problem List Items Addressed This Visit      Unprioritized   Elevated sed rate    Check ESR again       Other Visit Diagnoses    Nonintractable headache, unspecified chronicity pattern, unspecified headache type    -  Primary   Relevant Orders   Sed Rate (ESR)   Hypersomnia       No change with Prozac decrease.  Recheck ESR and await head CT       Follow up plan: Return if symptoms worsen or fail  to improve.

## 2020-07-13 ENCOUNTER — Telehealth: Payer: Self-pay

## 2020-07-13 NOTE — Telephone Encounter (Signed)
Copied from [place] [id number]. Topic: General - Other >> Jul 13, 2020  1:06 PM Keene Breath wrote: Reason for CRM: Patient called to check the status of her referral for a CAT Scan.  She stated she still has not heard anything yet.  Patient said her head has been hurting her and she needs to get this test.  Please advise   Informed patient that per the referral coordinator we are waiting for authorization from patient's insurance company.  Patient verbalized understanding.

## 2020-07-15 ENCOUNTER — Telehealth: Payer: Self-pay | Admitting: Unknown Physician Specialty

## 2020-07-15 DIAGNOSIS — E785 Hyperlipidemia, unspecified: Secondary | ICD-10-CM

## 2020-07-15 NOTE — Telephone Encounter (Signed)
Patient called to request the medication for atorvastatin that she is totally out.  Please advise.

## 2020-07-15 NOTE — Telephone Encounter (Signed)
Future visit in 3 months  

## 2020-07-15 NOTE — Telephone Encounter (Signed)
Patient is calling again in reference to her referral.  She has not heard anything from the office and is in a lot of pain.  Please call patient to explain the process.   CB# [phone]

## 2020-07-16 NOTE — Telephone Encounter (Signed)
Sent in

## 2020-07-22 ENCOUNTER — Other Ambulatory Visit: Payer: Self-pay | Admitting: Unknown Physician Specialty

## 2020-07-22 DIAGNOSIS — I4891 Unspecified atrial fibrillation: Secondary | ICD-10-CM

## 2020-07-29 ENCOUNTER — Ambulatory Visit
Admission: RE | Admit: 2020-07-29 | Discharge: 2020-07-29 | Disposition: A | Discharge status: Home / Self Care | Payer: Medicare HMO | Source: Ambulatory Visit | Attending: Unknown Physician Specialty | Admitting: Unknown Physician Specialty

## 2020-07-29 ENCOUNTER — Other Ambulatory Visit: Payer: Self-pay

## 2020-07-29 DIAGNOSIS — R410 Disorientation, unspecified: Secondary | ICD-10-CM | POA: Diagnosis not present

## 2020-07-29 DIAGNOSIS — G4710 Hypersomnia, unspecified: Secondary | ICD-10-CM | POA: Insufficient documentation

## 2020-07-29 DIAGNOSIS — R519 Headache, unspecified: Secondary | ICD-10-CM | POA: Diagnosis not present

## 2020-08-19 ENCOUNTER — Other Ambulatory Visit: Payer: Self-pay | Admitting: Unknown Physician Specialty

## 2020-08-19 DIAGNOSIS — I4891 Unspecified atrial fibrillation: Secondary | ICD-10-CM

## 2020-08-19 NOTE — Telephone Encounter (Signed)
Future visit in 2 months  

## 2020-09-07 ENCOUNTER — Other Ambulatory Visit: Payer: Self-pay | Admitting: Unknown Physician Specialty

## 2020-09-07 DIAGNOSIS — I10 Essential (primary) hypertension: Secondary | ICD-10-CM

## 2020-09-15 ENCOUNTER — Other Ambulatory Visit: Payer: Self-pay | Admitting: Unknown Physician Specialty

## 2020-09-15 DIAGNOSIS — I4891 Unspecified atrial fibrillation: Secondary | ICD-10-CM

## 2020-09-25 ENCOUNTER — Other Ambulatory Visit: Payer: Self-pay | Admitting: Unknown Physician Specialty

## 2020-09-25 DIAGNOSIS — I25111 Atherosclerotic heart disease of native coronary artery with angina pectoris with documented spasm: Secondary | ICD-10-CM

## 2020-09-25 DIAGNOSIS — I4891 Unspecified atrial fibrillation: Secondary | ICD-10-CM

## 2020-10-12 ENCOUNTER — Other Ambulatory Visit: Payer: Self-pay | Admitting: Unknown Physician Specialty

## 2020-10-12 DIAGNOSIS — E785 Hyperlipidemia, unspecified: Secondary | ICD-10-CM

## 2020-10-12 DIAGNOSIS — I4891 Unspecified atrial fibrillation: Secondary | ICD-10-CM

## 2020-10-22 ENCOUNTER — Ambulatory Visit (INDEPENDENT_AMBULATORY_CARE_PROVIDER_SITE_OTHER): Payer: Medicare HMO

## 2020-10-22 DIAGNOSIS — Z0000 Encounter for general adult medical examination without abnormal findings: Secondary | ICD-10-CM | POA: Diagnosis not present

## 2020-10-22 NOTE — Progress Notes (Signed)
Subjective:   Samantha [name] is a 87 y.o. female who presents for Medicare Annual (Subsequent) preventive examination.  Virtual Visit via Telephone Note  I connected with  Samantha [name] on 10/22/20 at  9:20 AM EDT by telephone and verified that I am speaking with the correct person using two identifiers.  Location: Patient: home Provider: [place] Persons participating in the virtual visit: [place]   I discussed the limitations, risks, security and privacy concerns of performing an evaluation and management service by telephone and the availability of in person appointments. The patient expressed understanding and agreed to proceed.  Interactive audio and video telecommunications were attempted between this nurse and patient, however failed, due to patient having technical difficulties OR patient did not have access to video capability.  We continued and completed visit with audio only.  Some vital signs may be absent or patient reported.   Clemetine Marker, LPN   Review of Systems     Cardiac Risk Factors include: advanced age (>39men, >81 women);dyslipidemia;hypertension;sedentary lifestyle     Objective:    There were no vitals filed for this visit. There is no height or weight on file to calculate BMI.  Advanced Directives 10/22/2020 01/27/2020 10/22/2019 03/04/2017 08/26/2016 08/18/2016 08/18/2016  Does Patient Have a Medical Advance Directive? No No No No Yes Yes Yes  Type of Advance Directive - - - - Catering manager -  Does patient want to make changes to medical advance directive? - - - - - No - Patient declined No - Patient declined  Copy of [place] in Chart? - - - - No - copy requested No - copy requested -  Would patient like information on creating a medical advance directive? Yes (MAU/Ambulatory/Procedural Areas - Information given) No - Patient declined Yes (MAU/Ambulatory/Procedural Areas -  Information given) No - Patient declined No - Patient declined - No - Patient declined    Current Medications (verified) Outpatient Encounter Medications as of 10/22/2020  Medication Sig   apixaban (ELIQUIS) 5 MG TABS tablet Take 1 tablet (5 mg total) by mouth 2 (two) times daily.   atorvastatin (LIPITOR) 80 MG tablet Take 1 tablet (80 mg total) by mouth at bedtime.   Cholecalciferol 25 MCG (1000 UT) tablet Take 1,000 Units by mouth daily.   famotidine (PEPCID) 20 MG tablet Take 1 tablet (20 mg total) by mouth 2 (two) times daily as needed for heartburn or indigestion.   FLUoxetine (PROZAC) 20 MG capsule Take 1 capsule (20 mg total) by mouth daily.   isosorbide mononitrate (IMDUR) 60 MG 24 hr tablet Take 1 tablet (60 mg total) by mouth daily.   lisinopril-hydrochlorothiazide (ZESTORETIC) 20-12.5 MG tablet Take 1 tablet by mouth daily.   metoprolol succinate (TOPROL-XL) 50 MG 24 hr tablet Take 1 tablet (50 mg total) by mouth daily. Take with or immediately following a meal.   Multiple Vitamin (MULTIVITAMIN) tablet Take 1 tablet by mouth daily.   [DISCONTINUED] Lidocaine 4 % PTCH Apply 1 patch topically daily.   [DISCONTINUED] oxyCODONE-acetaminophen (PERCOCET) 5-325 MG tablet Take 1 tablet by mouth every 8 (eight) hours as needed for severe pain.   No facility-administered encounter medications on file as of 10/22/2020.    Allergies (verified) Iodine and Levaquin [levofloxacin]   History: Past Medical History:  Diagnosis Date   A-fib ([place]) 06/07/2014   Arm fracture 03/10/2017   right   CAD (coronary artery disease) 06/07/2014   Closed right  radial fracture 03/21/2017   Depression 09/24/2015   Dyslipidemia 08/16/2016   Dysphonia 08/16/2016   Hypertension    Mild aortic stenosis 08/22/2016   Echo July 2018   Mitral regurgitation 08/22/2016   Echo July 2018   Stroke [facility])    Past Surgical History:  Procedure Laterality Date   ABDOMINAL HYSTERECTOMY     APPENDECTOMY     CHOLECYSTECTOMY      FRACTURE SURGERY     Family History  Problem Relation Age of Onset   Stroke Mother    Heart attack Father    Heart disease Sister    Diabetes Sister    Heart disease Brother    Heart attack Brother    Diabetes Maternal Grandfather    Ovarian cancer Paternal Grandmother    Cancer Brother        bladder cancer   Diabetes Brother    Heart disease Brother    Hypertension Brother    Heart attack Daughter    Social History   Socioeconomic History   Marital status: Widowed    Spouse name: Not on file   Number of children: 3   Years of education: Not on file   Highest education level: Not on file  Occupational History   Not on file  Tobacco Use   Smoking status: Never   Smokeless tobacco: Never  Vaping Use   Vaping Use: Never used  Substance and Sexual Activity   Alcohol use: No   Drug use: No   Sexual activity: Never  Other Topics Concern   Not on file  Social History Narrative   Only has 1 living child left   Social Determinants of Health   Financial Resource Strain: Low Risk    Difficulty of Paying Living Expenses: Not very hard  Food Insecurity: No Food Insecurity   Worried About Charity fundraiser in the Last Year: Never true   Ran Out of Food in the Last Year: Never true  Transportation Needs: Unmet Transportation Needs   Lack of Transportation (Medical): Yes   Lack of Transportation (Non-Medical): Yes  Physical Activity: Inactive   Days of Exercise per Week: 0 days   Minutes of Exercise per Session: 0 min  Stress: No Stress Concern Present   Feeling of Stress : Not at all  Social Connections: Moderately Isolated   Frequency of Communication with Friends and Family: More than three times a week   Frequency of Social Gatherings with Friends and Family: Three times a week   Attends Religious Services: More than 4 times per year   Active Member of Clubs or Organizations: No   Attends Archivist Meetings: Never   Marital Status: Widowed     Tobacco Counseling Counseling given: Not Answered   Clinical Intake:  Pre-visit preparation completed: Yes  Pain : No/denies pain     Nutritional Risks: None Diabetes: No  How often do you need to have someone help you when you read instructions, pamphlets, or other written materials from your doctor or pharmacy?: 1 - Never    Interpreter Needed?: No  Information entered by :: Clemetine Marker LPN   Activities of Daily Living In your present state of health, do you have any difficulty performing the following activities: 10/22/2020 07/07/2020  Hearing? Y N  Vision? N N  Difficulty concentrating or making decisions? N Y  Walking or climbing stairs? N Y  Dressing or bathing? N N  Doing errands, shopping? N Y  Conservation officer, nature and  eating ? N -  Using the Toilet? N -  In the past six months, have you accidently leaked urine? N -  Do you have problems with loss of bowel control? N -  Managing your Medications? N -  Managing your Finances? N -  Housekeeping or managing your Housekeeping? N -  Some recent data might be hidden    Patient Care Team: Delsa Grana, PA-C as PCP - General (Family Medicine) [place], [place], [place] as Social Worker  Indicate any recent Medical Services you may have received from other than [facility] providers in the past year (date may be approximate).     Assessment:   This is a routine wellness examination for Samantha [name].  Hearing/Vision screen Hearing Screening - Comments:: Pt wears hearing aids maintained by [place] Vision Screening - Comments:: Annual vision screenings at [facility]  Dietary issues and exercise activities discussed: Current Exercise Habits: The patient does not participate in regular exercise at present, Exercise limited by: orthopedic condition(s)   Goals Addressed   None    Depression Screen PHQ 2/9 Scores 10/22/2020 07/07/2020 06/23/2020 10/22/2019 05/08/2019 11/19/2018 02/27/2018  PHQ - 2 Score 0 0 2 2 4  0 1  PHQ- 9  Score 4 0 8 3 9  0 -    Fall Risk Fall Risk  10/22/2020 07/07/2020 06/23/2020 10/22/2019 05/08/2019  Falls in the past year? 1 1 1  0 0  Number falls in past yr: 0 1 1 0 0  Injury with Fall? 1 1 1  0 0  Comment - - - - -  Risk Factor Category  - - - - -  Risk for fall due to : History of fall(s) History of fall(s);Impaired balance/gait History of fall(s);Impaired balance/gait Impaired balance/gait Other (Comment)  Follow up Falls prevention discussed Falls evaluation completed Falls evaluation completed Falls prevention discussed Falls evaluation completed    FALL RISK PREVENTION PERTAINING TO THE HOME:  Any stairs in or around the home? Yes  If so, are there any without handrails? No  Home free of loose throw rugs in walkways, pet beds, electrical cords, etc? Yes  Adequate lighting in your home to reduce risk of falls? Yes   ASSISTIVE DEVICES UTILIZED TO PREVENT FALLS:  Life alert? No  Use of a cane, walker or w/c? No  Grab bars in the bathroom? Yes  Shower chair or bench in shower? Yes  Elevated toilet seat or a handicapped toilet? No   TIMED UP AND GO:  Was the test performed? No . Telephonic visit.   Cognitive Function: Normal cognitive status assessed by direct observation by this Nurse Health Advisor. No abnormalities found.       6CIT Screen 10/22/2019  What Year? 0 points  What month? 0 points  What time? 0 points  Count back from 20 0 points  Months in reverse 0 points  Repeat phrase 0 points  Total Score 0    Immunizations Immunization History  Administered Date(s) Administered   Influenza, High Dose Seasonal PF 11/16/2018   Influenza-Unspecified 10/24/2011, 11/22/2012, 10/25/2013, 11/20/2015, 11/17/2016, 11/20/2017   Moderna Sars-Covid-2 Vaccination 03/15/2019, 04/12/2019, 12/05/2019   Pneumococcal Conjugate-13 11/20/2017   Pneumococcal Polysaccharide-23 01/31/2010   Tdap 10/24/2011    TDAP status: Up to date  Flu Vaccine status: Due, Education has been  provided regarding the importance of this vaccine. Advised may receive this vaccine at local pharmacy or Health Dept. Aware to provide a copy of the vaccination record if obtained from local pharmacy or Health Dept.  Verbalized acceptance and understanding.  Pneumococcal vaccine status: Up to date  Covid-19 vaccine status: Completed vaccines  Qualifies for Shingles Vaccine? Yes   Zostavax completed Yes  - per patient Shingrix Completed?: No.    Education has been provided regarding the importance of this vaccine. Patient has been advised to call insurance company to determine out of pocket expense if they have not yet received this vaccine. Advised may also receive vaccine at local pharmacy or Health Dept. Verbalized acceptance and understanding.  Screening Tests Health Maintenance  Topic Date Due   Zoster Vaccines- Shingrix (1 of 2) Never done   COVID-19 Vaccine (4 - Booster for Moderna series) 04/03/2020   INFLUENZA VACCINE  08/31/2020   TETANUS/TDAP  02/01/2023   DEXA SCAN  Addressed   HPV VACCINES  Aged Out    Health Maintenance  Health Maintenance Due  Topic Date Due   Zoster Vaccines- Shingrix (1 of 2) Never done   COVID-19 Vaccine (4 - Booster for Moderna series) 04/03/2020   INFLUENZA VACCINE  08/31/2020    Colorectal cancer screening: No longer required.   Mammogram status: No longer required due to age.  Bone density status: no longer required due to age  Lung Cancer Screening: (Low Dose CT Chest recommended if Age 22-80 years, 30 pack-year currently smoking OR have quit w/in 15years.) does not qualify.   Additional Screening:  Hepatitis C Screening: does not qualify  Vision Screening: Recommended annual ophthalmology exams for early detection of glaucoma and other disorders of the eye. Is the patient up to date with their annual eye exam?  Yes  Who is the provider or what is the name of the office in which the patient attends annual eye exams? [facility].   Dental Screening: Recommended annual dental exams for proper oral hygiene  Community Resource Referral / Chronic Care Management: CRR required this visit?  No   CCM required this visit?  No      Plan:     I have personally reviewed and noted the following in the patient's chart:   Medical and social history Use of alcohol, tobacco or illicit drugs  Current medications and supplements including opioid prescriptions.  Functional ability and status Nutritional status Physical activity Advanced directives List of other physicians Hospitalizations, surgeries, and ER visits in previous 12 months Vitals Screenings to include cognitive, depression, and falls Referrals and appointments  In addition, I have reviewed and discussed with patient certain preventive protocols, quality metrics, and best practice recommendations. A written personalized care plan for preventive services as well as general preventive health recommendations were provided to patient.     Clemetine Marker, LPN   08/04/8887   Nurse Notes: pt c/o daytime sleepiness and feels more easily worn out. Pt attributes to age and doesn't sleep well at night. She tried melatonin per recommendation after last visit and felt too groggy. Pt advised to discuss with PCP.

## 2020-10-22 NOTE — Patient Instructions (Signed)
Samantha [name] , Thank you for taking time to come for your Medicare Wellness Visit. I appreciate your ongoing commitment to your health goals. Please review the following plan we discussed and let me know if I can assist you in the future.   Screening recommendations/referrals: Colonoscopy: no longer required Mammogram: no longer required Bone Density: no longer required Recommended yearly ophthalmology/optometry visit for glaucoma screening and checkup Recommended yearly dental visit for hygiene and checkup  Vaccinations: Influenza vaccine: due Pneumococcal vaccine: done 11/20/17 Tdap vaccine: done 10/24/11 Shingles vaccine: Shingrix discussed. Please contact your pharmacy for coverage information.  Covid-19:done 03/15/19, 04/12/19 & 12/05/19  Advanced directives: Advance directive discussed with you today. I have provided a copy for you to complete at home and have notarized. Once this is complete please bring a copy in to our office so we can scan it into your chart.   Conditions/risks identified: Recommend continuing fall prevention in the home  Next appointment: Follow up in one year for your annual wellness visit    Preventive Care 65 Years and Older, Female Preventive care refers to lifestyle choices and visits with your health care provider that can promote health and wellness. What does preventive care include? A yearly physical exam. This is also called an annual well check. Dental exams once or twice a year. Routine eye exams. Ask your health care provider how often you should have your eyes checked. Personal lifestyle choices, including: Daily care of your teeth and gums. Regular physical activity. Eating a healthy diet. Avoiding tobacco and drug use. Limiting alcohol use. Practicing safe sex. Taking low-dose aspirin every day. Taking vitamin and mineral supplements as recommended by your health care provider. What happens during an annual well check? The services and  screenings done by your health care provider during your annual well check will depend on your age, overall health, lifestyle risk factors, and family history of disease. Counseling  Your health care provider may ask you questions about your: Alcohol use. Tobacco use. Drug use. Emotional well-being. Home and relationship well-being. Sexual activity. Eating habits. History of falls. Memory and ability to understand (cognition). Work and work Statistician. Reproductive health. Screening  You may have the following tests or measurements: Height, weight, and BMI. Blood pressure. Lipid and cholesterol levels. These may be checked every 5 years, or more frequently if you are over 56 years old. Skin check. Lung cancer screening. You may have this screening every year starting at age 44 if you have a 30-pack-year history of smoking and currently smoke or have quit within the past 15 years. Fecal occult blood test (FOBT) of the stool. You may have this test every year starting at age 10. Flexible sigmoidoscopy or colonoscopy. You may have a sigmoidoscopy every 5 years or a colonoscopy every 10 years starting at age 94. Hepatitis C blood test. Hepatitis B blood test. Sexually transmitted disease (STD) testing. Diabetes screening. This is done by checking your blood sugar (glucose) after you have not eaten for a while (fasting). You may have this done every 1-3 years. Bone density scan. This is done to screen for osteoporosis. You may have this done starting at age 44. Mammogram. This may be done every 1-2 years. Talk to your health care provider about how often you should have regular mammograms. Talk with your health care provider about your test results, treatment options, and if necessary, the need for more tests. Vaccines  Your health care provider may recommend certain vaccines, such as: Influenza vaccine. This  is recommended every year. Tetanus, diphtheria, and acellular pertussis (Tdap,  Td) vaccine. You may need a Td booster every 10 years. Zoster vaccine. You may need this after age 17. Pneumococcal 13-valent conjugate (PCV13) vaccine. One dose is recommended after age 67. Pneumococcal polysaccharide (PPSV23) vaccine. One dose is recommended after age 55. Talk to your health care provider about which screenings and vaccines you need and how often you need them. This information is not intended to replace advice given to you by your health care provider. Make sure you discuss any questions you have with your health care provider. Document Released: 02/13/2015 Document Revised: 10/07/2015 Document Reviewed: 11/18/2014 Elsevier Interactive Patient Education  2017 [place] Prevention in the Home Falls can cause injuries. They can happen to people of all ages. There are many things you can do to make your home safe and to help prevent falls. What can I do on the outside of my home? Regularly fix the edges of walkways and driveways and fix any cracks. Remove anything that might make you trip as you walk through a door, such as a raised step or threshold. Trim any bushes or trees on the path to your home. Use bright outdoor lighting. Clear any walking paths of anything that might make someone trip, such as rocks or tools. Regularly check to see if handrails are loose or broken. Make sure that both [place] of any steps have handrails. Any raised decks and porches should have guardrails on the edges. Have any leaves, snow, or ice cleared regularly. Use sand or salt on walking paths during winter. Clean up any spills in your garage right away. This includes oil or grease spills. What can I do in the bathroom? Use night lights. Install grab bars by the toilet and in the tub and shower. Do not use towel bars as grab bars. Use non-skid mats or decals in the tub or shower. If you need to sit down in the shower, use a plastic, non-slip stool. Keep the floor dry. Clean up any  water that spills on the floor as soon as it happens. Remove soap buildup in the tub or shower regularly. Attach bath mats securely with double-sided non-slip rug tape. Do not have throw rugs and other things on the floor that can make you trip. What can I do in the bedroom? Use night lights. Make sure that you have a light by your bed that is easy to reach. Do not use any sheets or blankets that are too big for your bed. They should not hang down onto the floor. Have a firm chair that has side arms. You can use this for support while you get dressed. Do not have throw rugs and other things on the floor that can make you trip. What can I do in the kitchen? Clean up any spills right away. Avoid walking on wet floors. Keep items that you use a lot in easy-to-reach places. If you need to reach something above you, use a strong step stool that has a grab bar. Keep electrical cords out of the way. Do not use floor polish or wax that makes floors slippery. If you must use wax, use non-skid floor wax. Do not have throw rugs and other things on the floor that can make you trip. What can I do with my stairs? Do not leave any items on the stairs. Make sure that there are handrails on both [place] of the stairs and use them. Fix handrails that  are broken or loose. Make sure that handrails are as long as the stairways. Check any carpeting to make sure that it is firmly attached to the stairs. Fix any carpet that is loose or worn. Avoid having throw rugs at the top or bottom of the stairs. If you do have throw rugs, attach them to the floor with carpet tape. Make sure that you have a light switch at the top of the stairs and the bottom of the stairs. If you do not have them, ask someone to add them for you. What else can I do to help prevent falls? Wear shoes that: Do not have high heels. Have rubber bottoms. Are comfortable and fit you well. Are closed at the toe. Do not wear sandals. If you use a  stepladder: Make sure that it is fully opened. Do not climb a closed stepladder. Make sure that both [place] of the stepladder are locked into place. Ask someone to hold it for you, if possible. Clearly mark and make sure that you can see: Any grab bars or handrails. First and last steps. Where the edge of each step is. Use tools that help you move around (mobility aids) if they are needed. These include: Canes. Walkers. Scooters. Crutches. Turn on the lights when you go into a dark area. Replace any light bulbs as soon as they burn out. Set up your furniture so you have a clear path. Avoid moving your furniture around. If any of your floors are uneven, fix them. If there are any pets around you, be aware of where they are. Review your medicines with your doctor. Some medicines can make you feel dizzy. This can increase your chance of falling. Ask your doctor what other things that you can do to help prevent falls. This information is not intended to replace advice given to you by your health care provider. Make sure you discuss any questions you have with your health care provider. Document Released: 11/13/2008 Document Revised: 06/25/2015 Document Reviewed: 02/21/2014 Elsevier Interactive Patient Education  2017 [employer].

## 2020-12-14 ENCOUNTER — Other Ambulatory Visit: Payer: Self-pay | Admitting: Unknown Physician Specialty

## 2020-12-14 DIAGNOSIS — I10 Essential (primary) hypertension: Secondary | ICD-10-CM

## 2020-12-14 NOTE — Telephone Encounter (Signed)
Requested medications are due for refill today yes  Requested medications are on the active medication list yes  Last refill 8/8  Last visit 05/2019 Had a medicare wellness visit with LPN 04/863)  Future visit scheduled 10/26/21 (medicare wellness visit with LPN)  Notes to clinic Failed protocol due to no valid visit within 6  months, please assess. Requested Prescriptions  Pending Prescriptions Disp Refills   lisinopril-hydrochlorothiazide (ZESTORETIC) 20-12.5 MG tablet [Pharmacy Med Name: LISINOPRIL-HCTZ 20-12.5 MG TAB] 100 tablet 0    Sig: Take 1 tablet by mouth daily.     Cardiovascular:  ACEI + Diuretic Combos Failed - 12/14/2020 12:15 PM      Failed - Cr in normal range and within 180 days    Creat  Date Value Ref Range Status  06/23/2020 0.91 (H) 0.60 - 0.88 mg/dL Final    Comment:    For patients >61 years of age, the reference limit for Creatinine is approximately 13% higher for people identified as African-American. .           Passed - Na in normal range and within 180 days    Sodium  Date Value Ref Range Status  06/23/2020 142 135 - 146 mmol/L Final  04/04/2013 138 136 - 145 mmol/L Final          Passed - K in normal range and within 180 days    Potassium  Date Value Ref Range Status  06/23/2020 4.4 3.5 - 5.3 mmol/L Final  04/04/2013 4.3 3.5 - 5.1 mmol/L Final          Passed - Ca in normal range and within 180 days    Calcium  Date Value Ref Range Status  06/23/2020 10.0 8.6 - 10.4 mg/dL Final   Calcium, Total  Date Value Ref Range Status  04/04/2013 9.0 8.5 - 10.1 mg/dL Final          Passed - Patient is not pregnant      Passed - Last BP in normal range    BP Readings from Last 1 Encounters:  07/07/20 112/70          Passed - Valid encounter within last 6 months    Recent Outpatient Visits           5 months ago Nonintractable headache, unspecified chronicity pattern, unspecified headache type   [place], NP   5 months ago Hypersomnia   [place], NP   1 year ago Essential hypertension   [facility] Delsa Grana, PA-C   2 years ago Essential hypertension   [place], Astrid Divine, [place]   2 years ago Erroneous encounter - disregard   [place], NP       Future Appointments             In 10 months [facility], [facility]

## 2020-12-21 ENCOUNTER — Other Ambulatory Visit: Payer: Self-pay | Admitting: Unknown Physician Specialty

## 2020-12-21 DIAGNOSIS — I4891 Unspecified atrial fibrillation: Secondary | ICD-10-CM

## 2020-12-21 DIAGNOSIS — I25111 Atherosclerotic heart disease of native coronary artery with angina pectoris with documented spasm: Secondary | ICD-10-CM

## 2020-12-21 NOTE — Telephone Encounter (Signed)
Requested Prescriptions  Pending Prescriptions Disp Refills  . isosorbide mononitrate (IMDUR) 60 MG 24 hr tablet [Pharmacy Med Name: ISOSORBIDE MONONIT ER 60 MG TB] 90 tablet 0    Sig: Take 1 tablet (60 mg total) by mouth daily.     Cardiovascular:  Nitrates Passed - 12/21/2020 12:42 PM      Passed - Last BP in normal range    BP Readings from Last 1 Encounters:  07/07/20 112/70         Passed - Last Heart Rate in normal range    Pulse Readings from Last 1 Encounters:  07/07/20 93         Passed - Valid encounter within last 12 months    Recent Outpatient Visits          5 months ago Nonintractable headache, unspecified chronicity pattern, unspecified headache type   [place], NP   6 months ago Hypersomnia   [place], NP   1 year ago Essential hypertension   [facility] Delsa Grana, PA-C   2 years ago Essential hypertension   [place], [place]   2 years ago Erroneous encounter - disregard   [place], NP      Future Appointments            In 10 months [facility], [facility]

## 2020-12-22 NOTE — Telephone Encounter (Signed)
Lvm that medication refilled for 30 day supply and that the patient needs to schedule a follow up for For HTN, anticoagulant monitoring, cholesterol per provider

## 2021-01-07 ENCOUNTER — Other Ambulatory Visit: Payer: Self-pay | Admitting: Unknown Physician Specialty

## 2021-01-07 DIAGNOSIS — I4891 Unspecified atrial fibrillation: Secondary | ICD-10-CM

## 2021-01-07 NOTE — Telephone Encounter (Signed)
Requested Prescriptions  Pending Prescriptions Disp Refills  . metoprolol succinate (TOPROL-XL) 50 MG 24 hr tablet [Pharmacy Med Name: METOPROLOL SUCC ER 50 MG TAB] 90 tablet 0    Sig: Take 1 tablet (50 mg total) by mouth daily. Take with or immediately following a meal.     Cardiovascular:  Beta Blockers Passed - 01/07/2021  9:38 AM      Passed - Last BP in normal range    BP Readings from Last 1 Encounters:  07/07/20 112/70         Passed - Last Heart Rate in normal range    Pulse Readings from Last 1 Encounters:  07/07/20 93         Passed - Valid encounter within last 6 months    Recent Outpatient Visits          6 months ago Nonintractable headache, unspecified chronicity pattern, unspecified headache type   [place], NP   6 months ago Hypersomnia   [place], NP   1 year ago Essential hypertension   [facility] Delsa Grana, PA-C   2 years ago Essential hypertension   [place], FNP   2 years ago Erroneous encounter - disregard   [place], NP      Future Appointments            In 79 months [facility], [facility] [place]

## 2021-01-18 ENCOUNTER — Other Ambulatory Visit: Payer: Self-pay | Admitting: Unknown Physician Specialty

## 2021-01-18 DIAGNOSIS — I10 Essential (primary) hypertension: Secondary | ICD-10-CM

## 2021-01-18 NOTE — Telephone Encounter (Signed)
Requested medication (s) are due for refill today: yes  Requested medication (s) are on the active medication list: yes  Last refill:  12/21/20  Future visit scheduled: no  Notes to clinic:  Has already had a curtesy refill and there is no upcoming appointment scheduled.  Requested Prescriptions  Pending Prescriptions Disp Refills   lisinopril-hydrochlorothiazide (ZESTORETIC) 20-12.5 MG tablet [Pharmacy Med Name: LISINOPRIL-HCTZ 20-12.5 MG TAB] 30 tablet 0    Sig: Take 1 tablet by mouth daily.     Cardiovascular:  ACEI + Diuretic Combos Failed - 01/18/2021 12:16 PM      Failed - Na in normal range and within 180 days    Sodium  Date Value Ref Range Status  06/23/2020 142 135 - 146 mmol/L Final  04/04/2013 138 136 - 145 mmol/L Final          Failed - K in normal range and within 180 days    Potassium  Date Value Ref Range Status  06/23/2020 4.4 3.5 - 5.3 mmol/L Final  04/04/2013 4.3 3.5 - 5.1 mmol/L Final          Failed - Cr in normal range and within 180 days    Creat  Date Value Ref Range Status  06/23/2020 0.91 (H) 0.60 - 0.88 mg/dL Final    Comment:    For patients >35 years of age, the reference limit for Creatinine is approximately 13% higher for people identified as African-American. .           Failed - Ca in normal range and within 180 days    Calcium  Date Value Ref Range Status  06/23/2020 10.0 8.6 - 10.4 mg/dL Final   Calcium, Total  Date Value Ref Range Status  04/04/2013 9.0 8.5 - 10.1 mg/dL Final          Passed - Patient is not pregnant      Passed - Last BP in normal range    BP Readings from Last 1 Encounters:  07/07/20 112/70          Passed - Valid encounter within last 6 months    Recent Outpatient Visits           6 months ago Nonintractable headache, unspecified chronicity pattern, unspecified headache type   [place], NP   6 months ago Hypersomnia   [place], NP   1 year ago Essential hypertension   [facility] Delsa Grana, PA-C   2 years ago Essential hypertension   [place], [place]   2 years ago Erroneous encounter - disregard   [place], NP       Future Appointments             In 72 months [facility], [facility]

## 2021-01-20 NOTE — Telephone Encounter (Signed)
Lvm to sch appt in order to obtain refills

## 2021-01-22 ENCOUNTER — Other Ambulatory Visit: Payer: Self-pay | Admitting: Unknown Physician Specialty

## 2021-01-22 DIAGNOSIS — I10 Essential (primary) hypertension: Secondary | ICD-10-CM

## 2021-01-22 NOTE — Telephone Encounter (Signed)
Future visit in 1 month  Requested Prescriptions  Pending Prescriptions Disp Refills   lisinopril-hydrochlorothiazide (ZESTORETIC) 20-12.5 MG tablet [Pharmacy Med Name: LISINOPRIL-HCTZ 20-12.5 MG TAB] 30 tablet 0    Sig: Take 1 tablet by mouth daily.     Cardiovascular:  ACEI + Diuretic Combos Failed - 01/22/2021 12:34 PM      Failed - Na in normal range and within 180 days    Sodium  Date Value Ref Range Status  06/23/2020 142 135 - 146 mmol/L Final  04/04/2013 138 136 - 145 mmol/L Final         Failed - K in normal range and within 180 days    Potassium  Date Value Ref Range Status  06/23/2020 4.4 3.5 - 5.3 mmol/L Final  04/04/2013 4.3 3.5 - 5.1 mmol/L Final         Failed - Cr in normal range and within 180 days    Creat  Date Value Ref Range Status  06/23/2020 0.91 (H) 0.60 - 0.88 mg/dL Final    Comment:    For patients >60 years of age, the reference limit for Creatinine is approximately 13% higher for people identified as African-American. .          Failed - Ca in normal range and within 180 days    Calcium  Date Value Ref Range Status  06/23/2020 10.0 8.6 - 10.4 mg/dL Final   Calcium, Total  Date Value Ref Range Status  04/04/2013 9.0 8.5 - 10.1 mg/dL Final         Passed - Patient is not pregnant      Passed - Last BP in normal range    BP Readings from Last 1 Encounters:  07/07/20 112/70         Passed - Valid encounter within last 6 months    Recent Outpatient Visits          6 months ago Nonintractable headache, unspecified chronicity pattern, unspecified headache type   [place], NP   7 months ago Hypersomnia   [place], NP   1 year ago Essential hypertension   [facility] Delsa Grana, PA-C   2 years ago Essential hypertension   [place], [place], [place]   2 years ago Erroneous encounter - disregard   [place], NP      Future Appointments            In 1 month [facility], [facility], [place]   In 9 months  [facility], [facility]

## 2021-02-14 ENCOUNTER — Inpatient Hospital Stay
Admission: EM | Admit: 2021-02-14 | Discharge: 2021-02-16 | DRG: 641 | Disposition: A | Discharge status: Home Health | Payer: Medicare HMO | Attending: Internal Medicine | Admitting: Internal Medicine

## 2021-02-14 ENCOUNTER — Emergency Department: Payer: Medicare HMO

## 2021-02-14 ENCOUNTER — Encounter: Payer: Self-pay | Admitting: Emergency Medicine

## 2021-02-14 DIAGNOSIS — J9811 Atelectasis: Secondary | ICD-10-CM | POA: Diagnosis not present

## 2021-02-14 DIAGNOSIS — Z888 Allergy status to other drugs, medicaments and biological substances status: Secondary | ICD-10-CM

## 2021-02-14 DIAGNOSIS — I10 Essential (primary) hypertension: Secondary | ICD-10-CM | POA: Diagnosis not present

## 2021-02-14 DIAGNOSIS — Z20822 Contact with and (suspected) exposure to covid-19: Secondary | ICD-10-CM | POA: Diagnosis present

## 2021-02-14 DIAGNOSIS — H9192 Unspecified hearing loss, left ear: Secondary | ICD-10-CM | POA: Diagnosis present

## 2021-02-14 DIAGNOSIS — I480 Paroxysmal atrial fibrillation: Secondary | ICD-10-CM | POA: Diagnosis present

## 2021-02-14 DIAGNOSIS — W19XXXA Unspecified fall, initial encounter: Secondary | ICD-10-CM

## 2021-02-14 DIAGNOSIS — Z8052 Family history of malignant neoplasm of bladder: Secondary | ICD-10-CM

## 2021-02-14 DIAGNOSIS — Z90710 Acquired absence of both cervix and uterus: Secondary | ICD-10-CM

## 2021-02-14 DIAGNOSIS — Z8041 Family history of malignant neoplasm of ovary: Secondary | ICD-10-CM | POA: Diagnosis not present

## 2021-02-14 DIAGNOSIS — Z6828 Body mass index (BMI) 28.0-28.9, adult: Secondary | ICD-10-CM

## 2021-02-14 DIAGNOSIS — S2231XA Fracture of one rib, right side, initial encounter for closed fracture: Secondary | ICD-10-CM | POA: Diagnosis present

## 2021-02-14 DIAGNOSIS — M47812 Spondylosis without myelopathy or radiculopathy, cervical region: Secondary | ICD-10-CM | POA: Diagnosis present

## 2021-02-14 DIAGNOSIS — Z8249 Family history of ischemic heart disease and other diseases of the circulatory system: Secondary | ICD-10-CM

## 2021-02-14 DIAGNOSIS — E785 Hyperlipidemia, unspecified: Secondary | ICD-10-CM | POA: Diagnosis present

## 2021-02-14 DIAGNOSIS — R627 Adult failure to thrive: Secondary | ICD-10-CM

## 2021-02-14 DIAGNOSIS — Z823 Family history of stroke: Secondary | ICD-10-CM

## 2021-02-14 DIAGNOSIS — S1093XA Contusion of unspecified part of neck, initial encounter: Secondary | ICD-10-CM | POA: Diagnosis not present

## 2021-02-14 DIAGNOSIS — S0083XA Contusion of other part of head, initial encounter: Secondary | ICD-10-CM | POA: Diagnosis not present

## 2021-02-14 DIAGNOSIS — Z881 Allergy status to other antibiotic agents status: Secondary | ICD-10-CM | POA: Diagnosis not present

## 2021-02-14 DIAGNOSIS — Z7901 Long term (current) use of anticoagulants: Secondary | ICD-10-CM | POA: Diagnosis not present

## 2021-02-14 DIAGNOSIS — R69 Illness, unspecified: Secondary | ICD-10-CM | POA: Diagnosis not present

## 2021-02-14 DIAGNOSIS — I2510 Atherosclerotic heart disease of native coronary artery without angina pectoris: Secondary | ICD-10-CM | POA: Diagnosis present

## 2021-02-14 DIAGNOSIS — F32A Depression, unspecified: Secondary | ICD-10-CM | POA: Diagnosis present

## 2021-02-14 DIAGNOSIS — W1830XA Fall on same level, unspecified, initial encounter: Secondary | ICD-10-CM | POA: Diagnosis present

## 2021-02-14 DIAGNOSIS — K219 Gastro-esophageal reflux disease without esophagitis: Secondary | ICD-10-CM | POA: Diagnosis present

## 2021-02-14 DIAGNOSIS — S2232XA Fracture of one rib, left side, initial encounter for closed fracture: Secondary | ICD-10-CM | POA: Diagnosis not present

## 2021-02-14 DIAGNOSIS — S8011XA Contusion of right lower leg, initial encounter: Secondary | ICD-10-CM | POA: Diagnosis present

## 2021-02-14 DIAGNOSIS — I700 Atherosclerosis of aorta: Secondary | ICD-10-CM | POA: Diagnosis not present

## 2021-02-14 DIAGNOSIS — Z833 Family history of diabetes mellitus: Secondary | ICD-10-CM

## 2021-02-14 DIAGNOSIS — Z8673 Personal history of transient ischemic attack (TIA), and cerebral infarction without residual deficits: Secondary | ICD-10-CM | POA: Diagnosis not present

## 2021-02-14 DIAGNOSIS — I4891 Unspecified atrial fibrillation: Secondary | ICD-10-CM

## 2021-02-14 DIAGNOSIS — G47419 Narcolepsy without cataplexy: Secondary | ICD-10-CM | POA: Diagnosis present

## 2021-02-14 DIAGNOSIS — R531 Weakness: Secondary | ICD-10-CM | POA: Diagnosis not present

## 2021-02-14 HISTORY — DX: Adult failure to thrive: R62.7

## 2021-02-14 LAB — URINALYSIS, ROUTINE W REFLEX MICROSCOPIC
Bilirubin Urine: NEGATIVE
Glucose, UA: NEGATIVE mg/dL
Hgb urine dipstick: NEGATIVE
Ketones, ur: NEGATIVE mg/dL
Leukocytes,Ua: NEGATIVE
Nitrite: NEGATIVE
Protein, ur: NEGATIVE mg/dL
Specific Gravity, Urine: 1.025 (ref 1.005–1.030)
pH: 5 (ref 5.0–8.0)

## 2021-02-14 LAB — CBC WITH DIFFERENTIAL/PLATELET
Abs Immature Granulocytes: 0.01 10*3/uL (ref 0.00–0.07)
Basophils Absolute: 0 10*3/uL (ref 0.0–0.1)
Basophils Relative: 0 %
Eosinophils Absolute: 0.2 10*3/uL (ref 0.0–0.5)
Eosinophils Relative: 4 %
HCT: 44 % (ref 36.0–46.0)
Hemoglobin: 14.4 g/dL (ref 12.0–15.0)
Immature Granulocytes: 0 %
Lymphocytes Relative: 40 %
Lymphs Abs: 2.6 10*3/uL (ref 0.7–4.0)
MCH: 31.1 pg (ref 26.0–34.0)
MCHC: 32.7 g/dL (ref 30.0–36.0)
MCV: 95 fL (ref 80.0–100.0)
Monocytes Absolute: 0.6 10*3/uL (ref 0.1–1.0)
Monocytes Relative: 9 %
Neutro Abs: 3 10*3/uL (ref 1.7–7.7)
Neutrophils Relative %: 47 %
Platelets: 264 10*3/uL (ref 150–400)
RBC: 4.63 MIL/uL (ref 3.87–5.11)
RDW: 12.8 % (ref 11.5–15.5)
WBC: 6.5 10*3/uL (ref 4.0–10.5)
nRBC: 0 % (ref 0.0–0.2)

## 2021-02-14 LAB — COMPREHENSIVE METABOLIC PANEL
ALT: 20 U/L (ref 0–44)
AST: 26 U/L (ref 15–41)
Albumin: 3.4 g/dL — ABNORMAL LOW (ref 3.5–5.0)
Alkaline Phosphatase: 104 U/L (ref 38–126)
Anion gap: 11 (ref 5–15)
BUN: 15 mg/dL (ref 8–23)
CO2: 28 mmol/L (ref 22–32)
Calcium: 9.4 mg/dL (ref 8.9–10.3)
Chloride: 102 mmol/L (ref 98–111)
Creatinine, Ser: 0.87 mg/dL (ref 0.44–1.00)
GFR, Estimated: >60 mL/min (ref >60)
Glucose, Bld: 119 mg/dL — ABNORMAL HIGH (ref 70–99)
Potassium: 4.3 mmol/L (ref 3.5–5.1)
Sodium: 141 mmol/L (ref 135–145)
Total Bilirubin: 1 mg/dL (ref 0.3–1.2)
Total Protein: 7.1 g/dL (ref 6.5–8.1)

## 2021-02-14 LAB — CK: Total CK: 51 U/L (ref 38–234)

## 2021-02-14 LAB — RESP PANEL BY RT-PCR (FLU A&B, COVID) ARPGX2
Influenza A by PCR: NEGATIVE
Influenza B by PCR: NEGATIVE
SARS Coronavirus 2 by RT PCR: NEGATIVE

## 2021-02-14 MED ORDER — APIXABAN 5 MG PO TABS
5.0000 mg | ORAL_TABLET | Freq: Two times a day (BID) | ORAL | Status: DC
  Administered 2021-02-15 – 2021-02-16 (×3): 5 mg via ORAL
  Filled 2021-02-14 (×3): qty 1

## 2021-02-14 MED ORDER — ACETAMINOPHEN 500 MG PO TABS
1000.0000 mg | ORAL_TABLET | Freq: Once | ORAL | Status: AC
  Administered 2021-02-14: 1000 mg via ORAL
  Filled 2021-02-14: qty 2

## 2021-02-14 MED ORDER — ACETAMINOPHEN 325 MG PO TABS
650.0000 mg | ORAL_TABLET | Freq: Four times a day (QID) | ORAL | Status: DC | PRN
  Administered 2021-02-15: 650 mg via ORAL
  Filled 2021-02-14: qty 2

## 2021-02-14 MED ORDER — ACETAMINOPHEN 650 MG RE SUPP: 650.0000 mg | Freq: Four times a day (QID) | RECTAL | Status: DC | PRN

## 2021-02-14 MED ORDER — ATORVASTATIN CALCIUM 20 MG PO TABS
80.0000 mg | ORAL_TABLET | Freq: Every day | ORAL | Status: DC
  Administered 2021-02-15: 80 mg via ORAL
  Filled 2021-02-14: qty 4

## 2021-02-14 MED ORDER — ONDANSETRON HCL 4 MG PO TABS: 4.0000 mg | ORAL_TABLET | Freq: Four times a day (QID) | ORAL | Status: DC | PRN

## 2021-02-14 MED ORDER — LIDOCAINE 5 % EX PTCH
1.0000 | MEDICATED_PATCH | CUTANEOUS | Status: DC
  Administered 2021-02-14 – 2021-02-15 (×3): 1 via TRANSDERMAL
  Filled 2021-02-14 (×3): qty 1

## 2021-02-14 MED ORDER — LISINOPRIL-HYDROCHLOROTHIAZIDE 20-12.5 MG PO TABS: 1.0000 | ORAL_TABLET | Freq: Every day | ORAL | Status: DC

## 2021-02-14 MED ORDER — FLUOXETINE HCL 10 MG PO CAPS
20.0000 mg | ORAL_CAPSULE | Freq: Every day | ORAL | Status: DC
  Administered 2021-02-15 – 2021-02-16 (×2): 20 mg via ORAL
  Filled 2021-02-14 (×2): qty 2

## 2021-02-14 MED ORDER — ONDANSETRON HCL 4 MG/2ML IJ SOLN: 4.0000 mg | Freq: Four times a day (QID) | INTRAMUSCULAR | Status: DC | PRN

## 2021-02-14 MED ORDER — MAGNESIUM HYDROXIDE 400 MG/5ML PO SUSP: 30.0000 mL | Freq: Every day | ORAL | Status: DC | PRN

## 2021-02-14 MED ORDER — METOPROLOL SUCCINATE ER 50 MG PO TB24
50.0000 mg | ORAL_TABLET | Freq: Every day | ORAL | Status: DC
  Administered 2021-02-15 – 2021-02-16 (×2): 50 mg via ORAL
  Filled 2021-02-14 (×2): qty 1

## 2021-02-14 MED ORDER — ISOSORBIDE MONONITRATE ER 60 MG PO TB24
60.0000 mg | ORAL_TABLET | Freq: Every day | ORAL | Status: DC
  Administered 2021-02-15 – 2021-02-16 (×2): 60 mg via ORAL
  Filled 2021-02-14 (×2): qty 1

## 2021-02-14 MED ORDER — FAMOTIDINE 20 MG PO TABS
20.0000 mg | ORAL_TABLET | Freq: Two times a day (BID) | ORAL | Status: DC | PRN
  Administered 2021-02-15: 20 mg via ORAL
  Filled 2021-02-14: qty 1

## 2021-02-14 MED ORDER — ADULT MULTIVITAMIN W/MINERALS CH
1.0000 | ORAL_TABLET | Freq: Every day | ORAL | Status: DC
  Administered 2021-02-15 – 2021-02-16 (×2): 1 via ORAL
  Filled 2021-02-14 (×2): qty 1

## 2021-02-14 MED ORDER — VITAMIN D 25 MCG (1000 UNIT) PO TABS
1000.0000 [IU] | ORAL_TABLET | Freq: Every day | ORAL | Status: DC
  Administered 2021-02-15 – 2021-02-16 (×2): 1000 [IU] via ORAL
  Filled 2021-02-14 (×2): qty 1

## 2021-02-14 MED ORDER — SODIUM CHLORIDE 0.9 % IV SOLN: INTRAVENOUS | Status: DC

## 2021-02-14 MED ORDER — TRAZODONE HCL 50 MG PO TABS
25.0000 mg | ORAL_TABLET | Freq: Every evening | ORAL | Status: DC | PRN
  Administered 2021-02-14: 25 mg via ORAL
  Filled 2021-02-14: qty 1

## 2021-02-14 NOTE — ED Notes (Signed)
Pt transported to CT ?

## 2021-02-14 NOTE — ED Provider Notes (Signed)
[facility] Provider Note    Event Date/Time   First MD Initiated Contact with Patient 02/14/21 2150     (approximate)   History   Weakness   HPI  Samantha [name] is a 88 y.o. female who presents to the ED for evaluation of Weakness   I review outpatient PCP visit from June 2022.  Evaluated for hypersomnia and SSRI was titrated up. She is anticoagulated on Eliquis  Patient presents to the ED via EMS from home for evaluation of acute on chronic weakness and a fall that occurred 4 days ago.  She reportedly has been weak for about 6 months, progressively worsening.  She lives at home by herself and has family checking in on her.  Patient reports mechanical fall 4 days ago where her right leg gave out on her, causing her to stumble to the ground and struck the right side of her chest.  She reports soreness and pain beneath her right breast.  Denies abdominal pain, syncope, emesis or significant trauma to the extremities.  Reports that she is sleeping all the time and feels weak overall.  Denies cough, fever or additional fall.  Denies any syncopal episodes.   Physical Exam   Triage Vital Signs: ED Triage Vitals  Enc Vitals Group     BP 02/14/21 2147 (!) 188/90     Pulse Rate 02/14/21 2147 73     Resp 02/14/21 2147 18     Temp 02/14/21 2147 98 F (36.7 C)     Temp Source 02/14/21 2147 Oral     SpO2 02/14/21 2147 98 %     Weight 02/14/21 2146 145 lb (65.8 kg)     Height 02/14/21 2146 5' (1.524 m)     Head Circumference --      Peak Flow --      Pain Score 02/14/21 2146 0     Pain Loc --      Pain Edu? --      Excl. in [place]? --     Most recent vital signs: Vitals:   02/14/21 2200 02/14/21 2230  BP: (!) 182/81 (!) 181/76  Pulse: 69 67  Resp: 17   Temp:    SpO2: 95% 97%    General: Awake, no distress.  Quite hard of hearing.  Pleasant and follows commands in all 4 extremities. CV:  Good peripheral perfusion.  Resp:  Normal effort.  Abd:  No  distention.  Suprapubic tenderness without peritoneal features.  Abdomen is otherwise benign. MSK:  Small area of bruising to her right shin with minimal tenderness.  No bony step-offs.  Full active and passive range of motion to bilateral lower extremities and upper extremities without significant deformity otherwise, beyond the bruising. Tenderness to palpation to right anterior and lateral rib cage around T5-T7. Neuro:  No focal deficits appreciated. Cranial nerves II through XII intact 5/5 strength and sensation in all 4 extremities Other:     ED Results / Procedures / Treatments   Labs (all labs ordered are listed, but only abnormal results are displayed) Labs Reviewed  COMPREHENSIVE METABOLIC PANEL - Abnormal; Notable for the following components:      Result Value   Glucose, Bld 119 (*)    Albumin 3.4 (*)    All other components within normal limits  RESP PANEL BY RT-PCR (FLU A&B, COVID) [id number]  CBC WITH DIFFERENTIAL/PLATELET  URINALYSIS, ROUTINE W REFLEX MICROSCOPIC  CK    EKG  Sinus rhythm, rate of  72 bpm.  Normal axis and intervals.  No evidence of acute ischemia.  RADIOLOGY CT head reviewed by me without evidence of acute intracranial pathology. CT chest reviewed by me with right sixth rib fracture  Official radiology report(s): CT HEAD WO CONTRAST (5MM)  Result Date: 02/14/2021 CLINICAL DATA:  [place] 4 days ago, right-sided bruising, hypertension EXAM: CT HEAD WITHOUT CONTRAST TECHNIQUE: Contiguous axial images were obtained from the base of the skull through the vertex without intravenous contrast. RADIATION DOSE REDUCTION: This exam was performed according to the departmental dose-optimization program which includes automated exposure control, adjustment of the mA and/or kV according to patient size and/or use of iterative reconstruction technique. COMPARISON:  07/29/2020 FINDINGS: Brain: Stable hypodensities within the bilateral periventricular white matter consistent  with chronic small vessel ischemic change. No evidence of acute infarct or hemorrhage. Lateral ventricles and remaining midline structures are unremarkable. No acute extra-axial fluid collections. No mass effect. Vascular: No hyperdense vessel or unexpected calcification. Skull: Normal. Negative for fracture or focal lesion. Sinuses/Orbits: No acute finding. Postsurgical changes from left mastoidectomy. Minimal mucosal thickening within the surgical defect. Other: None. IMPRESSION: 1. No acute intracranial process. 2. Stable chronic small-vessel ischemic changes within the white matter. 3. Stable postsurgical changes from prior left mastoidectomy. Electronically Signed   By: Randa Ngo M.D.   On: 02/14/2021 22:47   CT Chest Wo Contrast  Result Date: 02/14/2021 CLINICAL DATA:  Fall EXAM: CT CHEST WITHOUT CONTRAST TECHNIQUE: Multidetector CT imaging of the chest was performed following the standard protocol without IV contrast. RADIATION DOSE REDUCTION: This exam was performed according to the departmental dose-optimization program which includes automated exposure control, adjustment of the mA and/or kV according to patient size and/or use of iterative reconstruction technique. COMPARISON:  None. FINDINGS: Cardiovascular: Coronary artery calcification and aortic atherosclerosis. Heart size is normal. No pericardial effusion. Mediastinum/Nodes: No enlarged mediastinal or axillary lymph nodes. Thyroid gland, trachea, and esophagus demonstrate no significant findings. Lungs/Pleura: Mild multifocal atelectasis. No pleural effusion or pneumothorax Upper Abdomen: Negative Musculoskeletal: Minimally displaced right anterior 6th rib fracture. IMPRESSION: 1. Minimally displaced right anterior 6th rib fracture. Aortic Atherosclerosis (ICD10-I70.0). Electronically Signed   By: Ulyses Jarred M.D.   On: 02/14/2021 22:48   CT Cervical Spine Wo Contrast  Result Date: 02/14/2021 CLINICAL DATA:  Generalized weakness for 1  month, fell 4 days ago, right-sided bruising EXAM: CT CERVICAL SPINE WITHOUT CONTRAST TECHNIQUE: Multidetector CT imaging of the cervical spine was performed without intravenous contrast. Multiplanar CT image reconstructions were also generated. RADIATION DOSE REDUCTION: This exam was performed according to the departmental dose-optimization program which includes automated exposure control, adjustment of the mA and/or kV according to patient size and/or use of iterative reconstruction technique. COMPARISON:  11/18/2015 FINDINGS: Alignment: Alignment is anatomic. Skull base and vertebrae: No acute fracture. No primary bone lesion or focal pathologic process. Stable postsurgical changes from prior left mastoidectomy. Soft tissues and spinal canal: No prevertebral fluid or swelling. No visible canal hematoma. Disc levels: Mild C6-7 spondylosis. Remaining disc spaces are well preserved. Upper chest: Airway is patent.  Lung apices are clear. Other: Reconstructed images demonstrate no additional findings. IMPRESSION: 1. No acute cervical spine fracture. 2. Postsurgical changes from prior left mastoidectomy. 3. C6-7 spondylosis. Electronically Signed   By: Randa Ngo M.D.   On: 02/14/2021 22:50    PROCEDURES and INTERVENTIONS:  .1-3 Lead EKG Interpretation Performed by: Vladimir Crofts, MD Authorized by: Vladimir Crofts, MD     Interpretation: normal  ECG rate:  70   ECG rate assessment: normal     Rhythm: sinus rhythm     Ectopy: none     Conduction: normal    Medications  acetaminophen (TYLENOL) tablet 1,000 mg (has no administration in time range)  lidocaine (LIDODERM) 5 % 1 patch (has no administration in time range)     IMPRESSION / MDM / ASSESSMENT AND PLAN / ED COURSE  I reviewed the triage vital signs and the nursing notes.  88 year old female who lives at home by herself presents to the ED 4 days after a fall with increasing chronic weakness, found to have a right rib fracture and  requiring medical observation admission to likely facilitate placement in rehab.  She has a small bruise to her right shin, but no bony step-offs or significant tenderness, no impaired range of motion to any joints of the extremities.  CT head obtained due to her history of anticoagulation, no ICH and C-spine without evidence of fracture.  CT of her chest obtained due to high suspicion for rib fracture and this does show a right sixth rib fracture.  Her blood work is benign without abnormalities to her metabolic panel or CBC.  CK is normal and urine without infectious features.  I am unable to reach the sister and I am concerned about an unsafe discharge plan for the patient considering her living at home by herself.  I consulted with medicine who agrees to admit for observation Clinical Course as of 02/14/21 2304  Sun Feb 14, 2021  2242 Called sister, Julio Alm, both home and mobile numbers, no response and no VM availability [DS]    Clinical Course User Index [DS] Vladimir Crofts, MD     FINAL CLINICAL IMPRESSION(S) / ED DIAGNOSES   Final diagnoses:  Closed fracture of one rib of right side, initial encounter  Generalized weakness     Rx / DC Orders   ED Discharge Orders     None        Note:  This document was prepared using Dragon voice recognition software and may include unintentional dictation errors.   Vladimir Crofts, MD 02/14/21 [phone]

## 2021-02-14 NOTE — H&P (Signed)
[place]   PATIENT NAME: Samantha [name]    MR#:  [id number]  DATE OF BIRTH:  10-22-1928  DATE OF ADMISSION:  02/14/2021  PRIMARY CARE PHYSICIAN: Delsa Grana, PA-C   Patient is coming from: Home  REQUESTING/REFERRING PHYSICIAN: Vladimir Crofts, MD  CHIEF COMPLAINT:   Chief Complaint  Patient presents with   Weakness    HISTORY OF PRESENT ILLNESS:  Samantha [name] is a 88 y.o. Caucasian female with medical history significant for coronary artery disease, atrial fibrillation, depression, dyslipidemia, hypertension and CVA, who presented to the emergency room with acute onset of generalized weakness and failure to thrive.  She had a fall about 4 days ago and has been getting weaker over the last 6 months.  She lives at home by herself and her family checks on her.  She denies any presyncope or syncope with her fall.  She felt her right leg giving out.  She then stumbled to the ground and hit the right side of her chest.  Since then she has been having pain underneath her right breast.  No paresthesias or focal muscle weakness.  She denies any head injury.  She had right leg contusion as well as right knee and contusion.  No nausea or vomiting or abdominal pain.  No dysuria, oliguria or hematuria or flank pain.  She has been having chronic left ear drainage with hearing loss.  She also complains of recent excessive sleepiness.  ED Course: Upon presentation to the emergency room blood pressure was 188/90 with otherwise normal vital signs.  Labs revealed albumin of 3.4 with otherwise unremarkable CMP.  CBC was within normal.  UA was negative.  Imaging: Noncontrasted head CT scan showed no acute intracranial process and chronic small vessel ischemic changes within the white matter and stable postsurgical changes from prior left mastoidectomy.  Chest CT without contrast showed minimally displaced right anterior sixth rib fracture and aortic atherosclerosis.  C-spine CT showed no acute  cervical spine fracture, C6-7 spondylosis and postsurgical changes from prior left mastoidectomy.  The patient was given 1 g of p.o. Tylenol and 1 inch of Lidoderm 5%.  She will be admitted to a medical bed for further evaluation and management.  PAST MEDICAL HISTORY:   Past Medical History:  Diagnosis Date   A-fib ([place]) 06/07/2014   Arm fracture 03/10/2017   right   CAD (coronary artery disease) 06/07/2014   Closed right radial fracture 03/21/2017   Depression 09/24/2015   Dyslipidemia 08/16/2016   Dysphonia 08/16/2016   Hypertension    Mild aortic stenosis 08/22/2016   Echo July 2018   Mitral regurgitation 08/22/2016   Echo July 2018   Stroke [facility])     PAST SURGICAL HISTORY:   Past Surgical History:  Procedure Laterality Date   ABDOMINAL HYSTERECTOMY     APPENDECTOMY     CHOLECYSTECTOMY     FRACTURE SURGERY      SOCIAL HISTORY:   Social History   Tobacco Use   Smoking status: Never   Smokeless tobacco: Never  Substance Use Topics   Alcohol use: No    FAMILY HISTORY:   Family History  Problem Relation Age of Onset   Stroke Mother    Heart attack Father    Heart disease Sister    Diabetes Sister    Heart disease Brother    Heart attack Brother    Diabetes Maternal Grandfather    Ovarian cancer Paternal Grandmother    Cancer Brother  bladder cancer   Diabetes Brother    Heart disease Brother    Hypertension Brother    Heart attack Daughter     DRUG ALLERGIES:   Allergies  Allergen Reactions   Iodine Nausea And Vomiting   Levaquin [Levofloxacin] Other (See Comments)    Pt states she have nose bleeds and pain in arms and shoulders.    REVIEW OF SYSTEMS:   ROS As per history of present illness. All pertinent systems were reviewed above. Constitutional, HEENT, cardiovascular, respiratory, GI, GU, musculoskeletal, neuro, psychiatric, endocrine, integumentary and hematologic systems were reviewed and are otherwise negative/unremarkable except for  positive findings mentioned above in the HPI.   MEDICATIONS AT HOME:   Prior to Admission medications   Medication Sig Start Date End Date Taking? Authorizing Provider  apixaban (ELIQUIS) 5 MG TABS tablet Take 1 tablet (5 mg total) by mouth 2 (two) times daily. 06/08/14   Bettey Costa, MD  atorvastatin (LIPITOR) 80 MG tablet Take 1 tablet (80 mg total) by mouth at bedtime. 10/12/20   Kathrine Haddock, NP  Cholecalciferol 25 MCG (1000 UT) tablet Take 1,000 Units by mouth daily.    [provider]  famotidine (PEPCID) 20 MG tablet Take 1 tablet (20 mg total) by mouth 2 (two) times daily as needed for heartburn or indigestion. 05/08/19   Delsa Grana, PA-C  FLUoxetine (PROZAC) 20 MG capsule Take 1 capsule (20 mg total) by mouth daily. 06/23/20   Kathrine Haddock, NP  isosorbide mononitrate (IMDUR) 60 MG 24 hr tablet Take 1 tablet (60 mg total) by mouth daily. 12/21/20   Kathrine Haddock, NP  lisinopril-hydrochlorothiazide (ZESTORETIC) 20-12.5 MG tablet Take 1 tablet by mouth daily. 01/22/21   Delsa Grana, PA-C  metoprolol succinate (TOPROL-XL) 50 MG 24 hr tablet Take 1 tablet (50 mg total) by mouth daily. Take with or immediately following a meal. 01/07/21   Kathrine Haddock, NP  Multiple Vitamin (MULTIVITAMIN) tablet Take 1 tablet by mouth daily.    [provider]      VITAL SIGNS:  Blood pressure (!) 175/71, pulse 65, temperature 98 F (36.7 C), temperature source Oral, resp. rate 20, height 5' (1.524 m), weight 65.8 kg, SpO2 97 %.  PHYSICAL EXAMINATION:  Physical Exam  GENERAL:  88 y.o.-year-old Caucasian female patient lying in the bed with no acute distress.  EYES: Pupils equal, round, reactive to light and accommodation. No scleral icterus. Extraocular muscles intact.  HEENT: Head atraumatic, normocephalic. Oropharynx and nasopharynx clear.  Left ear with ceruminous AC. NECK:  Supple, no jugular venous distention. No thyroid enlargement, no tenderness.  LUNGS: Normal breath  sounds bilaterally, no wheezing, rales,rhonchi or crepitation. No use of accessory muscles of respiration.  CARDIOVASCULAR: Regular rate and rhythm, S1, S2 normal. No murmurs, rubs, or gallops.  ABDOMEN: Soft, nondistended, nontender. Bowel sounds present. No organomegaly or mass.    Musculoskeletal: Right chest wall tenderness at the sixth rib. Right knee swelling and contusion.  Right leg contusion. EXTREMITIES: No pedal edema, cyanosis, or clubbing.  NEUROLOGIC: Cranial nerves II through XII are intact. Muscle strength 5/5 in all extremities. Sensation intact. Gait not checked.  PSYCHIATRIC: The patient is alert and oriented x 3.  Normal affect and good eye contact. SKIN: No obvious rash, lesion, or ulcer.   LABORATORY PANEL:   CBC Recent Labs  Lab 02/14/21 2152  WBC 6.5  HGB 14.4  HCT 44.0  PLT 264   ------------------------------------------------------------------------------------------------------------------  Chemistries  Recent Labs  Lab 02/14/21 2152  NA 141  K 4.3  CL 102  CO2 28  GLUCOSE 119*  BUN 15  CREATININE 0.87  CALCIUM 9.4  AST 26  ALT 20  ALKPHOS 104  BILITOT 1.0   ------------------------------------------------------------------------------------------------------------------  Cardiac Enzymes No results for input(s): TROPONINI in the last 168 hours. ------------------------------------------------------------------------------------------------------------------  RADIOLOGY:  CT HEAD WO CONTRAST (5MM)  Result Date: 02/14/2021 CLINICAL DATA:  [place] 4 days ago, right-sided bruising, hypertension EXAM: CT HEAD WITHOUT CONTRAST TECHNIQUE: Contiguous axial images were obtained from the base of the skull through the vertex without intravenous contrast. RADIATION DOSE REDUCTION: This exam was performed according to the departmental dose-optimization program which includes automated exposure control, adjustment of the mA and/or kV according to patient size  and/or use of iterative reconstruction technique. COMPARISON:  07/29/2020 FINDINGS: Brain: Stable hypodensities within the bilateral periventricular white matter consistent with chronic small vessel ischemic change. No evidence of acute infarct or hemorrhage. Lateral ventricles and remaining midline structures are unremarkable. No acute extra-axial fluid collections. No mass effect. Vascular: No hyperdense vessel or unexpected calcification. Skull: Normal. Negative for fracture or focal lesion. Sinuses/Orbits: No acute finding. Postsurgical changes from left mastoidectomy. Minimal mucosal thickening within the surgical defect. Other: None. IMPRESSION: 1. No acute intracranial process. 2. Stable chronic small-vessel ischemic changes within the white matter. 3. Stable postsurgical changes from prior left mastoidectomy. Electronically Signed   By: Randa Ngo M.D.   On: 02/14/2021 22:47   CT Chest Wo Contrast  Result Date: 02/14/2021 CLINICAL DATA:  Fall EXAM: CT CHEST WITHOUT CONTRAST TECHNIQUE: Multidetector CT imaging of the chest was performed following the standard protocol without IV contrast. RADIATION DOSE REDUCTION: This exam was performed according to the departmental dose-optimization program which includes automated exposure control, adjustment of the mA and/or kV according to patient size and/or use of iterative reconstruction technique. COMPARISON:  None. FINDINGS: Cardiovascular: Coronary artery calcification and aortic atherosclerosis. Heart size is normal. No pericardial effusion. Mediastinum/Nodes: No enlarged mediastinal or axillary lymph nodes. Thyroid gland, trachea, and esophagus demonstrate no significant findings. Lungs/Pleura: Mild multifocal atelectasis. No pleural effusion or pneumothorax Upper Abdomen: Negative Musculoskeletal: Minimally displaced right anterior 6th rib fracture. IMPRESSION: 1. Minimally displaced right anterior 6th rib fracture. Aortic Atherosclerosis (ICD10-I70.0).  Electronically Signed   By: Ulyses Jarred M.D.   On: 02/14/2021 22:48   CT Cervical Spine Wo Contrast  Result Date: 02/14/2021 CLINICAL DATA:  Generalized weakness for 1 month, fell 4 days ago, right-sided bruising EXAM: CT CERVICAL SPINE WITHOUT CONTRAST TECHNIQUE: Multidetector CT imaging of the cervical spine was performed without intravenous contrast. Multiplanar CT image reconstructions were also generated. RADIATION DOSE REDUCTION: This exam was performed according to the departmental dose-optimization program which includes automated exposure control, adjustment of the mA and/or kV according to patient size and/or use of iterative reconstruction technique. COMPARISON:  11/18/2015 FINDINGS: Alignment: Alignment is anatomic. Skull base and vertebrae: No acute fracture. No primary bone lesion or focal pathologic process. Stable postsurgical changes from prior left mastoidectomy. Soft tissues and spinal canal: No prevertebral fluid or swelling. No visible canal hematoma. Disc levels: Mild C6-7 spondylosis. Remaining disc spaces are well preserved. Upper chest: Airway is patent.  Lung apices are clear. Other: Reconstructed images demonstrate no additional findings. IMPRESSION: 1. No acute cervical spine fracture. 2. Postsurgical changes from prior left mastoidectomy. 3. C6-7 spondylosis. Electronically Signed   By: Randa Ngo M.D.   On: 02/14/2021 22:50      IMPRESSION AND PLAN:  Principal Problem:   FTT (failure to thrive) in  adult  1.  Mechanical fall with subsequent right side 6 rib fracture with displacement. - The patient will be admitted to a medical bed. - Pain management to be provided. - Incentive spirometry will be utilized.  2.  Generalized weakness and failure to thrive. - We will obtain a PT consultation. - Case management consult to be obtained for arrangement of outpatient PT/OT. - It seems like the patient does not like to go to skilled nursing facility.  3.   Dyslipidemia. - We will continue statin therapy.  4.  Essential hypertension. - We will continue Zestoretic and Toprol-XL.  5.  Depression. - We will continue Prozac.  6.  Paroxysmal atrial fibrillation. - We will continue Toprol-XL and Eliquis.  7.  GERD. - We will continue H2 blocker therapy.  8.  Narcolepsy. - We will start her on Provigil  DVT prophylaxis: Lovenox. Code Status: full code. Family Communication:  The plan of care was discussed in details with the patient (and family). I answered all questions. The patient agreed to proceed with the above mentioned plan. Further management will depend upon hospital course. Disposition Plan: Back to previous home environment Consults called: none. All the records are reviewed and case discussed with ED provider.  Status is: Inpatient   At the time of the admission, it appears that the appropriate admission status for this patient is inpatient.  This is judged to be reasonable and necessary in order to provide the required intensity of service to ensure the patient's safety given the presenting symptoms, physical exam findings and initial radiographic and laboratory data in the context of comorbid conditions.  The patient requires inpatient status due to high intensity of service, high risk of further deterioration and high frequency of surveillance required.  I certify that at the time of admission, it is my clinical judgment that the patient will require inpatient hospital care extending more than 2 midnights.                            Dispo: The patient is from: Home              Anticipated d/c is to: Home              Patient currently is not medically stable to d/c.              Difficult to place patient: No  Christel Mormon M.D on 02/14/2021 at 11:20 PM  [facility]   From 7 PM-7 AM, contact night-coverage [email or web address]  CC: Primary care physician; Delsa Grana, PA-C

## 2021-02-14 NOTE — ED Triage Notes (Signed)
Pt brought in by [facility] from home. Pt c/o generalized weakness for the past month. Pt had mechanical fall about 4 days ago and has some bruising to the right side. Pt noted to be hypertensive for EMS, pt states she didn't take her home meds this morning.

## 2021-02-15 DIAGNOSIS — R627 Adult failure to thrive: Principal | ICD-10-CM

## 2021-02-15 LAB — BASIC METABOLIC PANEL
Anion gap: 8 (ref 5–15)
BUN: 15 mg/dL (ref 8–23)
CO2: 27 mmol/L (ref 22–32)
Calcium: 8.9 mg/dL (ref 8.9–10.3)
Chloride: 106 mmol/L (ref 98–111)
Creatinine, Ser: 0.7 mg/dL (ref 0.44–1.00)
GFR, Estimated: >60 mL/min (ref >60)
Glucose, Bld: 88 mg/dL (ref 70–99)
Potassium: 3.7 mmol/L (ref 3.5–5.1)
Sodium: 141 mmol/L (ref 135–145)

## 2021-02-15 MED ORDER — HYDROCHLOROTHIAZIDE 12.5 MG PO TABS
12.5000 mg | ORAL_TABLET | Freq: Every day | ORAL | Status: DC
  Administered 2021-02-15 – 2021-02-16 (×2): 12.5 mg via ORAL
  Filled 2021-02-15 (×2): qty 1

## 2021-02-15 MED ORDER — MODAFINIL 100 MG PO TABS
100.0000 mg | ORAL_TABLET | Freq: Every day | ORAL | Status: DC
  Filled 2021-02-15: qty 1

## 2021-02-15 MED ORDER — LISINOPRIL 20 MG PO TABS
20.0000 mg | ORAL_TABLET | Freq: Every day | ORAL | Status: DC
  Administered 2021-02-15 – 2021-02-16 (×2): 20 mg via ORAL
  Filled 2021-02-15 (×2): qty 1

## 2021-02-15 NOTE — Evaluation (Signed)
Physical Therapy Evaluation Patient Details Name: ERICIA [name] MRN: [id number] DOB: 19-Aug-1928 Today's Date: 02/15/2021  History of Present Illness  Samantha [name] is a 88 y.o. female with medical history significant for coronary artery disease, atrial fibrillation, depression, dyslipidemia, hypertension and CVA, who presented to the emergency room with acute onset of generalized weakness and failure to thrive.  She had a fall about 4 days ago and has been getting weaker over the last 6 months. Pt reports fall occured d/t her stepping down onto her outside step and her RLE "just gave out". She reports prior independence with all household and community ADLs though she does have to have someone drive her, and has family/neighbors check on her frequently  Clinical Impression  Pt is a 88y/o female, previously ind in all household and community ADLs without AD. Recent fall stepping down her outside steps reports her RLE "gave way" ; no other falls to report in past 6 months. Pt standing from chair with alarm going off as PT entered, patient reporting she was getting up to use the restroom. PT assisted patient with line and lead management without AD, cuing to "slow down" for line management to restroom. Patient able to complete toileting ind. Patent is able to ambulate with supervision with cuing for increased foot clearance around nurses station and back to room without AD. Patient is appropriate for home d/c with HHPT to evaluate home environment for fall risk concerns to prevent subsequent falls. Would benefit from skilled PT to address above deficits and promote optimal return to PLOF; including stair negotiation.      Recommendations for follow up therapy are one component of a multi-disciplinary discharge planning process, led by the attending physician.  Recommendations may be updated based on patient status, additional functional criteria and insurance authorization.  Follow Up Recommendations  Home health PT    Assistance Recommended at Discharge PRN  Patient can return home with the following       Equipment Recommendations  none  Recommendations for Other Services    none   Functional Status Assessment Patient has had a recent decline in their functional status and demonstrates the ability to make significant improvements in function in a reasonable and predictable amount of time.     Precautions / Restrictions Restrictions Weight Bearing Restrictions: No      Mobility  Bed Mobility               General bed mobility comments: not assessed, patient up in chair at enter and exit    Transfers Overall transfer level: Needs assistance   Transfers: Sit to/from Stand Sit to Stand: Independent           General transfer comment: Pt standing from chair with alarm going off on PT arrival    Ambulation/Gait Ambulation/Gait assistance: Supervision Gait Distance (Feet): 200 Feet Assistive device: Rolling walker (2 wheels) Gait Pattern/deviations: Decreased stride length Gait velocity: decreased- appropriate for household ambulator Gait velocity interpretation: <1.8 ft/sec, indicate of risk for recurrent falls   General Gait Details: min cuing for safety awareness with line/lead management  Stairs            Wheelchair Mobility    Modified Rankin (Stroke Patients Only)       Balance Overall balance assessment: Needs assistance Sitting-balance support: No upper extremity supported Sitting balance-Leahy Scale: Normal     Standing balance support: No upper extremity supported Standing balance-Leahy Scale: Normal   Single Leg Stance - Right Leg:  3 Single Leg Stance - Left Leg: 4 Tandem Stance - Right Leg: 0 Tandem Stance - Left Leg: 0 Rhomberg - Eyes Opened: 30 Rhomberg - Eyes Closed: 30 (ankle strategy)                 Pertinent Vitals/Pain Pain Assessment: 0-10 Pain Score: 6  Pain Location: R ribs with deep inhale or  cough Pain Descriptors / Indicators: Aching Pain Intervention(s): Limited activity within patient's tolerance;Monitored during session    Home Living Family/patient expects to be discharged to:: Private residence   Available Help at Discharge: Friend(s);Available PRN/intermittently;Neighbor Type of Home: Apartment Home Access: Stairs to enter Entrance Stairs-Rails: Psychiatric nurse of Steps: 5 rails on both [place] cannot reach both at same time   Home Layout: One level Home Equipment: Cane - single point;Grab bars - toilet;Grab bars - tub/shower;Shower seat;Hand held shower head      Prior Function Prior Level of Function : Independent/Modified Independent             Mobility Comments: reports ambulation and all household mobility without AD; does use shopping carts in stores to "feel safer" ADLs Comments: completes all household and community ADLs modI though she does have neighbors check on her often, and neighbors and friends drive her places     Hand Dominance   Dominant Hand: Right    Extremity/Trunk Assessment   Upper Extremity Assessment Upper Extremity Assessment: Overall WFL for tasks assessed    Lower Extremity Assessment Lower Extremity Assessment: Overall WFL for tasks assessed (5/5 gross MMTs BLE)    Cervical / Trunk Assessment Cervical / Trunk Assessment: Kyphotic  Communication   Communication: HOH (hears better in R ear)  Cognition Arousal/Alertness: Awake/alert Behavior During Therapy: WFL for tasks assessed/performed Overall Cognitive Status: Within Functional Limits for tasks assessed                                 General Comments: Ox 4        General Comments      Exercises     Assessment/Plan    PT Assessment Patient needs continued PT services  PT Problem List Decreased safety awareness;Decreased balance       PT Treatment Interventions      PT Goals (Current goals can be found in the Care  Plan section)  Acute Rehab PT Goals Patient Stated Goal: to go home PT Goal Formulation: With patient Time For Goal Achievement: 03/01/21 Potential to Achieve Goals: Good    Frequency Min 2X/week     Co-evaluation               AM-PAC PT "6 Clicks" Mobility  Outcome Measure Help needed turning from your back to your side while in a flat bed without using bedrails?: A Little Help needed moving from lying on your back to sitting on the side of a flat bed without using bedrails?: A Little Help needed moving to and from a bed to a chair (including a wheelchair)?: A Little Help needed standing up from a chair using your arms (e.g., wheelchair or bedside chair)?: None Help needed to walk in hospital room?: A Little Help needed climbing 3-5 steps with a railing? : A Little 6 Click Score: 19    End of Session Equipment Utilized During Treatment: Gait belt Activity Tolerance: Patient tolerated treatment well Patient left: in chair;with nursing/sitter in room;with call bell/phone within reach (nursing to help  patient back to bed after changing sheets per pt request)   PT Visit Diagnosis: History of falling (Z91.81)    Time: 2162-4469 PT Time Calculation (min) (ACUTE ONLY): 16 min   Charges:   PT Evaluation $PT Eval Low Complexity: 1 Low PT Treatments $Therapeutic Activity: 8-22 mins        Durwin Reges DPT  Durwin Reges 02/15/2021, 3:46 PM

## 2021-02-15 NOTE — Progress Notes (Signed)
[place] [facility] [place] NAME: Samantha [name]    MR#:  [id number]  DATE OF BIRTH:  Apr 13, 1928  SUBJECTIVE:   patient was Samantha [name] she was fast asleep and according to her she did not hear her sister call her. Sister asked the police to check on her who got to her house and brought her here for further evaluation. Patient had a fall four days ago and was complaining of right rib cage pain. She is sitting in the chair. Tolerating PO intake. Appears to be at baseline doing very well. She reports feeling tired after doing some chores at home and takes in between naps. Denies any other complaints. REVIEW OF SYSTEMS:   Review of Systems  Constitutional:  Negative for chills, fever and weight loss.  HENT:  Negative for ear discharge, ear pain and nosebleeds.   Eyes:  Negative for blurred vision, pain and discharge.  Respiratory:  Negative for sputum production, shortness of breath, wheezing and stridor.   Cardiovascular:  Negative for chest pain, palpitations, orthopnea and PND.  Gastrointestinal:  Negative for abdominal pain, diarrhea, nausea and vomiting.  Genitourinary:  Negative for frequency and urgency.  Musculoskeletal:  Negative for back pain and joint pain.  Neurological:  Positive for weakness. Negative for sensory change, speech change and focal weakness.  Psychiatric/Behavioral:  Negative for depression and hallucinations. The patient is not nervous/anxious.   Tolerating Diet:yes Tolerating PT: HH  DRUG ALLERGIES:   Allergies  Allergen Reactions   Iodine Nausea And Vomiting   Levaquin [Levofloxacin] Other (See Comments)    Pt states she have nose bleeds and pain in arms and shoulders.    VITALS:  Blood pressure (!) 178/75, pulse 76, temperature 99.1 F (37.3 C), resp. rate 17, height 5' (1.524 m), weight 65.8 kg, SpO2 96 %.  PHYSICAL EXAMINATION:   Physical Exam  GENERAL:  88 y.o.-year-old patient lying in the bed with no  acute distress.  HEENT: Head atraumatic, normocephalic. Oropharynx and nasopharynx clear.  LUNGS: Normal breath sounds bilaterally, no wheezing, rales, rhonchi.  CARDIOVASCULAR: S1, S2 normal. No murmurs, rubs, or gallops.  ABDOMEN: Soft, nontender, nondistended. Bowel sounds present.  EXTREMITIES: No  edema b/l.   Right tibial shin bruise NEUROLOGIC: nonfocal PSYCHIATRIC:  patient is alert and awake SKIN: No obvious rash, lesion, or ulcer.   LABORATORY PANEL:  CBC Recent Labs  Lab 02/14/21 2152  WBC 6.5  HGB 14.4  HCT 44.0  PLT 264    Chemistries  Recent Labs  Lab 02/14/21 2152 02/15/21 0536  NA 141 141  K 4.3 3.7  CL 102 106  CO2 28 27  GLUCOSE 119* 88  BUN 15 15  CREATININE 0.87 0.70  CALCIUM 9.4 8.9  AST 26  --   ALT 20  --   ALKPHOS 104  --   BILITOT 1.0  --    Cardiac Enzymes No results for input(s): TROPONINI in the last 168 hours. RADIOLOGY:  CT HEAD WO CONTRAST (5MM)  Result Date: 02/14/2021 CLINICAL DATA:  [place] 4 days ago, right-sided bruising, hypertension EXAM: CT HEAD WITHOUT CONTRAST TECHNIQUE: Contiguous axial images were obtained from the base of the skull through the vertex without intravenous contrast. RADIATION DOSE REDUCTION: This exam was performed according to the departmental dose-optimization program which includes automated exposure control, adjustment of the mA and/or kV according to patient size and/or use of iterative reconstruction technique. COMPARISON:  07/29/2020 FINDINGS: Brain: Stable hypodensities within the bilateral periventricular  white matter consistent with chronic small vessel ischemic change. No evidence of acute infarct or hemorrhage. Lateral ventricles and remaining midline structures are unremarkable. No acute extra-axial fluid collections. No mass effect. Vascular: No hyperdense vessel or unexpected calcification. Skull: Normal. Negative for fracture or focal lesion. Sinuses/Orbits: No acute finding. Postsurgical changes from  left mastoidectomy. Minimal mucosal thickening within the surgical defect. Other: None. IMPRESSION: 1. No acute intracranial process. 2. Stable chronic small-vessel ischemic changes within the white matter. 3. Stable postsurgical changes from prior left mastoidectomy. Electronically Signed   By: Randa Ngo M.D.   On: 02/14/2021 22:47   CT Chest Wo Contrast  Result Date: 02/14/2021 CLINICAL DATA:  Fall EXAM: CT CHEST WITHOUT CONTRAST TECHNIQUE: Multidetector CT imaging of the chest was performed following the standard protocol without IV contrast. RADIATION DOSE REDUCTION: This exam was performed according to the departmental dose-optimization program which includes automated exposure control, adjustment of the mA and/or kV according to patient size and/or use of iterative reconstruction technique. COMPARISON:  None. FINDINGS: Cardiovascular: Coronary artery calcification and aortic atherosclerosis. Heart size is normal. No pericardial effusion. Mediastinum/Nodes: No enlarged mediastinal or axillary lymph nodes. Thyroid gland, trachea, and esophagus demonstrate no significant findings. Lungs/Pleura: Mild multifocal atelectasis. No pleural effusion or pneumothorax Upper Abdomen: Negative Musculoskeletal: Minimally displaced right anterior 6th rib fracture. IMPRESSION: 1. Minimally displaced right anterior 6th rib fracture. Aortic Atherosclerosis (ICD10-I70.0). Electronically Signed   By: Ulyses Jarred M.D.   On: 02/14/2021 22:48   CT Cervical Spine Wo Contrast  Result Date: 02/14/2021 CLINICAL DATA:  Generalized weakness for 1 month, fell 4 days ago, right-sided bruising EXAM: CT CERVICAL SPINE WITHOUT CONTRAST TECHNIQUE: Multidetector CT imaging of the cervical spine was performed without intravenous contrast. Multiplanar CT image reconstructions were also generated. RADIATION DOSE REDUCTION: This exam was performed according to the departmental dose-optimization program which includes automated exposure  control, adjustment of the mA and/or kV according to patient size and/or use of iterative reconstruction technique. COMPARISON:  11/18/2015 FINDINGS: Alignment: Alignment is anatomic. Skull base and vertebrae: No acute fracture. No primary bone lesion or focal pathologic process. Stable postsurgical changes from prior left mastoidectomy. Soft tissues and spinal canal: No prevertebral fluid or swelling. No visible canal hematoma. Disc levels: Mild C6-7 spondylosis. Remaining disc spaces are well preserved. Upper chest: Airway is patent.  Lung apices are clear. Other: Reconstructed images demonstrate no additional findings. IMPRESSION: 1. No acute cervical spine fracture. 2. Postsurgical changes from prior left mastoidectomy. 3. C6-7 spondylosis. Electronically Signed   By: Randa Ngo M.D.   On: 02/14/2021 22:50   ASSESSMENT AND PLAN:   ETHER GOEBEL is a 88 y.o. Caucasian female with medical history significant for coronary artery disease, atrial fibrillation, depression, dyslipidemia, hypertension and CVA, who presented to the emergency room with acute onset of generalized weakness . Patient had sustained a fall working on the steps, her legs ER in the last step. She felt. Did not hit her head. Complains of chest wall pain.  Noncontrasted head CT scan showed no acute intracranial process and chronic small vessel ischemic changes within the white matter and stable postsurgical changes from prior left mastoidectomy.   Chest CT without contrast showed minimally displaced right anterior sixth rib fracture and aortic atherosclerosis.   C-spine CT showed no acute cervical spine fracture, C6-7 spondylosis and postsurgical changes from prior left mastoidectomy   Mechanical fall with subsequent right side 6 rib fracture with displacement. - Pain management to be provided. - Incentive  spirometry     Generalized weakness -- PT to see patient. She is otherwise quite functional. Denies any frequent falls.  Had accident fall four days ago. - TOC for discharge planning   Dyslipidemia. -- statin therapy.  Essential hypertension. --continue Zestoretic and Toprol-XL.   Depression. -  continue Prozac.    Paroxysmal atrial fibrillation. - We will continue Toprol-XL and Eliquis.  GERD. - on H2 blocker therapy.   Procedures: Family communication : unable to reach sister or leave voicemail on the phone numbers provided Consults : CODE STATUS: full DVT Prophylaxis : eliquis Level of care: Med-Surg Status is: Inpatient  Remains inpatient appropriate because: generalized weakness fall pending PT anticipate discharge to home with home health tomorrow if continues to show improvement. Patient is in agreement with plan.        TOTAL TIME TAKING CARE OF THIS PATIENT: 25 minutes.  >50% time spent on counselling and coordination of care  Note: This dictation was prepared with Dragon dictation along with smaller phrase technology. Any transcriptional errors that result from this process are unintentional.  Fritzi Mandes M.D    [facility]   CC: Primary care physician; Delsa Grana, PA-C Patient ID: Lance Bosch, female   DOB: May 17, 1928, 88 y.o.   MRN: [id number]

## 2021-02-15 NOTE — Progress Notes (Signed)
Met with the patient to discuss DC plan and needs, She lives at home alone, Her sister provides transportation, She does not need DME at home, she is agreeable to [facility] services, I reached out to Kuna at [facility], I am awaiting a response for acceptance,

## 2021-02-15 NOTE — TOC Progression Note (Signed)
Transition of Care [facility]) - Progression Note    Patient Details  Name: Samantha [name] MRN: [id number] Date of Birth: 1928-06-30  Transition of Care [facility]) CM/SW [place], RN Phone Number: 02/15/2021, 9:36 AM  Clinical Narrative:   She lives at home by herself and her family checks on her.  She will need to be seen and evaluated by PT and get recommendations TOC to follow and assist with DC planning         Expected Discharge Plan and Services                                                 Social Determinants of Health (SDOH) Interventions    Readmission Risk Interventions No flowsheet data found.

## 2021-02-15 NOTE — ED Notes (Signed)
Pts sister updated on plan of care over the phone. States she will come in the morning to check on pt.

## 2021-02-15 NOTE — Plan of Care (Signed)

## 2021-02-15 NOTE — Plan of Care (Signed)
  Problem: Health Behavior/Discharge Planning: Goal: Ability to manage health-related needs will improve Outcome: Progressing   

## 2021-02-16 NOTE — TOC Progression Note (Signed)
Transition of Care [facility]) - Progression Note    Patient Details  Name: Samantha [name] MRN: [id number] Date of Birth: 06/27/1928  Transition of Care [facility]) CM/SW [place], RN Phone Number: 02/16/2021, 10:16 AM  Clinical Narrative:    The patient is set up with [facility] services to go home today, Her sister provides transportation, I attempted to call all numbers listed for her sister and neither phone number works,   I called a friend of the patients and she provided a new number for the sister [phone], I called and spoke to the Sister and she will pick up the patient at the [place] entrance at [address], I notified the nurse   Expected Discharge Plan: [place] Barriers to Discharge: Continued Medical Work up  Expected Discharge Plan and Services Expected Discharge Plan: [place]   Discharge Planning Services: CM Consult   Living arrangements for the past 2 months: Single Family Home Expected Discharge Date: 02/16/21               DME Arranged: N/A         HH Arranged: PT, OT HH Agency: [place] Date [place]: 02/15/21 Time [place]: 65 Representative spoke with at [place]: [place] ([place]) Interventions    Readmission Risk Interventions No flowsheet data found.

## 2021-02-16 NOTE — Discharge Summary (Signed)
[facility] [place] NAME: Samantha [name]    MR#:  [id number]  DATE OF BIRTH:  09/12/1928  DATE OF ADMISSION:  02/14/2021 ADMITTING PHYSICIAN: Christel Mormon, MD  DATE OF DISCHARGE: 02/16/2021  PRIMARY CARE PHYSICIAN: Delsa Grana, PA-C    ADMISSION DIAGNOSIS:  FTT (failure to thrive) in adult [R62.7] Generalized weakness [R53.1] Closed fracture of one rib of right side, initial encounter [S22.31XA]  DISCHARGE DIAGNOSIS:  mechanical fall with right sixth rib fracture (closed)  SECONDARY DIAGNOSIS:   Past Medical History:  Diagnosis Date   A-fib ([place]) 06/07/2014   Arm fracture 03/10/2017   right   CAD (coronary artery disease) 06/07/2014   Closed right radial fracture 03/21/2017   Depression 09/24/2015   Dyslipidemia 08/16/2016   Dysphonia 08/16/2016   Hypertension    Mild aortic stenosis 08/22/2016   Echo July 2018   Mitral regurgitation 08/22/2016   Echo July 2018   Stroke [facility])     HOSPITAL COURSE:   Samantha [name] is a 88 y.o. Caucasian female with medical history significant for coronary artery disease, atrial fibrillation, depression, dyslipidemia, hypertension and CVA, who presented to the emergency room with acute onset of generalized weakness . Patient had sustained a fall working on the steps, her legs ER in the last step. She felt. Did not hit her head. Complains of chest wall pain.   Noncontrasted head CT scan showed no acute intracranial process and chronic small vessel ischemic changes within the white matter and stable postsurgical changes from prior left mastoidectomy.   Chest CT without contrast showed minimally displaced right anterior sixth rib fracture and aortic atherosclerosis.   C-spine CT showed no acute cervical spine fracture, C6-7 spondylosis and postsurgical changes from prior left mastoidectomy    Mechanical fall with subsequent right side 6 rib fracture with displacement. - Pain management to be  provided. - Incentive spirometry     Generalized weakness -- PT to see patient. She is otherwise quite functional. Denies any frequent falls. Had accident fall four days ago. - TOC for discharge planning--HHPT    Dyslipidemia. -- statin therapy.  Essential hypertension. --continue Zestoretic and Toprol-XL.   Depression. - continue Prozac.     Paroxysmal atrial fibrillation. - continue Toprol-XL and Eliquis.  GERD. - on H2 blocker therapy.    Family communication : unable to reach sister or leave voicemail on the phone. Tried 2 times today on numbers provided Consults : CODE STATUS: full DVT Prophylaxis : eliquis Level of care: Med-Surg Status is: Inpatient  patient overall hemodynamically stable. Discussed discharge plan with patient she is very much agreement to go home with home health PT.  TOC informed that I am not able to reach patient's sister with the numbers listed. CONSULTS OBTAINED:    DRUG ALLERGIES:   Allergies  Allergen Reactions   Iodine Nausea And Vomiting   Levaquin [Levofloxacin] Other (See Comments)    Pt states she have nose bleeds and pain in arms and shoulders.    DISCHARGE MEDICATIONS:   Allergies as of 02/16/2021       Reactions   Iodine Nausea And Vomiting   Levaquin [levofloxacin] Other (See Comments)   Pt states she have nose bleeds and pain in arms and shoulders.        Medication List     TAKE these medications    apixaban 5 MG Tabs tablet Commonly known as: Eliquis Take 1 tablet (5 mg total) by mouth 2 (  two) times daily.   atorvastatin 80 MG tablet Commonly known as: LIPITOR Take 1 tablet (80 mg total) by mouth at bedtime.   Cholecalciferol 25 MCG (1000 UT) tablet Take 1,000 Units by mouth daily.   famotidine 20 MG tablet Commonly known as: PEPCID Take 1 tablet (20 mg total) by mouth 2 (two) times daily as needed for heartburn or indigestion.   FLUoxetine 20 MG capsule Commonly known as: PROZAC Take 1 capsule  (20 mg total) by mouth daily.   isosorbide mononitrate 60 MG 24 hr tablet Commonly known as: IMDUR Take 1 tablet (60 mg total) by mouth daily.   lisinopril-hydrochlorothiazide 20-12.5 MG tablet Commonly known as: ZESTORETIC Take 1 tablet by mouth daily.   metoprolol succinate 50 MG 24 hr tablet Commonly known as: TOPROL-XL Take 1 tablet (50 mg total) by mouth daily. Take with or immediately following a meal.   multivitamin tablet Take 1 tablet by mouth daily.        If you experience worsening of your admission symptoms, develop shortness of breath, life threatening emergency, suicidal or homicidal thoughts you must seek medical attention immediately by calling 911 or calling your MD immediately  if symptoms less severe.  You Must read complete instructions/literature along with all the possible adverse reactions/side effects for all the Medicines you take and that have been prescribed to you. Take any new Medicines after you have completely understood and accept all the possible adverse reactions/side effects.   Please note  You were cared for by a hospitalist during your hospital stay. If you have any questions about your discharge medications or the care you received while you were in the hospital after you are discharged, you can call the unit and asked to speak with the hospitalist on call if the hospitalist that took care of you is not available. Once you are discharged, your primary care physician will handle any further medical issues. Please note that NO REFILLS for any discharge medications will be authorized once you are discharged, as it is imperative that you return to your primary care physician (or establish a relationship with a primary care physician if you do not have one) for your aftercare needs so that they can reassess your need for medications and monitor your lab values. Today   SUBJECTIVE   doing well. No new complaints. Eager to go home.  VITAL SIGNS:   Blood pressure (!) 154/73, pulse 75, temperature 98 F (36.7 C), resp. rate 18, height 5' (1.524 m), weight 65.8 kg, SpO2 94 %.  I/O:   Intake/Output Summary (Last 24 hours) at 02/16/2021 1005 Last data filed at 02/15/2021 1352 Gross per 24 hour  Intake 480 ml  Output --  Net 480 ml    PHYSICAL EXAMINATION:  GENERAL:  88 y.o.-year-old patient lying in the bed with no acute distress.  LUNGS: Normal breath sounds bilaterally, no wheezing, rales,rhonchi or crepitation.  CARDIOVASCULAR: S1, S2 normal. No murmurs, rubs, or gallops.  ABDOMEN: Soft, non-tender, non-distended. Bowel sounds present.  EXTREMITIES: No  clubbing.  NEUROLOGIC: non-focal PSYCHIATRIC:  patient is alert and awake  SKIN: No obvious rash, lesion, or ulcer.   DATA REVIEW:   CBC  Recent Labs  Lab 02/14/21 2152  WBC 6.5  HGB 14.4  HCT 44.0  PLT 264    Chemistries  Recent Labs  Lab 02/14/21 2152 02/15/21 0536  NA 141 141  K 4.3 3.7  CL 102 106  CO2 28 27  GLUCOSE 119* 88  BUN  15 15  CREATININE 0.87 0.70  CALCIUM 9.4 8.9  AST 26  --   ALT 20  --   ALKPHOS 104  --   BILITOT 1.0  --     Microbiology Results   Recent Results (from the past 240 hour(s))  Resp Panel by RT-PCR (Flu A&B, Covid) Nasopharyngeal Swab     Status: None   Collection Time: 02/14/21  9:52 PM   Specimen: Nasopharyngeal Swab; Nasopharyngeal(NP) swabs in vial transport medium  Result Value Ref Range Status   SARS Coronavirus 2 by RT PCR NEGATIVE NEGATIVE Final    Comment: (NOTE) SARS-CoV-2 target nucleic acids are NOT DETECTED.  The SARS-CoV-2 RNA is generally detectable in upper respiratory specimens during the acute phase of infection. The lowest concentration of SARS-CoV-2 viral copies this assay can detect is 138 copies/mL. A negative result does not preclude SARS-Cov-2 infection and should not be used as the sole basis for treatment or other patient management decisions. A negative result may occur with  improper  specimen collection/handling, submission of specimen other than nasopharyngeal swab, presence of viral mutation(s) within the areas targeted by this assay, and inadequate number of viral copies(<138 copies/mL). A negative result must be combined with clinical observations, patient history, and epidemiological information. The expected result is Negative.  Fact Sheet for Patients:  EntrepreneurPulse.com.au  Fact Sheet for Healthcare Providers:  IncredibleEmployment.be  This test is no t yet approved or cleared by the [place] FDA and  has been authorized for detection and/or diagnosis of SARS-CoV-2 by FDA under an Emergency Use Authorization (EUA). This EUA will remain  in effect (meaning this test can be used) for the duration of the COVID-19 declaration under Section 564(b)(1) of the Act, 21 U.S.C.section 360bbb-3(b)(1), unless the authorization is terminated  or revoked sooner.       Influenza A by PCR NEGATIVE NEGATIVE Final   Influenza B by PCR NEGATIVE NEGATIVE Final    Comment: (NOTE) The Xpert Xpress SARS-CoV-2/FLU/RSV plus assay is intended as an aid in the diagnosis of influenza from Nasopharyngeal swab specimens and should not be used as a sole basis for treatment. Nasal washings and aspirates are unacceptable for Xpert Xpress SARS-CoV-2/FLU/RSV testing.  Fact Sheet for Patients: EntrepreneurPulse.com.au  Fact Sheet for Healthcare Providers: IncredibleEmployment.be  This test is not yet approved or cleared by the [place] FDA and has been authorized for detection and/or diagnosis of SARS-CoV-2 by FDA under an Emergency Use Authorization (EUA). This EUA will remain in effect (meaning this test can be used) for the duration of the COVID-19 declaration under Section 564(b)(1) of the Act, 21 U.S.C. section 360bbb-3(b)(1), unless the authorization is terminated or revoked.  Performed at  [facility], [address] [place], [place] [address]     RADIOLOGY:  CT HEAD WO CONTRAST (5MM)  Result Date: 02/14/2021 CLINICAL DATA:  [place] 4 days ago, right-sided bruising, hypertension EXAM: CT HEAD WITHOUT CONTRAST TECHNIQUE: Contiguous axial images were obtained from the base of the skull through the vertex without intravenous contrast. RADIATION DOSE REDUCTION: This exam was performed according to the departmental dose-optimization program which includes automated exposure control, adjustment of the mA and/or kV according to patient size and/or use of iterative reconstruction technique. COMPARISON:  07/29/2020 FINDINGS: Brain: Stable hypodensities within the bilateral periventricular white matter consistent with chronic small vessel ischemic change. No evidence of acute infarct or hemorrhage. Lateral ventricles and remaining midline structures are unremarkable. No acute extra-axial fluid collections. No mass effect. Vascular: No  hyperdense vessel or unexpected calcification. Skull: Normal. Negative for fracture or focal lesion. Sinuses/Orbits: No acute finding. Postsurgical changes from left mastoidectomy. Minimal mucosal thickening within the surgical defect. Other: None. IMPRESSION: 1. No acute intracranial process. 2. Stable chronic small-vessel ischemic changes within the white matter. 3. Stable postsurgical changes from prior left mastoidectomy. Electronically Signed   By: Randa Ngo M.D.   On: 02/14/2021 22:47   CT Chest Wo Contrast  Result Date: 02/14/2021 CLINICAL DATA:  Fall EXAM: CT CHEST WITHOUT CONTRAST TECHNIQUE: Multidetector CT imaging of the chest was performed following the standard protocol without IV contrast. RADIATION DOSE REDUCTION: This exam was performed according to the departmental dose-optimization program which includes automated exposure control, adjustment of the mA and/or kV according to patient size and/or use of iterative reconstruction technique.  COMPARISON:  None. FINDINGS: Cardiovascular: Coronary artery calcification and aortic atherosclerosis. Heart size is normal. No pericardial effusion. Mediastinum/Nodes: No enlarged mediastinal or axillary lymph nodes. Thyroid gland, trachea, and esophagus demonstrate no significant findings. Lungs/Pleura: Mild multifocal atelectasis. No pleural effusion or pneumothorax Upper Abdomen: Negative Musculoskeletal: Minimally displaced right anterior 6th rib fracture. IMPRESSION: 1. Minimally displaced right anterior 6th rib fracture. Aortic Atherosclerosis (ICD10-I70.0). Electronically Signed   By: Ulyses Jarred M.D.   On: 02/14/2021 22:48   CT Cervical Spine Wo Contrast  Result Date: 02/14/2021 CLINICAL DATA:  Generalized weakness for 1 month, fell 4 days ago, right-sided bruising EXAM: CT CERVICAL SPINE WITHOUT CONTRAST TECHNIQUE: Multidetector CT imaging of the cervical spine was performed without intravenous contrast. Multiplanar CT image reconstructions were also generated. RADIATION DOSE REDUCTION: This exam was performed according to the departmental dose-optimization program which includes automated exposure control, adjustment of the mA and/or kV according to patient size and/or use of iterative reconstruction technique. COMPARISON:  11/18/2015 FINDINGS: Alignment: Alignment is anatomic. Skull base and vertebrae: No acute fracture. No primary bone lesion or focal pathologic process. Stable postsurgical changes from prior left mastoidectomy. Soft tissues and spinal canal: No prevertebral fluid or swelling. No visible canal hematoma. Disc levels: Mild C6-7 spondylosis. Remaining disc spaces are well preserved. Upper chest: Airway is patent.  Lung apices are clear. Other: Reconstructed images demonstrate no additional findings. IMPRESSION: 1. No acute cervical spine fracture. 2. Postsurgical changes from prior left mastoidectomy. 3. C6-7 spondylosis. Electronically Signed   By: Randa Ngo M.D.   On:  02/14/2021 22:50     CODE STATUS:     Code Status Orders  (From admission, onward)           Start     Ordered   02/14/21 2317  Full code  Continuous        02/14/21 2320           Code Status History     Date Active Date Inactive Code Status Order ID Comments User Context   08/18/2016 2101 08/19/2016 1851 Full Code [id number]  Demetrios Loll, MD Inpatient   06/07/2014 1931 06/08/2014 1505 Full Code [id number]  Demetrios Loll, MD Inpatient        TOTAL TIME TAKING CARE OF THIS PATIENT: 35 minutes.    Fritzi Mandes M.D  [facility]    CC: Primary care physician; Delsa Grana, PA-C

## 2021-02-16 NOTE — Plan of Care (Signed)

## 2021-02-16 NOTE — Plan of Care (Signed)

## 2021-02-17 ENCOUNTER — Telehealth: Payer: Self-pay

## 2021-02-17 NOTE — Telephone Encounter (Signed)
LVM to return call to me today

## 2021-02-22 ENCOUNTER — Ambulatory Visit (INDEPENDENT_AMBULATORY_CARE_PROVIDER_SITE_OTHER): Payer: Medicare HMO | Admitting: Internal Medicine

## 2021-02-22 ENCOUNTER — Telehealth: Payer: Self-pay | Admitting: Family Medicine

## 2021-02-22 ENCOUNTER — Other Ambulatory Visit: Payer: Self-pay

## 2021-02-22 VITALS — BP 116/62 | HR 96 | Temp 98.3°F | Resp 16 | Ht 60.0 in | Wt 145.6 lb

## 2021-02-22 DIAGNOSIS — I10 Essential (primary) hypertension: Secondary | ICD-10-CM

## 2021-02-22 DIAGNOSIS — I25118 Atherosclerotic heart disease of native coronary artery with other forms of angina pectoris: Secondary | ICD-10-CM | POA: Diagnosis not present

## 2021-02-22 DIAGNOSIS — K2100 Gastro-esophageal reflux disease with esophagitis, without bleeding: Secondary | ICD-10-CM | POA: Diagnosis not present

## 2021-02-22 DIAGNOSIS — W19XXXD Unspecified fall, subsequent encounter: Secondary | ICD-10-CM

## 2021-02-22 DIAGNOSIS — H9193 Unspecified hearing loss, bilateral: Secondary | ICD-10-CM | POA: Diagnosis not present

## 2021-02-22 DIAGNOSIS — S2231XD Fracture of one rib, right side, subsequent encounter for fracture with routine healing: Secondary | ICD-10-CM

## 2021-02-22 DIAGNOSIS — R69 Illness, unspecified: Secondary | ICD-10-CM | POA: Diagnosis not present

## 2021-02-22 DIAGNOSIS — E785 Hyperlipidemia, unspecified: Secondary | ICD-10-CM

## 2021-02-22 DIAGNOSIS — I4891 Unspecified atrial fibrillation: Secondary | ICD-10-CM | POA: Diagnosis not present

## 2021-02-22 DIAGNOSIS — R262 Difficulty in walking, not elsewhere classified: Secondary | ICD-10-CM

## 2021-02-22 DIAGNOSIS — G4710 Hypersomnia, unspecified: Secondary | ICD-10-CM

## 2021-02-22 DIAGNOSIS — F339 Major depressive disorder, recurrent, unspecified: Secondary | ICD-10-CM

## 2021-02-22 MED ORDER — LIDOCAINE 4 % EX PTCH: 1.0000 | MEDICATED_PATCH | Freq: Every day | CUTANEOUS | 1 refills | Status: DC

## 2021-02-22 MED ORDER — MELATONIN 1 MG PO TABS: 1.0000 mg | ORAL_TABLET | Freq: Every day | ORAL | 3 refills | Status: DC

## 2021-02-22 NOTE — Telephone Encounter (Signed)
Called Breanne back and relayed Dr.Andrews message.

## 2021-02-22 NOTE — Telephone Encounter (Signed)
Home Health Verbal Orders - Caller/Agency: [facility], [place] Number: [phone] Requesting OT/PT/Skilled Nursing/Social Work/Speech Therapy: PT  Frequency:   Wants to delay start of care until tomorrow January 24th 2023 (PT)

## 2021-02-22 NOTE — Progress Notes (Signed)
Established Patient Office Visit  Subjective:  Patient ID: Samantha [name], female    DOB: Dec 18, 1928  Age: 88 y.o. MRN: [id number]  CC:  Chief Complaint  Patient presents with   Medication Refill   Hospitalization Follow-up    Fall with rib fracture on 1/15   Hyperlipidemia   Hypertension    HPI Samantha [name] presents for follow up on chronic medical conditions and ED follow up.   Discharge Date: 02/14/21 Hospital/facility: [facility] Diagnosis: Closed fracture right rib, weakness, mechanical fall Procedures/tests: Chest CT showing minimally displaced right anterior sixth rib fracture. CT cervical spine negative for acute fracture  Consultants: none New medications: none Discontinued medications: none Status: better Right knee gave out, was swollen, mild pain, better, fell face forward. Mild rib pain yesterday   Hypertension/PAF: -Medications: Lisinopril-HCTZ 20-12.5, Metoprolol 50, Imdur 60, Eliquis 5 BID -Patient is compliant with above medications and reports no side effects. -Checking BP at home (average): doesn't check -Denies any SOB, CP, vision changes, LE edema or symptoms of hypotension  HLD: -Medications: Lipitor 80 -Patient is compliant with above medications and reports no side effects.  -Last lipid panel: 5/22: TC 179, HDL 35, triglycerides 377, LDL 94  GERD: -Currently on Pepcid 20 mg   MDD/Hypersomnolence: -Currently on Prozac 20, which at last visit 6 months ago had been decreased due to hypersomnolence -Last 3 months worse, will sleep multiple hours in the day, cannot sleep at night. 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, would like referral to ENT for complete exam   Past Medical History:  Diagnosis Date   A-fib ([place]) 06/07/2014   Arm fracture 03/10/2017   right   CAD (coronary artery disease)  06/07/2014   Closed right radial fracture 03/21/2017   Depression 09/24/2015   Dyslipidemia 08/16/2016   Dysphonia 08/16/2016   Hypertension    Mild aortic stenosis 08/22/2016   Echo July 2018   Mitral regurgitation 08/22/2016   Echo July 2018   Stroke [facility])     Past Surgical History:  Procedure Laterality Date   ABDOMINAL HYSTERECTOMY     APPENDECTOMY     CHOLECYSTECTOMY     FRACTURE SURGERY      Family History  Problem Relation Age of Onset   Stroke Mother    Heart attack Father    Heart disease Sister    Diabetes Sister    Heart disease Brother    Heart attack Brother    Diabetes Maternal Grandfather    Ovarian cancer Paternal Grandmother    Cancer Brother        bladder cancer   Diabetes Brother    Heart disease Brother    Hypertension Brother    Heart attack Daughter     Social History   Socioeconomic History   Marital status: Widowed    Spouse name: Not on file   Number of children: 3   Years of education: Not on file   Highest education level: Not on file  Occupational History   Not on file  Tobacco Use   Smoking status: Never   Smokeless tobacco: Never  Vaping Use   Vaping Use: Never used  Substance and Sexual Activity   Alcohol use: No   Drug use: No   Sexual activity: Never  Other Topics Concern   Not on file  Social  History Narrative   Only has 1 living child left   Social Determinants of Radio broadcast assistant Strain: Low Risk    Difficulty of Paying Living Expenses: Not very hard  Food Insecurity: No Food Insecurity   Worried About Charity fundraiser in the Last Year: Never true   Arboriculturist in the Last Year: Never true  Transportation Needs: Public librarian (Medical): Yes   Lack of Transportation (Non-Medical): Yes  Physical Activity: Inactive   Days of Exercise per Week: 0 days   Minutes of Exercise per Session: 0 min  Stress: No Stress Concern Present   Feeling of Stress : Not at all   Social Connections: Moderately Isolated   Frequency of Communication with Friends and Family: More than three times a week   Frequency of Social Gatherings with Friends and Family: Three times a week   Attends Religious Services: More than 4 times per year   Active Member of Clubs or Organizations: No   Attends Archivist Meetings: Never   Marital Status: Widowed  Human resources officer Violence: Not At Risk   Fear of Current or Ex-Partner: No   Emotionally Abused: No   Physically Abused: No   Sexually Abused: No    Outpatient Medications Prior to Visit  Medication Sig Dispense Refill   apixaban (ELIQUIS) 5 MG TABS tablet Take 1 tablet (5 mg total) by mouth 2 (two) times daily. 60 tablet 0   atorvastatin (LIPITOR) 80 MG tablet Take 1 tablet (80 mg total) by mouth at bedtime. 90 tablet 2   Cholecalciferol 25 MCG (1000 UT) tablet Take 1,000 Units by mouth daily.     famotidine (PEPCID) 20 MG tablet Take 1 tablet (20 mg total) by mouth 2 (two) times daily as needed for heartburn or indigestion. 180 tablet 3   FLUoxetine (PROZAC) 20 MG capsule Take 1 capsule (20 mg total) by mouth daily. 90 capsule 3   isosorbide mononitrate (IMDUR) 60 MG 24 hr tablet Take 1 tablet (60 mg total) by mouth daily. 90 tablet 0   lisinopril-hydrochlorothiazide (ZESTORETIC) 20-12.5 MG tablet Take 1 tablet by mouth daily. 30 tablet 0   metoprolol succinate (TOPROL-XL) 50 MG 24 hr tablet Take 1 tablet (50 mg total) by mouth daily. Take with or immediately following a meal. 90 tablet 0   Multiple Vitamin (MULTIVITAMIN) tablet Take 1 tablet by mouth daily.     No facility-administered medications prior to visit.    Allergies  Allergen Reactions   Iodine Nausea And Vomiting   Levaquin [Levofloxacin] Other (See Comments)    Pt states she have nose bleeds and pain in arms and shoulders.    ROS Review of Systems  Constitutional:  Negative for chills and fever.  HENT:  Positive for hearing loss. Negative  for ear discharge and ear pain.   Eyes:  Negative for visual disturbance.  Respiratory:  Negative for cough.   Cardiovascular:  Negative for chest pain and palpitations.  Gastrointestinal:  Negative for abdominal pain.  Neurological:  Negative for dizziness and headaches.  Psychiatric/Behavioral:  Positive for sleep disturbance.      Objective:    Physical Exam Constitutional:      Appearance: Normal appearance.  HENT:     Head: Normocephalic and atraumatic.  Eyes:     Conjunctiva/sclera: Conjunctivae normal.  Cardiovascular:     Rate and Rhythm: Normal rate and regular rhythm.  Pulmonary:     Effort:  Pulmonary effort is normal.     Breath sounds: Normal breath sounds.  Musculoskeletal:     Right lower leg: No edema.     Left lower leg: No edema.  Skin:    General: Skin is warm and dry.  Neurological:     General: No focal deficit present.     Mental Status: She is alert. Mental status is at baseline.  Psychiatric:        Mood and Affect: Mood normal.        Behavior: Behavior normal.    BP 116/62    Pulse 96    Temp 98.3 F (36.8 C)    Resp 16    Ht 5' (1.524 m)    Wt 145 lb 9.6 oz (66 kg)    SpO2 98%    BMI 28.44 kg/m  Wt Readings from Last 3 Encounters:  02/22/21 145 lb 9.6 oz (66 kg)  02/14/21 145 lb (65.8 kg)  07/07/20 140 lb (63.5 kg)     Health Maintenance Due  Topic Date Due   Zoster Vaccines- Shingrix (1 of 2) Never done   COVID-19 Vaccine (4 - Booster for Moderna series) 01/30/2020   INFLUENZA VACCINE  08/31/2020    There are no preventive care reminders to display for this patient.  Lab Results  Component Value Date   TSH 2.98 06/23/2020   Lab Results  Component Value Date   WBC 6.5 02/14/2021   HGB 14.4 02/14/2021   HCT 44.0 02/14/2021   MCV 95.0 02/14/2021   PLT 264 02/14/2021   Lab Results  Component Value Date   NA 141 02/15/2021   K 3.7 02/15/2021   CO2 27 02/15/2021   GLUCOSE 88 02/15/2021   BUN 15 02/15/2021   CREATININE 0.70  02/15/2021   BILITOT 1.0 02/14/2021   ALKPHOS 104 02/14/2021   AST 26 02/14/2021   ALT 20 02/14/2021   PROT 7.1 02/14/2021   ALBUMIN 3.4 (L) 02/14/2021   CALCIUM 8.9 02/15/2021   ANIONGAP 8 02/15/2021   Lab Results  Component Value Date   CHOL 179 06/23/2020   Lab Results  Component Value Date   HDL 35 (L) 06/23/2020   Lab Results  Component Value Date   LDLCALC 94 06/23/2020   Lab Results  Component Value Date   TRIG 377 (H) 06/23/2020   Lab Results  Component Value Date   CHOLHDL 5.1 (H) 06/23/2020   Lab Results  Component Value Date   HGBA1C 5.7 (H) 05/08/2019      Assessment & Plan:   1. Closed fracture of one rib of right side with routine healing, subsequent encounter: Doing better, pain controlled with occasional Tylenol, will prescribe Lidocaine patch as well.   - Lidocaine (HM LIDOCAINE PATCH) 4 % PTCH; Apply 1 patch topically daily.  Dispense: 30 patch; Refill: 1  2. Hypersomnia: Discussed at length, she is not sleeping at night because she is sleeping all day. Normal at her age to be tired after activity but recommend trying not to nap during the day and taking a low dose Melatonin at night. Discussed sleep hygiene practices.   - melatonin 1 MG TABS tablet; Take 1 tablet (1 mg total) by mouth at bedtime.  Dispense: 30 tablet; Refill: 3  3. Bilateral hearing loss, unspecified hearing loss type: Patient requesting referral to ENT.   - Ambulatory referral to ENT  4. Primary hypertension/Atrial fibrillation, unspecified type ([place]): Stable, continue medications.   5. Dyslipidemia/Athscl heart disease of native  cor art w oth ang pctrs ([place]): Stable, continue statin.  6. Gastroesophageal reflux disease with esophagitis, unspecified whether hemorrhage: Stable, continue   7. Recurrent major depressive disorder, remission status unspecified ([place]): Stable, doing well on lower dose of Prozac but hasn't noticed any change in sleep.   Follow-up: Return in about  6 months (around 08/22/2021).    Teodora Medici, DO

## 2021-02-22 NOTE — Patient Instructions (Addendum)
It was great seeing you today!  Plan discussed at today's visit: -Continue medications -Referral placed to ENT -Try Melatonin 0.5 - 1 mg 30 minutes before sleep  -Lidocaine patches sent for rib pain, can take Tylenol as well.   Follow up in: 6 months   Take care and let us know if you have any questions or concerns prior to your next visit.  Dr. Rosana Berger  Hypersomnia Hypersomnia is a condition in which a person feels very tired during the day even though he or she gets plenty of sleep at night. A person with this condition may take naps during the day and may find it very difficult to wake up from sleep. Hypersomnia may affect a person's ability to think, concentrate, drive, or remember things. What are the causes? The cause of this condition may not be known. Possible causes include: Certain medicines. Sleep disorders, such as narcolepsy and sleep apnea. Injury to the head, brain, or spinal cord. Drug or alcohol use. Gastroesophageal reflux disease (GERD). Tumors. Certain medical conditions, such as depression, diabetes, or an underactive thyroid gland (hypothyroidism). What are the signs or symptoms? The main symptoms of hypersomnia include: Feeling very tired throughout the day, regardless of how much sleep you got the night before. Having trouble waking up. Others may find it difficult to wake you up when you are sleeping. Sleeping for longer and longer periods at a time. Taking naps throughout the day. Other symptoms may include: Feeling restless, anxious, or annoyed. Lacking energy. Having trouble with: Remembering. Speaking. Thinking. Loss of appetite. Seeing, hearing, tasting, smelling, or feeling things that are not real (hallucinations). How is this diagnosed? This condition may be diagnosed based on: Your symptoms and medical history. Your sleeping habits. Your health care provider may ask you to write down your sleeping habits in a daily sleep log, along with any  symptoms you have. A series of tests that are done while you sleep (sleep study or polysomnogram). A test that measures how quickly you can fall asleep during the day (daytime nap study or multiple sleep latency test). How is this treated? Treatment can help you manage your condition. Treatment may include: Following a regular sleep routine. Lifestyle changes, such as changing your eating habits, getting regular exercise, and avoiding alcohol or caffeinated beverages. Taking medicines to make you more alert (stimulants) during the day. Treating any underlying medical causes of hypersomnia. Follow these instructions at home: Sleep routine  Schedule the same bedtime and wake-up time each day. Practice a relaxing bedtime routine. This may include reading, meditation, deep breathing, or taking a warm bath before going to sleep. Get regular exercise each day. Avoid strenuous exercise in the evening hours. Keep your sleep environment at a cooler temperature, darkened, and quiet. Sleep with pillows and a mattress that are comfortable and supportive. Schedule short 20-minute naps for when you feel sleepiest during the day. Talk with your employer or teachers about your hypersomnia. If possible, adjust your schedule so that: You have a regular daytime work schedule. You can take a scheduled nap during the day. You do not have to work or be active at night. Do not eat a heavy meal for a few hours before bedtime. Eat your meals at about the same times every day. Avoid drinking alcohol or caffeinated beverages. Safety  Do not drive or use heavy machinery if you are sleepy. Ask your health care provider if it is safe for you to drive. Wear a life jacket when swimming or  spending time near water. General instructions Take supplements and over-the-counter and prescription medicines only as told by your health care provider. Keep a sleep log that will help your doctor manage your condition. This may  include information about: What time you go to bed each night. How often you wake up at night. How many hours you sleep at night. How often and for how long you nap during the day. Any observations from others, such as leg movements during sleep, sleep walking, or snoring. Keep all follow-up visits as told by your health care provider. This is important. Contact a health care provider if: You have new symptoms. Your symptoms get worse. Get help right away if: You have serious thoughts about hurting yourself or someone else. If you ever feel like you may hurt yourself or others, or have thoughts about taking your own life, get help right away. You can go to your nearest emergency department or call: Your local emergency services (911 in the [place] A suicide crisis helpline, such as the [facility] at 567-138-6281 or 988 in the [place]. This is open 24 hours a day. Summary Hypersomnia refers to a condition in which you feel very tired during the day even though you get plenty of sleep at night. A person with this condition may take naps during the day and may find it very difficult to wake up from sleep. Hypersomnia may affect a person's ability to think, concentrate, drive, or remember things. Treatment, such as following a regular sleep routine and making some lifestyle changes, can help you manage your condition. This information is not intended to replace advice given to you by your health care provider. Make sure you discuss any questions you have with your health care provider. Document Revised: 08/12/2020 Document Reviewed: 11/28/2019 Elsevier Patient Education  2022 [employer].

## 2021-02-23 NOTE — Telephone Encounter (Signed)
FYI

## 2021-02-23 NOTE — Telephone Encounter (Signed)
Landry Mellow called to report that the patient is refusing home care services.

## 2021-02-23 NOTE — Telephone Encounter (Signed)
Called Bre[facility] back and states if patient refuses they just respect patient decisions. No longer insists but she did leave a phone number to pt just in case she changes her mind can call back to them.

## 2021-02-23 NOTE — Telephone Encounter (Signed)
Called [facility] no answer, left vm to call back.

## 2021-02-25 ENCOUNTER — Other Ambulatory Visit: Payer: Self-pay | Admitting: Family Medicine

## 2021-02-25 DIAGNOSIS — I10 Essential (primary) hypertension: Secondary | ICD-10-CM

## 2021-03-19 ENCOUNTER — Other Ambulatory Visit: Payer: Self-pay | Admitting: Unknown Physician Specialty

## 2021-03-19 DIAGNOSIS — I4891 Unspecified atrial fibrillation: Secondary | ICD-10-CM

## 2021-03-19 DIAGNOSIS — I25111 Atherosclerotic heart disease of native coronary artery with angina pectoris with documented spasm: Secondary | ICD-10-CM

## 2021-03-20 NOTE — Telephone Encounter (Signed)
Requested Prescriptions  Pending Prescriptions Disp Refills   isosorbide mononitrate (IMDUR) 60 MG 24 hr tablet [Pharmacy Med Name: ISOSORBIDE MONONIT ER 60 MG TB] 90 tablet 1    Sig: Take 1 tablet (60 mg total) by mouth daily.     Cardiovascular:  Nitrates Passed - 03/19/2021  1:25 PM      Passed - Last BP in normal range    BP Readings from Last 1 Encounters:  02/22/21 116/62         Passed - Last Heart Rate in normal range    Pulse Readings from Last 1 Encounters:  02/22/21 96         Passed - Valid encounter within last 12 months    Recent Outpatient Visits          3 weeks ago Hypersomnia   [place], DO   8 months ago Nonintractable headache, unspecified chronicity pattern, unspecified headache type   [place], NP   9 months ago Hypersomnia   [place], NP   1 year ago Essential hypertension   [facility] Delsa Grana, PA-C   2 years ago Essential hypertension   [place], [place]      Future Appointments            In 5 months Teodora Medici, [facility], [place]   In 7 months  [facility], [facility]

## 2021-03-24 DIAGNOSIS — H90A32 Mixed conductive and sensorineural hearing loss, unilateral, left ear with restricted hearing on the contralateral side: Secondary | ICD-10-CM | POA: Diagnosis not present

## 2021-03-24 DIAGNOSIS — H6121 Impacted cerumen, right ear: Secondary | ICD-10-CM | POA: Diagnosis not present

## 2021-03-24 DIAGNOSIS — H7012 Chronic mastoiditis, left ear: Secondary | ICD-10-CM | POA: Diagnosis not present

## 2021-03-25 ENCOUNTER — Encounter: Payer: Self-pay | Admitting: Unknown Physician Specialty

## 2021-03-26 ENCOUNTER — Other Ambulatory Visit: Payer: Self-pay | Admitting: Internal Medicine

## 2021-03-26 DIAGNOSIS — I10 Essential (primary) hypertension: Secondary | ICD-10-CM

## 2021-03-26 NOTE — Telephone Encounter (Signed)
Requested Prescriptions  Pending Prescriptions Disp Refills   lisinopril-hydrochlorothiazide (ZESTORETIC) 20-12.5 MG tablet [Pharmacy Med Name: LISINOPRIL-HCTZ 20-12.5 MG TAB] 30 tablet 3    Sig: Take 1 tablet by mouth daily.     Cardiovascular:  ACEI + Diuretic Combos Passed - 03/26/2021 12:55 PM      Passed - Na in normal range and within 180 days    Sodium  Date Value Ref Range Status  02/15/2021 141 135 - 145 mmol/L Final  04/04/2013 138 136 - 145 mmol/L Final         Passed - K in normal range and within 180 days    Potassium  Date Value Ref Range Status  02/15/2021 3.7 3.5 - 5.1 mmol/L Final  04/04/2013 4.3 3.5 - 5.1 mmol/L Final         Passed - Cr in normal range and within 180 days    Creat  Date Value Ref Range Status  06/23/2020 0.91 (H) 0.60 - 0.88 mg/dL Final    Comment:    For patients >8 years of age, the reference limit for Creatinine is approximately 13% higher for people identified as African-American. .    Creatinine, Ser  Date Value Ref Range Status  02/15/2021 0.70 0.44 - 1.00 mg/dL Final         Passed - eGFR is 30 or above and within 180 days    GFR, Est African American  Date Value Ref Range Status  05/08/2019 71 > OR = 60 mL/min/1.73m Final   GFR, Est Non African American  Date Value Ref Range Status  05/08/2019 61 > OR = 60 mL/min/1.762mFinal   GFR, Estimated  Date Value Ref Range Status  02/15/2021 >60 >60 mL/min Final    Comment:    (NOTE) Calculated using the CKD-EPI Creatinine Equation (2021)          Passed - Patient is not pregnant      Passed - Last BP in normal range    BP Readings from Last 1 Encounters:  02/22/21 116/62         Passed - Valid encounter within last 6 months    Recent Outpatient Visits          [place]   8 months ago Nonintractable headache, unspecified chronicity pattern, unspecified headache type   [place]   9 months ago [place]ersomnia   [place]   1 year ago [facility]ential hypertension   [facility]   2 years ago Essential hypertension   [place]    Future Appointments            [facility]  [facility]

## 2021-03-26 NOTE — Telephone Encounter (Signed)
Copied from [place] #[id number]. Topic: General - Other >> Mar 26, 2021 12:44 PM Yvette Rack wrote: Reason for CRM: Pt stated she needs a letter from her doctor to continue to have her mailed delivered to her mailbox on her porch instead of on the street. Pt stated due to her condition she can not get down the stairs to get to the mailbox on the street. Pt requests call back

## 2021-03-31 ENCOUNTER — Telehealth: Payer: Self-pay

## 2021-03-31 NOTE — Telephone Encounter (Signed)
Copied from [place] #[id number]. Topic: General - Other ?>> Mar 26, 2021 12:44 PM Yvette Rack wrote: ?Reason for CRM: Pt stated she needs a letter from her doctor to continue to have her mailed delivered to her mailbox on her porch instead of on the street. Pt stated due to her condition she can not get down the stairs to get to the mailbox on the street. Pt requests call back ?>> Mar 31, 2021  2:26 PM Alanda Slim E wrote: ?Pts neighbor called about getting the letter for the pt to received her mail in her porch mailbox instead of her street mailbox due to falls / she asked if this letter can be mailed to the pts home address / please advise  ?

## 2021-04-01 ENCOUNTER — Encounter: Payer: Self-pay | Admitting: Internal Medicine

## 2021-04-01 ENCOUNTER — Telehealth: Payer: Self-pay

## 2021-04-01 ENCOUNTER — Telehealth: Payer: Self-pay | Admitting: Internal Medicine

## 2021-04-01 NOTE — Telephone Encounter (Signed)
Letter sent.

## 2021-04-01 NOTE — Telephone Encounter (Signed)
Copied from [place] [phone]. Topic: General - Other >> Mar 31, 2021  3:03 PM Pawlus, Brayton Layman A wrote: Reason for CRM: Pt representative from [facility] services called in to follow up on a form that was faxed over regarding the pt, please advise.  - Direct number - [phone].

## 2021-04-01 NOTE — Telephone Encounter (Signed)
Sorry it was also for her mail not just medications/medical supplies. ?

## 2021-04-02 ENCOUNTER — Encounter: Payer: Self-pay | Admitting: Unknown Physician Specialty

## 2021-04-02 ENCOUNTER — Encounter
Admission: RE | Disposition: A | Discharge status: Home / Self Care | Payer: Self-pay | Source: Home / Self Care | Attending: Unknown Physician Specialty

## 2021-04-02 ENCOUNTER — Other Ambulatory Visit: Payer: Self-pay

## 2021-04-02 ENCOUNTER — Ambulatory Visit
Admission: RE | Admit: 2021-04-02 | Discharge: 2021-04-02 | Disposition: A | Discharge status: Home / Self Care | Payer: Medicare HMO | Attending: Unknown Physician Specialty | Admitting: Unknown Physician Specialty

## 2021-04-02 ENCOUNTER — Ambulatory Visit: Payer: Medicare HMO | Admitting: Anesthesiology

## 2021-04-02 DIAGNOSIS — R69 Illness, unspecified: Secondary | ICD-10-CM | POA: Diagnosis not present

## 2021-04-02 DIAGNOSIS — I2510 Atherosclerotic heart disease of native coronary artery without angina pectoris: Secondary | ICD-10-CM | POA: Insufficient documentation

## 2021-04-02 DIAGNOSIS — H7012 Chronic mastoiditis, left ear: Secondary | ICD-10-CM | POA: Diagnosis not present

## 2021-04-02 DIAGNOSIS — I4891 Unspecified atrial fibrillation: Secondary | ICD-10-CM | POA: Diagnosis not present

## 2021-04-02 DIAGNOSIS — I10 Essential (primary) hypertension: Secondary | ICD-10-CM | POA: Diagnosis not present

## 2021-04-02 DIAGNOSIS — H6122 Impacted cerumen, left ear: Secondary | ICD-10-CM | POA: Diagnosis not present

## 2021-04-02 DIAGNOSIS — F32A Depression, unspecified: Secondary | ICD-10-CM | POA: Diagnosis not present

## 2021-04-02 DIAGNOSIS — K219 Gastro-esophageal reflux disease without esophagitis: Secondary | ICD-10-CM | POA: Diagnosis not present

## 2021-04-02 HISTORY — DX: Dizziness and giddiness: R42

## 2021-04-02 HISTORY — DX: Presence of external hearing-aid: Z97.4

## 2021-04-02 SURGERY — EXAM UNDER ANESTHESIA
Anesthesia: General | Site: Ear | Laterality: Left

## 2021-04-02 MED ORDER — LIDOCAINE HCL (CARDIAC) PF 100 MG/5ML IV SOSY
PREFILLED_SYRINGE | INTRAVENOUS | Status: DC | PRN
  Administered 2021-04-02: 25 mg via INTRAVENOUS

## 2021-04-02 MED ORDER — PROPOFOL 10 MG/ML IV BOLUS
INTRAVENOUS | Status: DC | PRN
  Administered 2021-04-02: 70 mg via INTRAVENOUS

## 2021-04-02 MED ORDER — GLYCOPYRROLATE 0.2 MG/ML IJ SOLN
INTRAMUSCULAR | Status: DC | PRN
  Administered 2021-04-02: .1 mg via INTRAVENOUS

## 2021-04-02 MED ORDER — LACTATED RINGERS IV SOLN: INTRAVENOUS | Status: DC

## 2021-04-02 MED ORDER — CIPROFLOXACIN-DEXAMETHASONE 0.3-0.1 % OT SUSP
OTIC | Status: DC | PRN
  Administered 2021-04-02: 1 [drp] via OTIC

## 2021-04-02 MED ORDER — ONDANSETRON HCL 4 MG/2ML IJ SOLN
INTRAMUSCULAR | Status: DC | PRN
Start: 2021-04-02 — End: 2021-04-02
  Administered 2021-04-02: 4 mg via INTRAVENOUS

## 2021-04-02 SURGICAL SUPPLY — 9 items
BALL CTTN LRG ABS STRL LF (GAUZE/BANDAGES/DRESSINGS) ×1
CANISTER SUCT 1200ML W/VALVE (MISCELLANEOUS) ×1 IMPLANT
COTTONBALL LRG STERILE PKG (GAUZE/BANDAGES/DRESSINGS) ×1 IMPLANT
GLOVE SURG ENC TEXT LTX SZ7.5 (GLOVE) ×2 IMPLANT
KIT TURNOVER KIT A (KITS) ×2 IMPLANT
STRAP BODY AND KNEE 60X3 (MISCELLANEOUS) ×2 IMPLANT
TOWEL OR 17X26 4PK STRL BLUE (TOWEL DISPOSABLE) ×2 IMPLANT
TUBING CONN 6MMX3.1M (TUBING) ×2
TUBING SUCTION CONN 0.25 STRL (TUBING) IMPLANT

## 2021-04-02 NOTE — Anesthesia Procedure Notes (Signed)
Procedure Name: General with mask airway ?Date/Time: 04/02/2021 8:21 AM ?Performed by: Mayme Genta, CRNA ?Pre-anesthesia Checklist: Patient identified, Patient being monitored, Emergency Drugs available, Timeout performed and Suction available ?Patient Re-evaluated:Patient Re-evaluated prior to induction ?Oxygen Delivery Method: Circle system utilized ?Preoxygenation: Pre-oxygenation with 100% oxygen ?Induction Type: Combination inhalational/ intravenous induction ?Ventilation: Mask ventilation without difficulty ?Dental Injury: Teeth and Oropharynx as per pre-operative assessment  ? ? ? ? ?

## 2021-04-02 NOTE — Anesthesia Preprocedure Evaluation (Signed)
Anesthesia Evaluation  ?Patient identified by MRN, date of birth, ID band ?Patient awake ? ? ? ?Reviewed: ?Allergy & Precautions, NPO status  ? ?Airway ?Mallampati: II ? ?TM Distance: >3 FB ? ? ? ? Dental ?  ?Pulmonary ? ?  ?Pulmonary exam normal ? ? ? ? ? ? ? Cardiovascular ?hypertension, + CAD  ?+ dysrhythmias (hx afib)  ?Rhythm:Regular Rate:Normal ? ?HLD ?Mild AS ?  ?Neuro/Psych ?PSYCHIATRIC DISORDERS Depression CVA   ? GI/Hepatic ?GERD  ,  ?Endo/Other  ? ? Renal/GU ?  ? ?  ?Musculoskeletal ? ? Abdominal ?  ?Peds ? Hematology ?  ?Anesthesia Other Findings ? ? Reproductive/Obstetrics ? ?  ? ? ? ? ? ? ? ? ? ? ? ? ? ?  ?  ? ? ? ? ? ? ? ? ?Anesthesia Physical ?Anesthesia Plan ? ?ASA: 3 ? ?Anesthesia Plan: General  ? ?Post-op Pain Management:   ? ?Induction: Intravenous ? ?PONV Risk Score and Plan: Propofol infusion, TIVA and Treatment may vary due to age or medical condition ? ?Airway Management Planned: Natural Airway and Nasal Cannula ? ?Additional Equipment:  ? ?Intra-op Plan:  ? ?Post-operative Plan:  ? ?Informed Consent: I have reviewed the patients History and Physical, chart, labs and discussed the procedure including the risks, benefits and alternatives for the proposed anesthesia with the patient or authorized representative who has indicated his/her understanding and acceptance.  ? ? ? ? ? ?Plan Discussed with: CRNA ? ?Anesthesia Plan Comments:   ? ? ? ? ? ? ?Anesthesia Quick Evaluation ? ?

## 2021-04-02 NOTE — Transfer of Care (Signed)
Immediate Anesthesia Transfer of Care Note ? ?Patient: Samantha [name] ? ?Procedure(s) Performed: EXAM UNDER ANESTHESIA WITH REMOVAL OF IMPACTED CERUMEN (Left: Ear) ? ?Patient Location: PACU ? ?Anesthesia Type: General ? ?Level of Consciousness: awake, alert  and patient cooperative ? ?Airway and Oxygen Therapy: Patient Spontanous Breathing and Patient connected to supplemental oxygen ? ?Post-op Assessment: Post-op Vital signs reviewed, Patient's Cardiovascular Status Stable, Respiratory Function Stable, Patent Airway and No signs of Nausea or vomiting ? ?Post-op Vital Signs: Reviewed and stable ? ?Complications: No notable events documented. ? ?

## 2021-04-02 NOTE — Anesthesia Postprocedure Evaluation (Signed)
Anesthesia Post Note ? ?Patient: Samantha [name] ? ?Procedure(s) Performed: EXAM UNDER ANESTHESIA WITH REMOVAL OF IMPACTED CERUMEN (Left: Ear) ? ? ?  ?Patient location during evaluation: PACU ?Anesthesia Type: General ?Level of consciousness: awake ?Pain management: pain level controlled ?Vital Signs Assessment: post-procedure vital signs reviewed and stable ?Respiratory status: respiratory function stable ?Cardiovascular status: stable ?Postop Assessment: no signs of nausea or vomiting ?Anesthetic complications: no ? ? ?No notable events documented. ? ?Veda Canning ? ? ? ? ? ?

## 2021-04-02 NOTE — H&P (Signed)
The patient's history has been reviewed, patient examined, no change in status, stable for surgery.  Questions were answered to the patients satisfaction.  

## 2021-04-02 NOTE — Op Note (Signed)
04/02/2021 ? ?8:26 AM ? ? ? ?Banet, Moira ? ?[id number] ? ? ?Pre-Op Dx: Cerumen impaction; chronic mastoiditis ? ?Post-op Dx: SAME ? ?Proc: Left mastoid bowl cleaning with debridement of squamous debris and cerumen ? ?Surg:  Roena Malady ? ?Anes:  GOT ? ?EBL: 0 ? ?Comp: None ? ?Findings: Large cerumen impaction and squamous epithelial debris impacted into the left mastoid cavity ? ?Procedure: Dub Mikes was identified in the holding area taken the operating room placed in supine position.  After general IV sedation and mask anesthesia the operating microscope was brought into the field.  On the left-hand side the patient had undergone a previous canal wall down tympanomastoidectomy many years ago.  Entire mastoid bowl was filled with squamous debris and cerumen.  This was gently removed using a alligator forcep and a right angle hook.  This removed all of the debris from the mastoid cavity and the ear canal.  Per Dex drops were then instilled in the mastoid bowl followed by cottonball.  The right ear was examined was clear patient was then returned to anesthesia where she was awakened in the operating room and taken recovery room in stable condition. ? ?Dispo:   Good ? ?Plan: Discharged home follow-up 6 weeks ? ?Roena Malady ? ?04/02/2021 ?8:26 AM ?  ?

## 2021-04-08 ENCOUNTER — Ambulatory Visit (INDEPENDENT_AMBULATORY_CARE_PROVIDER_SITE_OTHER): Payer: Medicare HMO | Admitting: Internal Medicine

## 2021-04-08 ENCOUNTER — Encounter: Payer: Self-pay | Admitting: Internal Medicine

## 2021-04-08 VITALS — BP 120/72 | HR 84 | Temp 98.1°F | Resp 16 | Ht 60.0 in | Wt 148.1 lb

## 2021-04-08 DIAGNOSIS — I4891 Unspecified atrial fibrillation: Secondary | ICD-10-CM

## 2021-04-08 DIAGNOSIS — W19XXXD Unspecified fall, subsequent encounter: Secondary | ICD-10-CM

## 2021-04-08 DIAGNOSIS — K2100 Gastro-esophageal reflux disease with esophagitis, without bleeding: Secondary | ICD-10-CM

## 2021-04-08 DIAGNOSIS — I25111 Atherosclerotic heart disease of native coronary artery with angina pectoris with documented spasm: Secondary | ICD-10-CM | POA: Diagnosis not present

## 2021-04-08 DIAGNOSIS — I10 Essential (primary) hypertension: Secondary | ICD-10-CM | POA: Diagnosis not present

## 2021-04-08 DIAGNOSIS — R262 Difficulty in walking, not elsewhere classified: Secondary | ICD-10-CM | POA: Insufficient documentation

## 2021-04-08 DIAGNOSIS — H9192 Unspecified hearing loss, left ear: Secondary | ICD-10-CM | POA: Diagnosis not present

## 2021-04-08 NOTE — Assessment & Plan Note (Signed)
Stable, continue current medications.  

## 2021-04-08 NOTE — Assessment & Plan Note (Signed)
Primary diagnosis for VA assistance, papers filled out and returned to patient. ?

## 2021-04-08 NOTE — Progress Notes (Signed)
Established Patient Office Visit  Subjective:  Patient ID: Samantha [name], female    DOB: 29-May-1928  Age: 88 y.o. MRN: [id number]  CC:  Chief Complaint  Patient presents with   paperwork    For in home nurse/assistance    HPI Samantha [name] presents for paperwork. Needs forms filled out for [facility] assistance.   Right Rib Fracture: -Sustained after mechanical fall in January -Pain control with Lidocaine patch, Tylenol, etc. -Doing better now  Hypertension/PAF: -Medications: Lisinopril-HCTZ 20-12.5, Metoprolol 50, Imdur 60, Eliquis 5 BID -Patient is compliant with above medications and reports no side effects. -Checking BP at home (average): doesn't check -Denies any SOB, CP, vision changes, LE edema or symptoms of hypotension   HLD: -Medications: Lipitor 80 -Patient is compliant with above medications and reports no side effects.  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 -Referral to ENT/audiology place at [place], saw them last week  Past Medical History:  Diagnosis Date   A-fib ([place]) 06/07/2014   Arm fracture 03/10/2017   right   CAD (coronary artery disease) 06/07/2014   Closed right radial fracture 03/21/2017   Depression 09/24/2015   Dyslipidemia 08/16/2016   Dysphonia 08/16/2016   Hypertension    Mild aortic stenosis 08/22/2016   Echo July 2018   Mitral regurgitation 08/22/2016   Echo July 2018   Stroke [facility])    Vertigo    Wears hearing aid in right ear     Past Surgical History:  Procedure Laterality Date   ABDOMINAL HYSTERECTOMY     APPENDECTOMY     CHOLECYSTECTOMY     FRACTURE  SURGERY      Family History  Problem Relation Age of Onset   Stroke Mother    Heart attack Father    Heart disease Sister    Diabetes Sister    Heart disease Brother    Heart attack Brother    Diabetes Maternal Grandfather    Ovarian cancer Paternal Grandmother    Cancer Brother        bladder cancer   Diabetes Brother    Heart disease Brother    Hypertension Brother    Heart attack Daughter     Social History   Socioeconomic History   Marital status: Widowed    Spouse name: Not on file   Number of children: 3   Years of education: Not on file   Highest education level: Not on file  Occupational History   Not on file  Tobacco Use   Smoking status: Never   Smokeless tobacco: Never  Vaping Use   Vaping Use: Never used  Substance and Sexual Activity   Alcohol use: No   Drug use: No   Sexual activity: Never  Other Topics Concern   Not on file  Social History Narrative   Only has 1 living child left   Social Determinants of Health   Financial Resource Strain: Low Risk    Difficulty of Paying Living Expenses: Not very hard  Food Insecurity: No Food Insecurity   Worried About Running Out of Food in the Last Year: Never true  Ran Out of Food in the Last Year: Never true  Transportation Needs: Unmet Transportation Needs   Lack of Transportation (Medical): Yes   Lack of Transportation (Non-Medical): Yes  Physical Activity: Inactive   Days of Exercise per Week: 0 days   Minutes of Exercise per Session: 0 min  Stress: No Stress Concern Present   Feeling of Stress : Not at all  Social Connections: Moderately Isolated   Frequency of Communication with Friends and Family: More than three times a week   Frequency of Social Gatherings with Friends and Family: Three times a week   Attends Religious Services: More than 4 times per year   Active Member of Clubs or Organizations: No   Attends Archivist Meetings: Never   Marital Status: Widowed  Arboriculturist Violence: Not At Risk   Fear of Current or Ex-Partner: No   Emotionally Abused: No   Physically Abused: No   Sexually Abused: No    Outpatient Medications Prior to Visit  Medication Sig Dispense Refill   apixaban (ELIQUIS) 5 MG TABS tablet Take 1 tablet (5 mg total) by mouth 2 (two) times daily. 60 tablet 0   atorvastatin (LIPITOR) 80 MG tablet Take 1 tablet (80 mg total) by mouth at bedtime. 90 tablet 2   Cholecalciferol 25 MCG (1000 UT) tablet Take 1,000 Units by mouth daily.     famotidine (PEPCID) 20 MG tablet Take 1 tablet (20 mg total) by mouth 2 (two) times daily as needed for heartburn or indigestion. 180 tablet 3   FLUoxetine (PROZAC) 20 MG capsule Take 1 capsule (20 mg total) by mouth daily. 90 capsule 3   isosorbide mononitrate (IMDUR) 60 MG 24 hr tablet Take 1 tablet (60 mg total) by mouth daily. 90 tablet 1   Lidocaine (HM LIDOCAINE PATCH) 4 % PTCH Apply 1 patch topically daily. (Patient not taking: Reported on 03/25/2021) 30 patch 1   lisinopril-hydrochlorothiazide (ZESTORETIC) 20-12.5 MG tablet Take 1 tablet by mouth daily. 30 tablet 3   melatonin 1 MG TABS tablet Take 1 tablet (1 mg total) by mouth at bedtime. (Patient not taking: Reported on 03/25/2021) 30 tablet 3   metoprolol succinate (TOPROL-XL) 50 MG 24 hr tablet Take 1 tablet (50 mg total) by mouth daily. Take with or immediately following a meal. 90 tablet 0   Multiple Vitamin (MULTIVITAMIN) tablet Take 1 tablet by mouth daily.     No facility-administered medications prior to visit.    Allergies  Allergen Reactions   Iodine Nausea And Vomiting   Levaquin [Levofloxacin] Other (See Comments)    Pt states she have nose bleeds and pain in arms and shoulders.    ROS Review of Systems  Constitutional:  Negative for activity change, appetite change, chills and fever.  HENT:  Positive for hearing loss.   Eyes:  Negative for visual disturbance.  Respiratory:  Negative for cough and shortness of breath.    Cardiovascular:  Negative for chest pain.  Gastrointestinal:  Negative for abdominal pain.  Neurological:  Negative for dizziness and headaches.     Objective:    Physical Exam Constitutional:      Appearance: Normal appearance.  HENT:     Head: Normocephalic and atraumatic.  Eyes:     Conjunctiva/sclera: Conjunctivae normal.  Cardiovascular:     Rate and Rhythm: Normal rate and regular rhythm.  Pulmonary:     Effort: Pulmonary effort is normal.     Breath sounds: Normal breath sounds.  Musculoskeletal:  General: Normal range of motion.     Cervical back: Normal range of motion.  Skin:    General: Skin is warm and dry.  Neurological:     General: No focal deficit present.     Mental Status: She is alert. Mental status is at baseline.     Motor: Weakness present.     Coordination: Coordination abnormal.     Gait: Gait abnormal.  Psychiatric:        Mood and Affect: Mood normal.        Behavior: Behavior normal.    BP 120/72    Pulse 84    Temp 98.1 F (36.7 C)    Resp 16    Ht 5' (1.524 m)    Wt 148 lb 1.6 oz (67.2 kg)    SpO2 97%    BMI 28.92 kg/m  Wt Readings from Last 3 Encounters:  04/08/21 148 lb 1.6 oz (67.2 kg)  04/02/21 147 lb (66.7 kg)  02/22/21 145 lb 9.6 oz (66 kg)     Health Maintenance Due  Topic Date Due   Zoster Vaccines- Shingrix (1 of 2) Never done   COVID-19 Vaccine (4 - Booster for Moderna series) 01/30/2020    There are no preventive care reminders to display for this patient.  Lab Results  Component Value Date   TSH 2.98 06/23/2020   Lab Results  Component Value Date   WBC 6.5 02/14/2021   HGB 14.4 02/14/2021   HCT 44.0 02/14/2021   MCV 95.0 02/14/2021   PLT 264 02/14/2021   Lab Results  Component Value Date   NA 141 02/15/2021   K 3.7 02/15/2021   CO2 27 02/15/2021   GLUCOSE 88 02/15/2021   BUN 15 02/15/2021   CREATININE 0.70 02/15/2021   BILITOT 1.0 02/14/2021   ALKPHOS 104 02/14/2021   AST 26 02/14/2021   ALT  20 02/14/2021   PROT 7.1 02/14/2021   ALBUMIN 3.4 (L) 02/14/2021   CALCIUM 8.9 02/15/2021   ANIONGAP 8 02/15/2021   Lab Results  Component Value Date   CHOL 179 06/23/2020   Lab Results  Component Value Date   HDL 35 (L) 06/23/2020   Lab Results  Component Value Date   LDLCALC 94 06/23/2020   Lab Results  Component Value Date   TRIG 377 (H) 06/23/2020   Lab Results  Component Value Date   CHOLHDL 5.1 (H) 06/23/2020   Lab Results  Component Value Date   HGBA1C 5.7 (H) 05/08/2019      Assessment & Plan:   Problem List Items Addressed This Visit       Cardiovascular and Mediastinum   HTN (hypertension) (Chronic)   A-fib ([facility]) (Chronic)    Stable, doing well on Eliquis, beta blocker      CAD (coronary artery disease)    Stable, continue current medications.         Digestive   GERD (gastroesophageal reflux disease)    Stable, continue current medications.         Nervous and Auditory   Hearing loss    Following with ENT, seeing next week.         Other   Ambulatory dysfunction - Primary    Primary diagnosis for [facility] assistance, papers filled out and returned to patient.      Other Visit Diagnoses     Fall, subsequent encounter           No orders of the defined types were placed in  this encounter.   Follow-up: Return for already scheduled.    Teodora Medici, DO

## 2021-04-08 NOTE — Assessment & Plan Note (Signed)
Stable, doing well on Eliquis, beta blocker ?

## 2021-04-08 NOTE — Assessment & Plan Note (Signed)
Following with ENT, seeing next week.  ?

## 2021-04-09 ENCOUNTER — Other Ambulatory Visit: Payer: Self-pay | Admitting: Unknown Physician Specialty

## 2021-04-09 DIAGNOSIS — H90A22 Sensorineural hearing loss, unilateral, left ear, with restricted hearing on the contralateral side: Secondary | ICD-10-CM | POA: Diagnosis not present

## 2021-04-09 DIAGNOSIS — I4891 Unspecified atrial fibrillation: Secondary | ICD-10-CM

## 2021-04-09 DIAGNOSIS — H903 Sensorineural hearing loss, bilateral: Secondary | ICD-10-CM | POA: Diagnosis not present

## 2021-04-09 NOTE — Telephone Encounter (Signed)
Requested Prescriptions  ?Pending Prescriptions Disp Refills  ?? metoprolol succinate (TOPROL-XL) 50 MG 24 hr tablet [Pharmacy Med Name: METOPROLOL SUCC ER 50 MG TAB] 90 tablet 0  ?  Sig: Take 1 tablet (50 mg total) by mouth daily. Take with or immediately following a meal.  ?  ? Cardiovascular:  Beta Blockers Passed - 04/09/2021  1:44 PM  ?  ?  Passed - Last BP in normal range  ?  BP Readings from Last 1 Encounters:  ?04/08/21 120/72  ?   ?  ?  Passed - Last Heart Rate in normal range  ?  Pulse Readings from Last 1 Encounters:  ?04/08/21 84  ?   ?  ?  Passed - Valid encounter within last 6 months  ?  Recent Outpatient Visits   ?      ? Yesterday Ambulatory dysfunction  ? [place], DO  ? 1 month ago Hypersomnia  ? [facility] Teodora Medici, DO  ? 9 months ago Nonintractable headache, unspecified chronicity pattern, unspecified headache type  ? [place], NP  ? 9 months ago Hypersomnia  ? [facility] Kathrine Haddock, NP  ? 1 year ago Essential hypertension  ? [facility] Delsa Grana, [place]  ?  ?  ?Future Appointments   ?        ? In 4 months Teodora Medici, [facility], [place]  ? In 6 months  [place]  ?  ? ?  ?  ?  ? ?

## 2021-04-29 NOTE — Addendum Note (Signed)
Addended by: Teodora Medici on: 04/29/2021 01:14 PM ? ? Modules accepted: Orders ? ?

## 2021-05-31 ENCOUNTER — Ambulatory Visit (INDEPENDENT_AMBULATORY_CARE_PROVIDER_SITE_OTHER): Payer: Medicare HMO | Admitting: Internal Medicine

## 2021-05-31 ENCOUNTER — Encounter: Payer: Self-pay | Admitting: Internal Medicine

## 2021-05-31 VITALS — BP 124/76 | HR 95 | Temp 98.5°F | Resp 16 | Ht 60.0 in | Wt 148.7 lb

## 2021-05-31 DIAGNOSIS — E559 Vitamin D deficiency, unspecified: Secondary | ICD-10-CM | POA: Diagnosis not present

## 2021-05-31 DIAGNOSIS — R5383 Other fatigue: Secondary | ICD-10-CM

## 2021-05-31 DIAGNOSIS — G4710 Hypersomnia, unspecified: Secondary | ICD-10-CM

## 2021-05-31 NOTE — Patient Instructions (Addendum)
It was great seeing you today! ? ?Plan discussed at today's visit: ?-Blood work ordered today, results will be uploaded to [facility].  ?-Referral to sleep specialist  ?-Talk to Cardiologist about potentially cutting down on Metoprolol due to fatigue  ? ?Follow up in: already scheduled in July ? ?Take care and let us know if you have any questions or concerns prior to your next visit. ? ?Dr. Rosana Berger ? ?

## 2021-05-31 NOTE — Progress Notes (Signed)
? ?  Acute Office Visit ? ?Subjective:  ? ?  ?Patient ID: Samantha [name], female    DOB: 11-Jun-1928, 88 y.o.   MRN: [id number] ? ?Chief Complaint  ?Patient presents with  ? Insomnia  ? ? ?HPI ?Patient is in today for sleep concerns.  Here with her aide who helps provide history.  Patient states that she sleeps almost 24 hours a day.  Cannot complete simple tasks without feeling extremely exhausted and falling asleep almost instantly.  She states she is very weak and tired all the time.  She states the only time she is awake is from 11 PM to 5 AM when she cannot wait.  She has tried melatonin in the past to help sleep at night however this is not helping.  She is concerned she has some sort of sleep disorder.  She denies chest pain, shortness of breath.  Her appetite is good and weight is stable.  She has been having sleeping difficulties for many years, however it is progressively getting worse as is her level of fatigue. ? ?Review of Systems  ?Constitutional:  Negative for chills, fever and weight loss.  ?Respiratory:  Negative for shortness of breath.   ?Cardiovascular:  Negative for chest pain and palpitations.  ?Neurological:  Positive for weakness.  ?Psychiatric/Behavioral:  The patient has insomnia.   ? ? ?   ?Objective:  ?  ?BP 124/76   Pulse 95   Temp 98.5 ?F (36.9 ?C) (Oral)   Resp 16   Ht 5' (1.524 m)   Wt 148 lb 11.2 oz (67.4 kg)   SpO2 98%   BMI 29.04 kg/m?  ?BP Readings from Last 3 Encounters:  ?04/08/21 120/72  ?04/02/21 (!) 149/68  ?02/22/21 116/62  ? ?Wt Readings from Last 3 Encounters:  ?04/08/21 148 lb 1.6 oz (67.2 kg)  ?04/02/21 147 lb (66.7 kg)  ?02/22/21 145 lb 9.6 oz (66 kg)  ? ?  ? ?Physical Exam ?Constitutional:   ?   Appearance: Normal appearance.  ?HENT:  ?   Head: Normocephalic and atraumatic.  ?Eyes:  ?   Conjunctiva/sclera: Conjunctivae normal.  ?Cardiovascular:  ?   Rate and Rhythm: Normal rate and regular rhythm.  ?Pulmonary:  ?   Effort: Pulmonary effort is normal.  ?   Breath  sounds: Normal breath sounds.  ?Skin: ?   General: Skin is warm and dry.  ?Neurological:  ?   General: No focal deficit present.  ?   Mental Status: She is alert. Mental status is at baseline.  ?Psychiatric:     ?   Mood and Affect: Mood normal.     ?   Behavior: Behavior normal.  ? ? ?No results found for any visits on 05/31/21. ? ? ?   ?Assessment & Plan:  ? ?1. Hypersomnia/Other fatigue/Vitamin D deficiency: Once again discussed that feeling tired can be normal, especially at her age.  Medication list reviewed, metoprolol could be contributing to fatigue.  Recommend she discuss potentially decreasing dose of metoprolol with her cardiologist.  The patient believes she has some kind of sleep disorder.  We will obtain labs, rule out thyroid disease and vitamin deficiencies.  She will be referred to sleep medicine for further evaluation.  ? ?- TSH ?- B12 and Folate Panel ?- Vitamin B1 ?- Vitamin D (25 hydroxy) ?- Ambulatory referral to Pulmonology ? ?Return for already scheduled . ? ?Teodora Medici, DO ? ? ?

## 2021-06-05 LAB — TSH: TSH: 3.09 mIU/L (ref 0.40–4.50)

## 2021-06-05 LAB — B12 AND FOLATE PANEL
Folate: 20.1 ng/mL
Vitamin B-12: 341 pg/mL (ref 200–1100)

## 2021-06-05 LAB — VITAMIN B1: Vitamin B1 (Thiamine): 68 nmol/L — ABNORMAL HIGH (ref 8–30)

## 2021-06-05 LAB — VITAMIN D 25 HYDROXY (VIT D DEFICIENCY, FRACTURES): Vit D, 25-Hydroxy: 49 ng/mL (ref 30–100)

## 2021-06-17 ENCOUNTER — Ambulatory Visit
Admission: EM | Admit: 2021-06-17 | Discharge: 2021-06-17 | Disposition: A | Discharge status: Home / Self Care | Payer: Medicare HMO | Attending: Physician Assistant | Admitting: Physician Assistant

## 2021-06-17 ENCOUNTER — Encounter: Payer: Self-pay | Admitting: *Deleted

## 2021-06-17 ENCOUNTER — Other Ambulatory Visit: Payer: Self-pay

## 2021-06-17 ENCOUNTER — Ambulatory Visit (INDEPENDENT_AMBULATORY_CARE_PROVIDER_SITE_OTHER): Payer: Medicare HMO

## 2021-06-17 DIAGNOSIS — R0781 Pleurodynia: Secondary | ICD-10-CM

## 2021-06-17 DIAGNOSIS — R0602 Shortness of breath: Secondary | ICD-10-CM | POA: Diagnosis not present

## 2021-06-17 DIAGNOSIS — M19012 Primary osteoarthritis, left shoulder: Secondary | ICD-10-CM | POA: Diagnosis not present

## 2021-06-17 DIAGNOSIS — W19XXXA Unspecified fall, initial encounter: Secondary | ICD-10-CM | POA: Diagnosis not present

## 2021-06-17 DIAGNOSIS — S20212A Contusion of left front wall of thorax, initial encounter: Secondary | ICD-10-CM

## 2021-06-17 DIAGNOSIS — I700 Atherosclerosis of aorta: Secondary | ICD-10-CM | POA: Diagnosis not present

## 2021-06-17 MED ORDER — TRAMADOL HCL 50 MG PO TABS: 50.0000 mg | ORAL_TABLET | Freq: Three times a day (TID) | ORAL | 0 refills | Status: AC | PRN

## 2021-06-17 NOTE — Discharge Instructions (Addendum)
-  We did not see any fractures of your ribs on the x-ray but as we discussed there could always be a tiny hairline fracture that we were not able to see.  Also, bruised ribs are pretty painful to. - I sent some for pain for you but be careful taking this to make sure does not make you too drowsy.  Try to just take it at nighttime and take Tylenol during the day. - Ice the area and you may continue with Salonpas patches, lidocaine patches. - Try to make sure you are taking deep breaths regularly so that you do not develop pneumonia.  If you do have fever, cough or increased shortness of breath or increased pain need to be seen again immediately. - You should be feeling better over the next 7 to 10 days.  Follow-up with PCP if you are not improving.

## 2021-06-17 NOTE — ED Triage Notes (Addendum)
Pt reports fall on SAT and has rib pain . Pt reports pain worse when breathing. Pt also reports Lt knee pain from fall.

## 2021-06-17 NOTE — ED Provider Notes (Signed)
[facility]    CSN: [id number] Arrival date & time: 06/17/21  1401      History   Chief Complaint Chief Complaint  Patient presents with   Fall    HPI Samantha [name] is a 88 y.o. female presenting for left rib, chest and breast pain for the past 5 days.  Patient says that she was pruning her bushes and accidentally fell forward and hit the left side of her body on the ground.  She says 3 days after the fall she started to experience worsening pain in the left side of her ribs.  Reports increased pain when she talks, coughs or takes of breath.  Also feels that she is short of breath at times but realizes she is guarding her breathing since it hurts to breathe deeply.  No change or worsening in symptoms over the past 2 to 3 days.  Pain does not radiate to her back or her upper extremity, neck or jaw.  No numbness, weakness or tingling reported.  No palpitations.  Patient denies head injury or loss of consciousness.  She has taken Tylenol for the pain without any improvement.  Patient reports that she has broken ribs in the past and she thinks she could have broken rib with her most recent fall.  Medical history is significant for atrial fibrillation, coronary artery disease, hyperlipidemia, hypertension, aortic stenosis, mitral regurgitation, stroke, osteoporosis.  Patient is taking Eliquis.  No other complaints.  HPI  Past Medical History:  Diagnosis Date   A-fib ([place]) 06/07/2014   Arm fracture 03/10/2017   right   CAD (coronary artery disease) 06/07/2014   Closed right radial fracture 03/21/2017   Depression 09/24/2015   Dyslipidemia 08/16/2016   Dysphonia 08/16/2016   Hypertension    Mild aortic stenosis 08/22/2016   Echo July 2018   Mitral regurgitation 08/22/2016   Echo July 2018   Stroke [facility])    Vertigo    Wears hearing aid in right ear     Patient Active Problem List   Diagnosis Date Noted   Ambulatory dysfunction 04/08/2021   FTT (failure to thrive) in  adult 02/14/2021   Elevated sed rate 07/07/2020   Dysarthria 05/08/2019   Prediabetes 05/08/2019   History of placement of stent in LAD coronary artery 05/08/2019   S/P right coronary artery (RCA) stent placement 05/08/2019   Moderate episode of recurrent major depressive disorder ([place]) 11/19/2018   History of ischemic stroke 11/19/2018   Osteoporosis 03/21/2017   Mild aortic stenosis 08/22/2016   Mitral regurgitation 08/22/2016   Dysphonia 08/16/2016   Dyslipidemia 08/16/2016   GERD (gastroesophageal reflux disease) 11/24/2015   Fatigue 09/22/2015   SOB (shortness of breath) on exertion 10/02/2014   HTN (hypertension) 06/07/2014   Cerebral infarction ([place]) 06/07/2014   A-fib ([place]) 06/07/2014   CAD (coronary artery disease) 06/07/2014   Hearing loss 10/21/2011   Vitamin B 12 deficiency 10/21/2011   Insomnia 10/21/2011    Past Surgical History:  Procedure Laterality Date   ABDOMINAL HYSTERECTOMY     APPENDECTOMY     CHOLECYSTECTOMY     FRACTURE SURGERY      OB History   No obstetric history on file.      Home Medications    Prior to Admission medications   Medication Sig Start Date End Date Taking? Authorizing Provider  traMADol (ULTRAM) 50 MG tablet Take 1 tablet (50 mg total) by mouth 3 (three) times daily as needed for up to 5 days for  severe pain. 06/17/21 06/22/21 Yes Danton Clap, PA-C  apixaban (ELIQUIS) 5 MG TABS tablet Take 1 tablet (5 mg total) by mouth 2 (two) times daily. 06/08/14   Bettey Costa, MD  atorvastatin (LIPITOR) 80 MG tablet Take 1 tablet (80 mg total) by mouth at bedtime. 10/12/20   Kathrine Haddock, NP  Cholecalciferol 25 MCG (1000 UT) tablet Take 1,000 Units by mouth daily.    [provider]  FLUoxetine (PROZAC) 20 MG capsule Take 1 capsule (20 mg total) by mouth daily. 06/23/20   Kathrine Haddock, NP  isosorbide mononitrate (IMDUR) 60 MG 24 hr tablet Take 1 tablet (60 mg total) by mouth daily. 03/20/21   Delsa Grana, PA-C   lisinopril-hydrochlorothiazide (ZESTORETIC) 20-12.5 MG tablet Take 1 tablet by mouth daily. 03/26/21   Teodora Medici, DO  metoprolol succinate (TOPROL-XL) 50 MG 24 hr tablet Take 1 tablet (50 mg total) by mouth daily. Take with or immediately following a meal. 04/09/21   Teodora Medici, DO  Multiple Vitamin (MULTIVITAMIN) tablet Take 1 tablet by mouth daily.    [provider]    Family History Family History  Problem Relation Age of Onset   Stroke Mother    Heart attack Father    Heart disease Sister    Diabetes Sister    Heart disease Brother    Heart attack Brother    Diabetes Maternal Grandfather    Ovarian cancer Paternal Grandmother    Cancer Brother        bladder cancer   Diabetes Brother    Heart disease Brother    Hypertension Brother    Heart attack Daughter     Social History Social History   Tobacco Use   Smoking status: Never   Smokeless tobacco: Never  Vaping Use   Vaping Use: Never used  Substance Use Topics   Alcohol use: No   Drug use: No     Allergies   Iodine and Levaquin [levofloxacin]   Review of Systems Review of Systems  Constitutional:  Negative for fatigue.  Respiratory:  Positive for shortness of breath. Negative for wheezing.   Cardiovascular:  Positive for chest pain.  Gastrointestinal:  Negative for nausea and vomiting.  Musculoskeletal:  Positive for arthralgias (left rib pain). Negative for back pain and gait problem.  Skin:  Negative for color change and wound.  Neurological:  Negative for dizziness, syncope, weakness and headaches.  Hematological:  Bruises/bleeds easily (on Eliquis).    Physical Exam Triage Vital Signs ED Triage Vitals  Enc Vitals Group     BP 06/17/21 1409 (!) 103/58     Pulse Rate 06/17/21 1409 81     Resp 06/17/21 1409 20     Temp 06/17/21 1409 97.9 F (36.6 C)     Temp src --      SpO2 06/17/21 1409 99 %     Weight --      Height --      Head Circumference --      Peak Flow --       Pain Score 06/17/21 1412 8     Pain Loc --      Pain Edu? --      Excl. in [place]? --    No data found.  Updated Vital Signs BP (!) 103/58   Pulse 81   Temp 97.9 F (36.6 C)   Resp 20   SpO2 99%      Physical Exam Vitals and nursing note reviewed.  Constitutional:  General: She is not in acute distress.    Appearance: Normal appearance. She is not ill-appearing or toxic-appearing.  HENT:     Head: Normocephalic and atraumatic.     Nose: Nose normal.     Mouth/Throat:     Mouth: Mucous membranes are moist.     Pharynx: Oropharynx is clear.  Eyes:     General: No scleral icterus.       Right eye: No discharge.        Left eye: No discharge.     Extraocular Movements: Extraocular movements intact.     Conjunctiva/sclera: Conjunctivae normal.     Pupils: Pupils are equal, round, and reactive to light.  Cardiovascular:     Rate and Rhythm: Normal rate and regular rhythm.     Heart sounds: Normal heart sounds.  Pulmonary:     Effort: Pulmonary effort is normal. No respiratory distress.     Breath sounds: Normal breath sounds.  Chest:     Chest wall: Tenderness (TTP left pectoralis and left lateral ribs 6-8. She actually yelps in pain when I palpate her ribs. No contusions, step offs, swelling) present.  Musculoskeletal:     Cervical back: Neck supple.  Skin:    General: Skin is dry.  Neurological:     General: No focal deficit present.     Mental Status: She is alert and oriented to person, place, and time. Mental status is at baseline.     Cranial Nerves: No cranial nerve deficit.     Motor: No weakness.     Coordination: Coordination normal.     Gait: Gait normal.  Psychiatric:        Mood and Affect: Mood normal.        Behavior: Behavior normal.        Thought Content: Thought content normal.     UC Treatments / Results  Labs (all labs ordered are listed, but only abnormal results are displayed) Labs Reviewed - No data to  display  EKG   Radiology DG Ribs Unilateral W/Chest Left  Result Date: 06/17/2021 CLINICAL DATA:  [place] 4 days ago. Left rib and chest pain. Shortness of breath. EXAM: LEFT RIBS AND CHEST - 3+ VIEW COMPARISON:  None Available. FINDINGS: No fracture or other bone lesions are seen involving the ribs. Osteopenia however limits evaluation. Left glenohumeral osteoarthritis. There is no evidence of pneumothorax or pleural effusion. Both lungs are clear. Heart size and mediastinal contours are within normal limits. Atherosclerotic calcification of the aortic arch. IMPRESSION: 1. No displaced rib fracture, evaluation is however limited due to diffuse osteopenia. 2. Lungs are clear. No appreciable pleural effusion or pneumothorax. Electronically Signed   By: Keane Police D.O.   On: 06/17/2021 14:53    Procedures Procedures (including critical care time)  Medications Ordered in UC Medications - No data to display  Initial Impression / Assessment and Plan / UC Course  I have reviewed the triage vital signs and the nursing notes.  Pertinent labs & imaging results that were available during my care of the patient were reviewed by me and considered in my medical decision making (see chart for details).  88 year old female presenting with her sister for left rib pain, left breast and chest pain x5 days after an axonal fall while she was pruning her plants.  Reports increased pain starting 3 days after the injury.  No recent worsening over the past 2 days.  Reports increased pain with breathing, talking, moving.  Reports difficulty  sleeping at night due to the pain.  Taking Tylenol.  Vitals are stable.  She is overall well-appearing and in no acute distress but she clearly has pain and discomfort when she speaks.  She is wincing.  Tenderness to palpation of her left breast, left pectoralis and left lateral ribs 6 through 8.  No overlying contusions, swelling, lacerations or step-offs noted.  Chest is clear to  auscultation and heart regular rate and rhythm.  X-ray of left ribs and chest obtained today shows no obvious fractures.  Discussed results with patient.  Advised that she could still have a tiny hairline fracture or very badly bruised ribs that are causing her pain.  Reviewed RICE guidelines.  Advised to continue Tylenol for pain.  Did send short supply of Ultram after reviewing controlled [place] database and finding her to be low risk for abuse.  Advised to be careful with this medicine.  Patient given compressive bandage as well.  Reminded to make sure to breathe deeply to prevent pneumonia.  Reviewed return and ER precautions.   Final Clinical Impressions(s) / UC Diagnoses   Final diagnoses:  Rib pain on left side  Contusion of rib on left side, initial encounter  Fall, initial encounter     Discharge Instructions      -We did not see any fractures of your ribs on the x-ray but as we discussed there could always be a tiny hairline fracture that we were not able to see.  Also, bruised ribs are pretty painful to. - I sent some for pain for you but be careful taking this to make sure does not make you too drowsy.  Try to just take it at nighttime and take Tylenol during the day. - Ice the area and you may continue with Salonpas patches, lidocaine patches. - Try to make sure you are taking deep breaths regularly so that you do not develop pneumonia.  If you do have fever, cough or increased shortness of breath or increased pain need to be seen again immediately. - You should be feeling better over the next 7 to 10 days.  Follow-up with PCP if you are not improving.     ED Prescriptions     Medication Sig Dispense Auth. Provider   traMADol (ULTRAM) 50 MG tablet Take 1 tablet (50 mg total) by mouth 3 (three) times daily as needed for up to 5 days for severe pain. 15 tablet Danton Clap, PA-C      I have reviewed the PDMP during this encounter.   Danton Clap, PA-C 06/17/21  1516

## 2021-06-18 ENCOUNTER — Other Ambulatory Visit: Payer: Self-pay | Admitting: Unknown Physician Specialty

## 2021-06-18 NOTE — Telephone Encounter (Signed)
Requested Prescriptions  Pending Prescriptions Disp Refills  . FLUoxetine (PROZAC) 20 MG capsule [Pharmacy Med Name: FLUOXETINE HCL 20 MG CAPSULE] 90 capsule 1    Sig: Take 1 capsule (20 mg total) by mouth daily.     Psychiatry:  Antidepressants - SSRI Passed - 06/18/2021 11:10 AM      Passed - Completed PHQ-2 or PHQ-9 in the last 360 days      Passed - Valid encounter within last 6 months    Recent Outpatient Visits          2 weeks ago Other fatigue   [place], DO   2 months ago Ambulatory dysfunction   [place], DO   3 months ago Hypersomnia   [facility] Teodora Medici, DO   11 months ago Nonintractable headache, unspecified chronicity pattern, unspecified headache type   [place], NP   12 months ago Hypersomnia   [facility] Kathrine Haddock, NP      Future Appointments            In 2 months Teodora Medici, [facility], [place]   In 4 months  [place]

## 2021-07-05 ENCOUNTER — Other Ambulatory Visit: Payer: Self-pay | Admitting: Internal Medicine

## 2021-07-05 DIAGNOSIS — I4891 Unspecified atrial fibrillation: Secondary | ICD-10-CM

## 2021-07-06 NOTE — Telephone Encounter (Signed)
Requested Prescriptions  Pending Prescriptions Disp Refills  . metoprolol succinate (TOPROL-XL) 50 MG 24 hr tablet [Pharmacy Med Name: METOPROLOL SUCC ER 50 MG TAB] 90 tablet 0    Sig: Take 1 tablet (50 mg total) by mouth daily. Take with or immediately following a meal.     Cardiovascular:  Beta Blockers Passed - 07/05/2021  1:16 PM      Passed - Last BP in normal range    BP Readings from Last 1 Encounters:  06/17/21 (!) 103/58         Passed - Last Heart Rate in normal range    Pulse Readings from Last 1 Encounters:  06/17/21 81         Passed - Valid encounter within last 6 months    Recent Outpatient Visits          1 month ago Other fatigue   [place], DO   2 months ago Ambulatory dysfunction   [place], DO   4 months ago Hypersomnia   [place], DO   12 months ago Nonintractable headache, unspecified chronicity pattern, unspecified headache type   [place], NP   1 year ago Hypersomnia   [place], NP      Future Appointments            In 1 month Teodora Medici, [facility], [place]   In 3 months  [place]

## 2021-07-15 ENCOUNTER — Other Ambulatory Visit: Payer: Self-pay | Admitting: Unknown Physician Specialty

## 2021-07-15 DIAGNOSIS — E785 Hyperlipidemia, unspecified: Secondary | ICD-10-CM

## 2021-07-15 NOTE — Telephone Encounter (Signed)
Requested Prescriptions  Pending Prescriptions Disp Refills  . atorvastatin (LIPITOR) 80 MG tablet [Pharmacy Med Name: ATORVASTATIN 80 MG TABLET] 90 tablet 0    Sig: Take 1 tablet (80 mg total) by mouth at bedtime.     Cardiovascular:  Antilipid - Statins Failed - 07/15/2021  7:55 AM      Failed - Lipid Panel in normal range within the last 12 months    Cholesterol  Date Value Ref Range Status  06/23/2020 179 <200 mg/dL Final   LDL Cholesterol (Calc)  Date Value Ref Range Status  06/23/2020 94 mg/dL (calc) Final    Comment:    Reference range: <100 . Desirable range <100 mg/dL for primary prevention;   <70 mg/dL for patients with CHD or diabetic patients  with > or = 2 CHD risk factors. Marland Kitchen LDL-C is now calculated using the Martin-Hopkins  calculation, which is a validated novel method providing  better accuracy than the Friedewald equation in the  estimation of LDL-C.  Cresenciano Genre et al. Annamaria Helling. 5974;163(84): 2061-2068  (http://education.[facility].com/faq/FAQ164)    HDL  Date Value Ref Range Status  06/23/2020 35 (L) > OR = 50 mg/dL Final   Triglycerides  Date Value Ref Range Status  06/23/2020 377 (H) <150 mg/dL Final    Comment:    . If a non-fasting specimen was collected, consider repeat triglyceride testing on a fasting specimen if clinically indicated.  Yates Decamp et al. J. of Clin. Lipidol. 5364;6:803-212. Marland Kitchen          Passed - Patient is not pregnant      Passed - Valid encounter within last 12 months    Recent Outpatient Visits          1 month ago Other fatigue   [facility] Teodora Medici, DO   3 months ago Ambulatory dysfunction   [place], DO   4 months ago Hypersomnia   [place], DO   1 year ago Nonintractable headache, unspecified chronicity pattern, unspecified headache type   [place], NP   1 year  ago Hypersomnia   [place], NP      Future Appointments            In 1 month Teodora Medici, [facility], [place]   In 3 months  [place]

## 2021-07-30 ENCOUNTER — Institutional Professional Consult (permissible substitution): Payer: Medicare HMO | Admitting: Primary Care

## 2021-08-07 ENCOUNTER — Other Ambulatory Visit: Payer: Self-pay | Admitting: Internal Medicine

## 2021-08-07 DIAGNOSIS — I10 Essential (primary) hypertension: Secondary | ICD-10-CM

## 2021-08-09 NOTE — Telephone Encounter (Signed)
Requested Prescriptions  Pending Prescriptions Disp Refills  . lisinopril-hydrochlorothiazide (ZESTORETIC) 20-12.5 MG tablet [Pharmacy Med Name: LISINOPRIL-HCTZ 20-12.5 MG TAB] 30 tablet 0    Sig: Take 1 tablet by mouth daily.     Cardiovascular:  ACEI + Diuretic Combos Passed - 08/07/2021  7:18 PM      Passed - Na in normal range and within 180 days    Sodium  Date Value Ref Range Status  02/15/2021 141 135 - 145 mmol/L Final  04/04/2013 138 136 - 145 mmol/L Final         Passed - K in normal range and within 180 days    Potassium  Date Value Ref Range Status  02/15/2021 3.7 3.5 - 5.1 mmol/L Final  04/04/2013 4.3 3.5 - 5.1 mmol/L Final         Passed - Cr in normal range and within 180 days    Creat  Date Value Ref Range Status  06/23/2020 0.91 (H) 0.60 - 0.88 mg/dL Final    Comment:    For patients >49 years of age, the reference limit for Creatinine is approximately 13% higher for people identified as African-American. .    Creatinine, Ser  Date Value Ref Range Status  02/15/2021 0.70 0.44 - 1.00 mg/dL Final         Passed - eGFR is 30 or above and within 180 days    GFR, Est African American  Date Value Ref Range Status  05/08/2019 71 > OR = 60 mL/min/1.73m2 Final   GFR, Est Non African American  Date Value Ref Range Status  05/08/2019 61 > OR = 60 mL/min/1.73m2 Final   GFR, Estimated  Date Value Ref Range Status  02/15/2021 >60 >60 mL/min Final    Comment:    (NOTE) Calculated using the CKD-EPI Creatinine Equation (2021)          Passed - Patient is not pregnant      Passed - Last BP in normal range    BP Readings from Last 1 Encounters:  06/17/21 (!) 103/58         Passed - Valid encounter within last 6 months    Recent Outpatient Visits          2 months ago Other fatigue   [facility] [place], Elisabeth, DO   4 months ago Ambulatory dysfunction   [facility] [place], Elisabeth, DO   5 months ago  Hypersomnia   [facility] [place], Elisabeth, DO   1 year ago Nonintractable headache, unspecified chronicity pattern, unspecified headache type   [facility] [place], Cheryl, NP   1 year ago Hypersomnia   [facility] W[place], Cheryl, NP      Future Appointments            In 3 weeks [place], Elisabeth, DO [facility]   In 2 months  [facility]             

## 2021-08-23 ENCOUNTER — Ambulatory Visit: Payer: Medicare HMO | Admitting: Internal Medicine

## 2021-08-31 ENCOUNTER — Ambulatory Visit: Payer: Medicare HMO | Admitting: Internal Medicine

## 2021-08-31 NOTE — Progress Notes (Deleted)
Established Patient Office Visit  Subjective:  Patient ID: Samantha [name], female    DOB: July 22, 1928  Age: 88 y.o. MRN: [id number]  CC:  No chief complaint on file.   HPI Samantha [name] presents for follow up on chronic medical conditions.  Hypertension/PAF: -Medications: Lisinopril-HCTZ 20-12.5, Metoprolol 50, Imdur 60, Eliquis 5 BID -Patient is compliant with above medications and reports no side effects. -Checking BP at home (average): doesn't check -Denies any SOB, CP, vision changes, LE edema or symptoms of hypotension  HLD: -Medications: Lipitor 80 -Patient is compliant with above medications and reports no side effects.  -Last lipid panel: Lipid Panel     Component Value Date/Time   CHOL 179 06/23/2020 1357   TRIG 377 (H) 06/23/2020 1357   HDL 35 (L) 06/23/2020 1357   CHOLHDL 5.1 (H) 06/23/2020 1357   VLDL 61 (H) 08/16/2016 1112   LDLCALC 94 06/23/2020 1357   GERD: -Currently on Pepcid 20 mg ?  MDD/Hypersomnolence: -Currently on Prozac 20 mg, which had been decreased due to hypersomnolence in the past  -Labs obtained at last visit normal to rule out potential sleep issue      04/08/2021    2:25 PM 02/22/2021   11:27 AM 10/22/2020    9:30 AM 07/07/2020    2:06 PM 06/23/2020    1:19 PM  Depression screen PHQ 2/9  Decreased Interest 3 0 0 0 1  Down, Depressed, Hopeless 3 1 0 0 1  PHQ - 2 Score 6 1 0 0 2  Altered sleeping  1 2 0 1  Tired, decreased energy '3 1 2 '$ 0 1  Change in appetite 2 1 0 0 1  Feeling bad or failure about yourself  0 0 0 0 1  Trouble concentrating 0 0 0 0 1  Moving slowly or fidgety/restless 0 0 0 0 1  Suicidal thoughts 0 0 0 0 0  PHQ-9 Score  4 4 0 8  Difficult doing work/chores Somewhat difficult Not difficult at all Not difficult at all Not difficult at all Somewhat difficult    Hearing Loss: -Wears hearing aids, 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, would like referral to ENT for complete  exam   Past Medical History:  Diagnosis Date   A-fib ([place]) 06/07/2014   Arm fracture 03/10/2017   right   CAD (coronary artery disease) 06/07/2014   Closed right radial fracture 03/21/2017   Depression 09/24/2015   Dyslipidemia 08/16/2016   Dysphonia 08/16/2016   Hypertension    Mild aortic stenosis 08/22/2016   Echo July 2018   Mitral regurgitation 08/22/2016   Echo July 2018   Stroke [facility])    Vertigo    Wears hearing aid in right ear     Past Surgical History:  Procedure Laterality Date   ABDOMINAL HYSTERECTOMY     APPENDECTOMY     CHOLECYSTECTOMY     FRACTURE SURGERY      Family History  Problem Relation Age of Onset   Stroke Mother    Heart attack Father    Heart disease Sister    Diabetes Sister    Heart disease Brother    Heart attack Brother    Diabetes Maternal Grandfather    Ovarian cancer Paternal Grandmother    Cancer Brother        bladder cancer   Diabetes Brother    Heart disease Brother    Hypertension Brother    Heart attack Daughter  Social History   Socioeconomic History   Marital status: Widowed    Spouse name: Not on file   Number of children: 3   Years of education: Not on file   Highest education level: Not on file  Occupational History   Not on file  Tobacco Use   Smoking status: Never   Smokeless tobacco: Never  Vaping Use   Vaping Use: Never used  Substance and Sexual Activity   Alcohol use: No   Drug use: No   Sexual activity: Never  Other Topics Concern   Not on file  Social History Narrative   Only has 1 living child left   Social Determinants of Health   Financial Resource Strain: Low Risk  (10/22/2020)   Overall Financial Resource Strain (CARDIA)    Difficulty of Paying Living Expenses: Not very hard  Food Insecurity: No Food Insecurity (10/22/2020)   Hunger Vital Sign    Worried About Running Out of Food in the Last Year: Never true    Ran Out of Food in the Last Year: Never true  Transportation Needs:  Unmet Transportation Needs (10/22/2020)   PRAPARE - Transportation    Lack of Transportation (Medical): Yes    Lack of Transportation (Non-Medical): Yes  Physical Activity: Inactive (10/22/2020)   Exercise Vital Sign    Days of Exercise per Week: 0 days    Minutes of Exercise per Session: 0 min  Stress: No Stress Concern Present (10/22/2020)   [place]    Feeling of Stress : Not at all  Social Connections: Moderately Isolated (10/22/2020)   Social Connection and Isolation Panel [NHANES]    Frequency of Communication with Friends and Family: More than three times a week    Frequency of Social Gatherings with Friends and Family: Three times a week    Attends Religious Services: More than 4 times per year    Active Member of Clubs or Organizations: No    Attends Archivist Meetings: Never    Marital Status: Widowed  Intimate Partner Violence: Not At Risk (10/22/2020)   Humiliation, Afraid, Rape, and Kick questionnaire    Fear of Current or Ex-Partner: No    Emotionally Abused: No    Physically Abused: No    Sexually Abused: No    Outpatient Medications Prior to Visit  Medication Sig Dispense Refill   apixaban (ELIQUIS) 5 MG TABS tablet Take 1 tablet (5 mg total) by mouth 2 (two) times daily. 60 tablet 0   atorvastatin (LIPITOR) 80 MG tablet Take 1 tablet (80 mg total) by mouth at bedtime. 90 tablet 0   Cholecalciferol 25 MCG (1000 UT) tablet Take 1,000 Units by mouth daily.     FLUoxetine (PROZAC) 20 MG capsule Take 1 capsule (20 mg total) by mouth daily. 90 capsule 1   isosorbide mononitrate (IMDUR) 60 MG 24 hr tablet Take 1 tablet (60 mg total) by mouth daily. 90 tablet 1   lisinopril-hydrochlorothiazide (ZESTORETIC) 20-12.5 MG tablet Take 1 tablet by mouth daily. 30 tablet 0   metoprolol succinate (TOPROL-XL) 50 MG 24 hr tablet Take 1 tablet (50 mg total) by mouth daily. Take with or immediately following a  meal. 90 tablet 0   Multiple Vitamin (MULTIVITAMIN) tablet Take 1 tablet by mouth daily.     No facility-administered medications prior to visit.    Allergies  Allergen Reactions   Iodine Nausea And Vomiting   Levaquin [Levofloxacin] Other (See Comments)  Pt states she have nose bleeds and pain in arms and shoulders.    ROS Review of Systems  Constitutional:  Negative for chills and fever.  HENT:  Positive for hearing loss. Negative for ear discharge and ear pain.   Eyes:  Negative for visual disturbance.  Respiratory:  Negative for cough.   Cardiovascular:  Negative for chest pain and palpitations.  Gastrointestinal:  Negative for abdominal pain.  Neurological:  Negative for dizziness and headaches.  Psychiatric/Behavioral:  Positive for sleep disturbance.       Objective:    Physical Exam Constitutional:      Appearance: Normal appearance.  HENT:     Head: Normocephalic and atraumatic.  Eyes:     Conjunctiva/sclera: Conjunctivae normal.  Cardiovascular:     Rate and Rhythm: Normal rate and regular rhythm.  Pulmonary:     Effort: Pulmonary effort is normal.     Breath sounds: Normal breath sounds.  Musculoskeletal:     Right lower leg: No edema.     Left lower leg: No edema.  Skin:    General: Skin is warm and dry.  Neurological:     General: No focal deficit present.     Mental Status: She is alert. Mental status is at baseline.  Psychiatric:        Mood and Affect: Mood normal.        Behavior: Behavior normal.     There were no vitals taken for this visit. Wt Readings from Last 3 Encounters:  05/31/21 148 lb 11.2 oz (67.4 kg)  04/08/21 148 lb 1.6 oz (67.2 kg)  04/02/21 147 lb (66.7 kg)     Health Maintenance Due  Topic Date Due   Zoster Vaccines- Shingrix (1 of 2) Never done   COVID-19 Vaccine (4 - Moderna series) 01/30/2020   INFLUENZA VACCINE  08/31/2021    There are no preventive care reminders to display for this patient.  Lab Results   Component Value Date   TSH 3.09 05/31/2021   Lab Results  Component Value Date   WBC 6.5 02/14/2021   HGB 14.4 02/14/2021   HCT 44.0 02/14/2021   MCV 95.0 02/14/2021   PLT 264 02/14/2021   Lab Results  Component Value Date   NA 141 02/15/2021   K 3.7 02/15/2021   CO2 27 02/15/2021   GLUCOSE 88 02/15/2021   BUN 15 02/15/2021   CREATININE 0.70 02/15/2021   BILITOT 1.0 02/14/2021   ALKPHOS 104 02/14/2021   AST 26 02/14/2021   ALT 20 02/14/2021   PROT 7.1 02/14/2021   ALBUMIN 3.4 (L) 02/14/2021   CALCIUM 8.9 02/15/2021   ANIONGAP 8 02/15/2021   Lab Results  Component Value Date   CHOL 179 06/23/2020   Lab Results  Component Value Date   HDL 35 (L) 06/23/2020   Lab Results  Component Value Date   LDLCALC 94 06/23/2020   Lab Results  Component Value Date   TRIG 377 (H) 06/23/2020   Lab Results  Component Value Date   CHOLHDL 5.1 (H) 06/23/2020   Lab Results  Component Value Date   HGBA1C 5.7 (H) 05/08/2019      Assessment & Plan:   1. Closed fracture of one rib of right side with routine healing, subsequent encounter: Doing better, pain controlled with occasional Tylenol, will prescribe Lidocaine patch as well.   - Lidocaine (HM LIDOCAINE PATCH) 4 % PTCH; Apply 1 patch topically daily.  Dispense: 30 patch; Refill: 1  2. Hypersomnia: Discussed  at length, she is not sleeping at night because she is sleeping all day. Normal at her age to be tired after activity but recommend trying not to nap during the day and taking a low dose Melatonin at night. Discussed sleep hygiene practices.   - melatonin 1 MG TABS tablet; Take 1 tablet (1 mg total) by mouth at bedtime.  Dispense: 30 tablet; Refill: 3  3. Bilateral hearing loss, unspecified hearing loss type: Patient requesting referral to ENT.   - Ambulatory referral to ENT  4. Primary hypertension/Atrial fibrillation, unspecified type ([place]): Stable, continue medications.   5. Dyslipidemia/Athscl heart disease  of native cor art w oth ang pctrs ([place]): Stable, continue statin.  6. Gastroesophageal reflux disease with esophagitis, unspecified whether hemorrhage: Stable, continue   7. Recurrent major depressive disorder, remission status unspecified ([place]): Stable, doing well on lower dose of Prozac but hasn't noticed any change in sleep.   Follow-up: No follow-ups on file.    Teodora Medici, DO

## 2021-09-12 ENCOUNTER — Other Ambulatory Visit: Payer: Self-pay | Admitting: Internal Medicine

## 2021-09-12 DIAGNOSIS — I10 Essential (primary) hypertension: Secondary | ICD-10-CM

## 2021-09-13 DIAGNOSIS — R0602 Shortness of breath: Secondary | ICD-10-CM | POA: Diagnosis not present

## 2021-09-13 DIAGNOSIS — R400 Somnolence: Secondary | ICD-10-CM | POA: Diagnosis not present

## 2021-09-13 DIAGNOSIS — E7800 Pure hypercholesterolemia, unspecified: Secondary | ICD-10-CM | POA: Diagnosis not present

## 2021-09-13 DIAGNOSIS — I480 Paroxysmal atrial fibrillation: Secondary | ICD-10-CM | POA: Diagnosis not present

## 2021-09-13 DIAGNOSIS — I10 Essential (primary) hypertension: Secondary | ICD-10-CM | POA: Diagnosis not present

## 2021-09-13 DIAGNOSIS — R69 Illness, unspecified: Secondary | ICD-10-CM | POA: Diagnosis not present

## 2021-09-13 NOTE — Telephone Encounter (Signed)
Requested medication (s) are due for refill today: yes  Requested medication (s) are on the active medication list: yes  Last refill:  08/09/21 #30/0  Future visit scheduled: yes  Notes to clinic:  Unable to refill per protocol due to failed labs, no updated results.      Requested Prescriptions  Pending Prescriptions Disp Refills   lisinopril-hydrochlorothiazide (ZESTORETIC) 20-12.5 MG tablet [Pharmacy Med Name: LISINOPRIL-HCTZ 20-12.5 MG TAB] 30 tablet 0    Sig: Take 1 tablet by mouth daily.     Cardiovascular:  ACEI + Diuretic Combos Failed - 09/12/2021 10:01 AM      Failed - Na in normal range and within 180 days    Sodium  Date Value Ref Range Status  02/15/2021 141 135 - 145 mmol/L Final  04/04/2013 138 136 - 145 mmol/L Final         Failed - K in normal range and within 180 days    Potassium  Date Value Ref Range Status  02/15/2021 3.7 3.5 - 5.1 mmol/L Final  04/04/2013 4.3 3.5 - 5.1 mmol/L Final         Failed - Cr in normal range and within 180 days    Creat  Date Value Ref Range Status  06/23/2020 0.91 (H) 0.60 - 0.88 mg/dL Final    Comment:    For patients >68 years of age, the reference limit for Creatinine is approximately 13% higher for people identified as African-American. .    Creatinine, Ser  Date Value Ref Range Status  02/15/2021 0.70 0.44 - 1.00 mg/dL Final         Failed - eGFR is 30 or above and within 180 days    GFR, Est African American  Date Value Ref Range Status  05/08/2019 71 > OR = 60 mL/min/1.62m Final   GFR, Est Non African American  Date Value Ref Range Status  05/08/2019 61 > OR = 60 mL/min/1.783mFinal   GFR, Estimated  Date Value Ref Range Status  02/15/2021 >60 >60 mL/min Final    Comment:    (NOTE) Calculated using the CKD-EPI Creatinine Equation (2021)          Passed - Patient is not pregnant      Passed - Last BP in normal range    BP Readings from Last 1 Encounters:  06/17/21 (!) 103/58         Passed  - Valid encounter within last 6 months    Recent Outpatient Visits           3 months ago Other fatigue   [facility]   5 months ago Ambulatory dysfunction   [facility]DO   6 months ago Hypersomnia   [place]   1 year ago Nonintractable headache, unspecified chronicity pattern, unspecified headache type   CHCarsonNP   1 year ago Hypersomnia   [facility]NP       Future Appointments             [facility]

## 2021-09-17 ENCOUNTER — Other Ambulatory Visit: Payer: Self-pay | Admitting: Family Medicine

## 2021-09-17 DIAGNOSIS — I25111 Atherosclerotic heart disease of native coronary artery with angina pectoris with documented spasm: Secondary | ICD-10-CM

## 2021-09-17 DIAGNOSIS — I4891 Unspecified atrial fibrillation: Secondary | ICD-10-CM

## 2021-10-07 DIAGNOSIS — H7012 Chronic mastoiditis, left ear: Secondary | ICD-10-CM | POA: Diagnosis not present

## 2021-10-11 ENCOUNTER — Other Ambulatory Visit: Payer: Self-pay | Admitting: Internal Medicine

## 2021-10-11 ENCOUNTER — Other Ambulatory Visit: Payer: Self-pay | Admitting: Unknown Physician Specialty

## 2021-10-11 DIAGNOSIS — E785 Hyperlipidemia, unspecified: Secondary | ICD-10-CM

## 2021-10-11 DIAGNOSIS — I10 Essential (primary) hypertension: Secondary | ICD-10-CM

## 2021-10-12 NOTE — Telephone Encounter (Signed)
Requested medication (s) are due for refill today: yes  Requested medication (s) are on the active medication list: yes  Last refill:  07/15/21 #90/0  Future visit scheduled: no  Notes to clinic:  Unable to refill per protocol due to failed labs, no updated results.      Requested Prescriptions  Pending Prescriptions Disp Refills   atorvastatin (LIPITOR) 80 MG tablet [Pharmacy Med Name: ATORVASTATIN 80 MG TABLET] 90 tablet 0    Sig: Take 1 tablet (80 mg total) by mouth at bedtime.     Cardiovascular:  Antilipid - Statins Failed - 10/11/2021 11:47 AM      Failed - Lipid Panel in normal range within the last 12 months    Cholesterol  Date Value Ref Range Status  06/23/2020 179 <200 mg/dL Final   LDL Cholesterol (Calc)  Date Value Ref Range Status  06/23/2020 94 mg/dL (calc) Final    Comment:    Reference range: <100 . Desirable range <100 mg/dL for primary prevention;   <70 mg/dL for patients with CHD or diabetic patients  with > or = 2 CHD risk factors. Marland Kitchen LDL-C is now calculated using the Martin-Hopkins  calculation, which is a validated novel method providing  better accuracy than the Friedewald equation in the  estimation of LDL-C.  Cresenciano Genre et al. Annamaria Helling. 9935;701(77): 2061-2068  (http://education.[facility].com/faq/FAQ164)    HDL  Date Value Ref Range Status  06/23/2020 35 (L) > OR = 50 mg/dL Final   Triglycerides  Date Value Ref Range Status  06/23/2020 377 (H) <150 mg/dL Final    Comment:    . If a non-fasting specimen was collected, consider repeat triglyceride testing on a fasting specimen if clinically indicated.  Yates Decamp et al. J. of Clin. Lipidol. 9390;3:009-233. Marland Kitchen          Passed - Patient is not pregnant      Passed - Valid encounter within last 12 months    Recent Outpatient Visits           4 months ago Other fatigue   [facility] Teodora Medici, DO   6 months ago Ambulatory dysfunction   [place], DO   7 months ago Hypersomnia   [place], DO   1 year ago Nonintractable headache, unspecified chronicity pattern, unspecified headache type   [place], NP   1 year ago Hypersomnia   [facility] Kathrine Haddock, NP       Future Appointments             In 2 weeks [facility]

## 2021-10-12 NOTE — Telephone Encounter (Signed)
Requested medication (s) are due for refill today: yes  Requested medication (s) are on the active medication list: yes  Last refill:  09/13/21 #30/0  Future visit scheduled: no  Notes to clinic:  Unable to refill per protocol due to failed labs, no updated results.      Requested Prescriptions  Pending Prescriptions Disp Refills   lisinopril-hydrochlorothiazide (ZESTORETIC) 20-12.5 MG tablet [Pharmacy Med Name: LISINOPRIL-HCTZ 20-12.5 MG TAB] 30 tablet 0    Sig: Take 1 tablet by mouth daily.     Cardiovascular:  ACEI + Diuretic Combos Failed - 10/11/2021 11:49 AM      Failed - Na in normal range and within 180 days    Sodium  Date Value Ref Range Status  02/15/2021 141 135 - 145 mmol/L Final  04/04/2013 138 136 - 145 mmol/L Final         Failed - K in normal range and within 180 days    Potassium  Date Value Ref Range Status  02/15/2021 3.7 3.5 - 5.1 mmol/L Final  04/04/2013 4.3 3.5 - 5.1 mmol/L Final         Failed - Cr in normal range and within 180 days    Creat  Date Value Ref Range Status  06/23/2020 0.91 (H) 0.60 - 0.88 mg/dL Final    Comment:    For patients >22 years of age, the reference limit for Creatinine is approximately 13% higher for people identified as African-American. .    Creatinine, Ser  Date Value Ref Range Status  02/15/2021 0.70 0.44 - 1.00 mg/dL Final         Failed - eGFR is 30 or above and within 180 days    GFR, Est African American  Date Value Ref Range Status  05/08/2019 71 > OR = 60 mL/min/1.88m Final   GFR, Est Non African American  Date Value Ref Range Status  05/08/2019 61 > OR = 60 mL/min/1.71mFinal   GFR, Estimated  Date Value Ref Range Status  02/15/2021 >60 >60 mL/min Final    Comment:    (NOTE) Calculated using the CKD-EPI Creatinine Equation (2021)          Passed - Patient is not pregnant      Passed - Last BP in normal range    BP Readings from Last 1 Encounters:  06/17/21 (!) 103/58         Passed -  Valid encounter within last 6 months    Recent Outpatient Visits           4 months ago Other fatigue   [facility]DO   6 months ago Ambulatory dysfunction   [facility]DO   7 months ago Hypersomnia   [facility]DO   1 year ago Nonintractable headache, unspecified chronicity pattern, unspecified headache type   CHOppeloNP   1 year ago Hypersomnia   [facility]       Future Appointments             [facility]

## 2021-10-26 ENCOUNTER — Ambulatory Visit: Payer: Medicare HMO

## 2021-11-01 ENCOUNTER — Telehealth: Payer: Self-pay | Admitting: Internal Medicine

## 2021-11-01 DIAGNOSIS — I10 Essential (primary) hypertension: Secondary | ICD-10-CM

## 2021-11-02 NOTE — Telephone Encounter (Signed)
Unable to refill per protocol, request is too soon. Last refill was 10/12/21 for 90 days.E-Prescribing Status: Receipt confirmed by pharmacy (10/12/2021 4:27 PM EDT). Will refuse.  Requested Prescriptions  Pending Prescriptions Disp Refills  . lisinopril-hydrochlorothiazide (ZESTORETIC) 20-12.5 MG tablet [Pharmacy Med Name: LISINOPRIL-HCTZ 20-12.5 MG TAB] 30 tablet 0    Sig: Take 1 tablet by mouth daily.     Cardiovascular:  ACEI + Diuretic Combos Failed - 11/01/2021 12:09 PM      Failed - Na in normal range and within 180 days    Sodium  Date Value Ref Range Status  02/15/2021 141 135 - 145 mmol/L Final  04/04/2013 138 136 - 145 mmol/L Final         Failed - K in normal range and within 180 days    Potassium  Date Value Ref Range Status  02/15/2021 3.7 3.5 - 5.1 mmol/L Final  04/04/2013 4.3 3.5 - 5.1 mmol/L Final         Failed - Cr in normal range and within 180 days    Creat  Date Value Ref Range Status  06/23/2020 0.91 (H) 0.60 - 0.88 mg/dL Final    Comment:    For patients >29 years of age, the reference limit for Creatinine is approximately 13% higher for people identified as African-American. .    Creatinine, Ser  Date Value Ref Range Status  02/15/2021 0.70 0.44 - 1.00 mg/dL Final         Failed - eGFR is 30 or above and within 180 days    GFR, Est African American  Date Value Ref Range Status  05/08/2019 71 > OR = 60 mL/min/1.29m Final   GFR, Est Non African American  Date Value Ref Range Status  05/08/2019 61 > OR = 60 mL/min/1.782mFinal   GFR, Estimated  Date Value Ref Range Status  02/15/2021 >60 >60 mL/min Final    Comment:    (NOTE) Calculated using the CKD-EPI Creatinine Equation (2021)          Passed - Patient is not pregnant      Passed - Last BP in normal range    BP Readings from Last 1 Encounters:  06/17/21 (!) 103/58         Passed - Valid encounter within last 6 months    Recent Outpatient Visits          5 months ago Other  fatigue   CHPe[place]DO   6 months ago Ambulatory dysfunction   CHStarr SchoolDO   8 months ago Hypersomnia   CHCot[place]DO   1 year ago Nonintractable headache, unspecified chronicity pattern, unspecified headache type   CHBarronNP   1 year ago Hypersomnia   [facility]

## 2021-11-08 ENCOUNTER — Other Ambulatory Visit: Payer: Self-pay | Admitting: Internal Medicine

## 2021-11-08 DIAGNOSIS — I10 Essential (primary) hypertension: Secondary | ICD-10-CM

## 2021-11-09 NOTE — Telephone Encounter (Signed)
rx was sent to pharmacy on 10/12/21 #90/0, that supply will last until 12/2021.  Requested Prescriptions  Pending Prescriptions Disp Refills  . lisinopril-hydrochlorothiazide (ZESTORETIC) 20-12.5 MG tablet [Pharmacy Med Name: LISINOPRIL-HCTZ 20-12.5 MG TAB] 30 tablet 0    Sig: Take 1 tablet by mouth daily.     Cardiovascular:  ACEI + Diuretic Combos Failed - 11/08/2021 12:07 PM      Failed - Na in normal range and within 180 days    Sodium  Date Value Ref Range Status  02/15/2021 141 135 - 145 mmol/L Final  04/04/2013 138 136 - 145 mmol/L Final         Failed - K in normal range and within 180 days    Potassium  Date Value Ref Range Status  02/15/2021 3.7 3.5 - 5.1 mmol/L Final  04/04/2013 4.3 3.5 - 5.1 mmol/L Final         Failed - Cr in normal range and within 180 days    Creat  Date Value Ref Range Status  06/23/2020 0.91 (H) 0.60 - 0.88 mg/dL Final    Comment:    For patients >37 years of age, the reference limit for Creatinine is approximately 13% higher for people identified as African-American. .    Creatinine, Ser  Date Value Ref Range Status  02/15/2021 0.70 0.44 - 1.00 mg/dL Final         Failed - eGFR is 30 or above and within 180 days    GFR, Est African American  Date Value Ref Range Status  05/08/2019 71 > OR = 60 mL/min/1.63m Final   GFR, Est Non African American  Date Value Ref Range Status  05/08/2019 61 > OR = 60 mL/min/1.783mFinal   GFR, Estimated  Date Value Ref Range Status  02/15/2021 >60 >60 mL/min Final    Comment:    (NOTE) Calculated using the CKD-EPI Creatinine Equation (2021)          Passed - Patient is not pregnant      Passed - Last BP in normal range    BP Readings from Last 1 Encounters:  06/17/21 (!) 103/58         Passed - Valid encounter within last 6 months    Recent Outpatient Visits          5 months ago Other fatigue   CHLongDO   7 months ago Ambulatory  dysfunction   CHFrew[place]DO   8 months ago Hypersomnia   CHEuh[place]DO   1 year ago Nonintractable headache, unspecified chronicity pattern, unspecified headache type   CHA[place]NP   1 year ago Hypersomnia   [facility]

## 2021-12-09 ENCOUNTER — Other Ambulatory Visit: Payer: Self-pay | Admitting: Internal Medicine

## 2021-12-09 NOTE — Telephone Encounter (Signed)
Requested Prescriptions  Pending Prescriptions Disp Refills   FLUoxetine (PROZAC) 20 MG capsule [Pharmacy Med Name: FLUOXETINE HCL 20 MG CAPSULE] 90 capsule 0    Sig: Take 1 capsule (20 mg total) by mouth daily.     Psychiatry:  Antidepressants - SSRI Failed - 12/09/2021 12:53 PM      Failed - Valid encounter within last 6 months    Recent Outpatient Visits           6 months ago Other fatigue   [place], DO   8 months ago Ambulatory dysfunction   [place], DO   9 months ago Hypersomnia   [place], DO   1 year ago Nonintractable headache, unspecified chronicity pattern, unspecified headache type   [place], NP   1 year ago Hypersomnia   [place], NP              Passed - Completed PHQ-2 or PHQ-9 in the last 360 days

## 2021-12-13 ENCOUNTER — Other Ambulatory Visit: Payer: Self-pay | Admitting: Internal Medicine

## 2021-12-13 DIAGNOSIS — I10 Essential (primary) hypertension: Secondary | ICD-10-CM

## 2021-12-13 DIAGNOSIS — I4891 Unspecified atrial fibrillation: Secondary | ICD-10-CM

## 2021-12-13 DIAGNOSIS — I25111 Atherosclerotic heart disease of native coronary artery with angina pectoris with documented spasm: Secondary | ICD-10-CM

## 2021-12-14 NOTE — Telephone Encounter (Signed)
Requested medication (s) are due for refill today: no  Requested medication (s) are on the active medication list: yes  Last refill:  10/12/21  #90   Future visit scheduled: no  Notes to clinic:  overdue lab work/sent pt message to call back to make appt.   Requested Prescriptions  Pending Prescriptions Disp Refills   lisinopril-hydrochlorothiazide (ZESTORETIC) 20-12.5 MG tablet [Pharmacy Med Name: LISINOPRIL-HCTZ 20-12.5 MG TAB] 30 tablet     Sig: Take 1 tablet by mouth daily.     Cardiovascular:  ACEI + Diuretic Combos Failed - 12/13/2021  1:09 PM      Failed - Na in normal range and within 180 days    Sodium  Date Value Ref Range Status  02/15/2021 141 135 - 145 mmol/L Final  04/04/2013 138 136 - 145 mmol/L Final         Failed - K in normal range and within 180 days    Potassium  Date Value Ref Range Status  02/15/2021 3.7 3.5 - 5.1 mmol/L Final  04/04/2013 4.3 3.5 - 5.1 mmol/L Final         Failed - Cr in normal range and within 180 days    Creat  Date Value Ref Range Status  06/23/2020 0.91 (H) 0.60 - 0.88 mg/dL Final    Comment:    For patients >66 years of age, the reference limit for Creatinine is approximately 13% higher for people identified as African-American. .    Creatinine, Ser  Date Value Ref Range Status  02/15/2021 0.70 0.44 - 1.00 mg/dL Final         Failed - eGFR is 30 or above and within 180 days    GFR, Est African American  Date Value Ref Range Status  05/08/2019 71 > OR = 60 mL/min/1.39m Final   GFR, Est Non African American  Date Value Ref Range Status  05/08/2019 61 > OR = 60 mL/min/1.715mFinal   GFR, Estimated  Date Value Ref Range Status  02/15/2021 >60 >60 mL/min Final    Comment:    (NOTE) Calculated using the CKD-EPI Creatinine Equation (2021)          Failed - Valid encounter within last 6 months    Recent Outpatient Visits           6 months ago Other fatigue   [place] [facility]   8 months ago Ambulatory dysfunction   [facility]   9 months ago Hypersomnia   [place]   1 year ago Nonintractable headache, unspecified chronicity pattern, unspecified headache type   [facility]NP   1 year ago Hypersomnia   [place]              Passed - Patient is not pregnant      Passed - Last BP in normal range    BP Readings from Last 1 Encounters:  06/17/21 (!) 103/58         Signed Prescriptions Disp Refills   isosorbide mononitrate (IMDUR) 60 MG 24 hr tablet 90 tablet 0    Sig: Take 1 tablet (60 mg total) by mouth daily.     Cardiovascular:  Nitrates Passed - 12/13/2021  1:09 PM      Passed - Last BP in normal range    BP Readings from Last 1 Encounters:  06/17/21 (!) 103/58  Passed - Last Heart Rate in normal range    Pulse Readings from Last 1 Encounters:  06/17/21 81         Passed - Valid encounter within last 12 months    Recent Outpatient Visits           6 months ago Other fatigue   [place], DO   8 months ago Ambulatory dysfunction   [place], DO   9 months ago Hypersomnia   [place], DO   1 year ago Nonintractable headache, unspecified chronicity pattern, unspecified headache type   [place], NP   1 year ago Hypersomnia   [facility] Kathrine Haddock, NP

## 2021-12-14 NOTE — Telephone Encounter (Signed)
Requested Prescriptions  Pending Prescriptions Disp Refills   lisinopril-hydrochlorothiazide (ZESTORETIC) 20-12.5 MG tablet [Pharmacy Med Name: LISINOPRIL-HCTZ 20-12.5 MG TAB] 30 tablet     Sig: Take 1 tablet by mouth daily.     Cardiovascular:  ACEI + Diuretic Combos Failed - 12/13/2021  1:09 PM      Failed - Na in normal range and within 180 days    Sodium  Date Value Ref Range Status  02/15/2021 141 135 - 145 mmol/L Final  04/04/2013 138 136 - 145 mmol/L Final         Failed - K in normal range and within 180 days    Potassium  Date Value Ref Range Status  02/15/2021 3.7 3.5 - 5.1 mmol/L Final  04/04/2013 4.3 3.5 - 5.1 mmol/L Final         Failed - Cr in normal range and within 180 days    Creat  Date Value Ref Range Status  06/23/2020 0.91 (H) 0.60 - 0.88 mg/dL Final    Comment:    For patients >27 years of age, the reference limit for Creatinine is approximately 13% higher for people identified as African-American. .    Creatinine, Ser  Date Value Ref Range Status  02/15/2021 0.70 0.44 - 1.00 mg/dL Final         Failed - eGFR is 30 or above and within 180 days    GFR, Est African American  Date Value Ref Range Status  05/08/2019 71 > OR = 60 mL/min/1.33m Final   GFR, Est Non African American  Date Value Ref Range Status  05/08/2019 61 > OR = 60 mL/min/1.755mFinal   GFR, Estimated  Date Value Ref Range Status  02/15/2021 >60 >60 mL/min Final    Comment:    (NOTE) Calculated using the CKD-EPI Creatinine Equation (2021)          Failed - Valid encounter within last 6 months    Recent Outpatient Visits           6 months ago Other fatigue   [place]   8 months ago Ambulatory dysfunction   [place]DO   9 months ago Hypersomnia   [place]   1 year ago Nonintractable headache, unspecified chronicity pattern,  unspecified headache type   [place]NP   1 year ago Hypersomnia   [place]              Passed - Patient is not pregnant      Passed - Last BP in normal range    BP Readings from Last 1 Encounters:  06/17/21 (!) 103/58          isosorbide mononitrate (IMDUR) 60 MG 24 hr tablet [Pharmacy Med Name: ISOSORBIDE MONONIT ER 60 MG TB] 90 tablet 0    Sig: Take 1 tablet (60 mg total) by mouth daily.     Cardiovascular:  Nitrates Passed - 12/13/2021  1:09 PM      Passed - Last BP in normal range    BP Readings from Last 1 Encounters:  06/17/21 (!) 103/58         Passed - Last Heart Rate in normal range    Pulse Readings from Last 1 Encounters:  06/17/21 81         Passed - Valid encounter within last 12 months  Recent Outpatient Visits           6 months ago Other fatigue   [facility] Teodora Medici, DO   8 months ago Ambulatory dysfunction   [place], DO   9 months ago Hypersomnia   [place], DO   1 year ago Nonintractable headache, unspecified chronicity pattern, unspecified headache type   [facility] Kathrine Haddock, NP   1 year ago Hypersomnia   [facility] Kathrine Haddock, NP

## 2022-01-07 ENCOUNTER — Other Ambulatory Visit: Payer: Self-pay | Admitting: Unknown Physician Specialty

## 2022-01-07 DIAGNOSIS — E785 Hyperlipidemia, unspecified: Secondary | ICD-10-CM

## 2022-01-07 NOTE — Telephone Encounter (Signed)
Requested Prescriptions  Pending Prescriptions Disp Refills   atorvastatin (LIPITOR) 80 MG tablet [Pharmacy Med Name: ATORVASTATIN 80 MG TABLET] 90 tablet 0    Sig: Take 1 tablet (80 mg total) by mouth at bedtime.     Cardiovascular:  Antilipid - Statins Failed - 01/07/2022  2:18 PM      Failed - Lipid Panel in normal range within the last 12 months    Cholesterol  Date Value Ref Range Status  06/23/2020 179 <200 mg/dL Final   LDL Cholesterol (Calc)  Date Value Ref Range Status  06/23/2020 94 mg/dL (calc) Final    Comment:    Reference range: <100 . Desirable range <100 mg/dL for primary prevention;   <70 mg/dL for patients with CHD or diabetic patients  with > or = 2 CHD risk factors. Marland Kitchen LDL-C is now calculated using the Martin-Hopkins  calculation, which is a validated novel method providing  better accuracy than the Friedewald equation in the  estimation of LDL-C.  Cresenciano Genre et al. Annamaria Helling. 3559;741(63): 2061-2068  (http://education.[facility].com/faq/FAQ164)    HDL  Date Value Ref Range Status  06/23/2020 35 (L) > OR = 50 mg/dL Final   Triglycerides  Date Value Ref Range Status  06/23/2020 377 (H) <150 mg/dL Final    Comment:    . If a non-fasting specimen was collected, consider repeat triglyceride testing on a fasting specimen if clinically indicated.  Yates Decamp et al. J. of Clin. Lipidol. 8453;6:[id number]. Marland Kitchen          Passed - Patient is not pregnant      Passed - Valid encounter within last 12 months    Recent Outpatient Visits           7 months ago Other fatigue   [facility] Teodora Medici, DO   9 months ago Ambulatory dysfunction   [place], DO   10 months ago Hypersomnia   [place], DO   1 year ago Nonintractable headache, unspecified chronicity pattern, unspecified headache type   [facility] Kathrine Haddock, NP   1  year ago Hypersomnia   [facility] Kathrine Haddock, NP

## 2022-01-11 ENCOUNTER — Other Ambulatory Visit: Payer: Self-pay | Admitting: Internal Medicine

## 2022-01-11 DIAGNOSIS — I10 Essential (primary) hypertension: Secondary | ICD-10-CM

## 2022-01-11 NOTE — Telephone Encounter (Signed)
Requested medications are due for refill today.  yes  Requested medications are on the active medications list.  yes  Last refill. 12/14/2021 #30 0 rf  Future visit scheduled.   no  Notes to clinic.  Pt is overdue for an OV. Pt already given a courtesy refill.    Requested Prescriptions  Pending Prescriptions Disp Refills   lisinopril-hydrochlorothiazide (ZESTORETIC) 20-12.5 MG tablet [Pharmacy Med Name: LISINOPRIL-HCTZ 20-12.5 MG TAB] 30 tablet 0    Sig: Take 1 tablet by mouth daily.     Cardiovascular:  ACEI + Diuretic Combos Failed - 01/11/2022  8:37 AM      Failed - Na in normal range and within 180 days    Sodium  Date Value Ref Range Status  02/15/2021 141 135 - 145 mmol/L Final  04/04/2013 138 136 - 145 mmol/L Final         Failed - K in normal range and within 180 days    Potassium  Date Value Ref Range Status  02/15/2021 3.7 3.5 - 5.1 mmol/L Final  04/04/2013 4.3 3.5 - 5.1 mmol/L Final         Failed - Cr in normal range and within 180 days    Creat  Date Value Ref Range Status  06/23/2020 0.91 (H) 0.60 - 0.88 mg/dL Final    Comment:    For patients >44 years of age, the reference limit for Creatinine is approximately 13% higher for people identified as African-American. .    Creatinine, Ser  Date Value Ref Range Status  02/15/2021 0.70 0.44 - 1.00 mg/dL Final         Failed - eGFR is 30 or above and within 180 days    GFR, Est African American  Date Value Ref Range Status  05/08/2019 71 > OR = 60 mL/min/1.93m Final   GFR, Est Non African American  Date Value Ref Range Status  05/08/2019 61 > OR = 60 mL/min/1.739mFinal   GFR, Estimated  Date Value Ref Range Status  02/15/2021 >60 >60 mL/min Final    Comment:    (NOTE) Calculated using the CKD-EPI Creatinine Equation (2021)          Failed - Valid encounter within last 6 months    Recent Outpatient Visits           7 months ago Other fatigue   [place]DO   9 months ago Ambulatory dysfunction   [place]   10 months ago Hypersomnia   [place]   1 year ago Nonintractable headache, unspecified chronicity pattern, unspecified headache type   [place]   1 year ago Hypersomnia   [place]            Passed - Patient is not pregnant      Passed - Last BP in normal range    BP Readings from Last 1 Encounters:  06/17/21 (!) 103/58

## 2022-01-31 DIAGNOSIS — E785 Hyperlipidemia, unspecified: Secondary | ICD-10-CM | POA: Insufficient documentation

## 2022-03-07 ENCOUNTER — Other Ambulatory Visit: Payer: Self-pay | Admitting: Internal Medicine

## 2022-03-08 NOTE — Telephone Encounter (Signed)
Pt called, someone picked up the line but no response when saying hello. Tried to call back and unable to LVM. Pt is due for OV for medication refills.

## 2022-03-08 NOTE — Telephone Encounter (Signed)
Requested Prescriptions  Pending Prescriptions Disp Refills   FLUoxetine (PROZAC) 20 MG capsule [Pharmacy Med Name: FLUOXETINE HCL 20 MG CAPSULE] 30 capsule 0    Sig: Take 1 capsule (20 mg total) by mouth daily.     Psychiatry:  Antidepressants - SSRI Failed - 03/07/2022  7:33 AM      Failed - Valid encounter within last 6 months    Recent Outpatient Visits           9 months ago Other fatigue   [facility] Teodora Medici, DO   11 months ago Ambulatory dysfunction   [facility] Teodora Medici, DO   1 year ago [facility] Teodora Medici, DO   1 year ago Nonintractable headache, unspecified chronicity pattern, unspecified headache type   [place], NP   1 year ago [place], NP              Passed - Completed PHQ-2 or PHQ-9 in the last 360 days

## 2022-03-11 ENCOUNTER — Other Ambulatory Visit: Payer: Self-pay | Admitting: Internal Medicine

## 2022-03-11 DIAGNOSIS — I4891 Unspecified atrial fibrillation: Secondary | ICD-10-CM

## 2022-03-11 DIAGNOSIS — I25111 Atherosclerotic heart disease of native coronary artery with angina pectoris with documented spasm: Secondary | ICD-10-CM

## 2022-03-11 NOTE — Telephone Encounter (Signed)
Patient will need an office visit for further refills. Requested Prescriptions  Pending Prescriptions Disp Refills   isosorbide mononitrate (IMDUR) 60 MG 24 hr tablet [Pharmacy Med Name: ISOSORBIDE MONONIT ER 60 MG TB] 90 tablet 0    Sig: Take 1 tablet (60 mg total) by mouth daily.     Cardiovascular:  Nitrates Passed - 03/11/2022 12:27 PM      Passed - Last BP in normal range    BP Readings from Last 1 Encounters:  06/17/21 (!) 103/58         Passed - Last Heart Rate in normal range    Pulse Readings from Last 1 Encounters:  06/17/21 81         Passed - Valid encounter within last 12 months    Recent Outpatient Visits           9 months ago Other fatigue   [place], DO   11 months ago Ambulatory dysfunction   [place], DO   1 year ago [facility] Teodora Medici, DO   1 year ago Nonintractable headache, unspecified chronicity pattern, unspecified headache type   [facility] Kathrine Haddock, NP   1 year ago [facility] Kathrine Haddock, NP

## 2022-04-04 ENCOUNTER — Other Ambulatory Visit: Payer: Self-pay | Admitting: Internal Medicine

## 2022-04-04 DIAGNOSIS — E785 Hyperlipidemia, unspecified: Secondary | ICD-10-CM

## 2022-04-05 NOTE — Telephone Encounter (Signed)
Requested medications are due for refill today.  yes  Requested medications are on the active medications list.  yes  Last refill. 01/07/2022 #90 0 rf  Future visit scheduled.   no  Notes to clinic.  Labs are expired.     Requested Prescriptions  Pending Prescriptions Disp Refills   atorvastatin (LIPITOR) 80 MG tablet [Pharmacy Med Name: ATORVASTATIN 80 MG TABLET] 90 tablet 0    Sig: Take 1 tablet (80 mg total) by mouth at bedtime.     Cardiovascular:  Antilipid - Statins Failed - 04/04/2022  8:17 AM      Failed - Lipid Panel in normal range within the last 12 months    Cholesterol  Date Value Ref Range Status  06/23/2020 179 <200 mg/dL Final   LDL Cholesterol (Calc)  Date Value Ref Range Status  06/23/2020 94 mg/dL (calc) Final    Comment:    Reference range: <100 . Desirable range <100 mg/dL for primary prevention;   <70 mg/dL for patients with CHD or diabetic patients  with > or = 2 CHD risk factors. Marland Kitchen LDL-C is now calculated using the Martin-Hopkins  calculation, which is a validated novel method providing  better accuracy than the Friedewald equation in the  estimation of LDL-C.  Cresenciano Genre et al. Annamaria Helling. [id number]): 2061-2068  (http://education.[facility].com/faq/FAQ164)    HDL  Date Value Ref Range Status  06/23/2020 35 (L) > OR = 50 mg/dL Final   Triglycerides  Date Value Ref Range Status  06/23/2020 377 (H) <150 mg/dL Final    Comment:    . If a non-fasting specimen was collected, consider repeat triglyceride testing on a fasting specimen if clinically indicated.  Yates Decamp et al. J. of Clin. Lipidol. [id number]. Marland Kitchen          Passed - Patient is not pregnant      Passed - Valid encounter within last 12 months    Recent Outpatient Visits           10 months ago Other fatigue   [facility] Teodora Medici, [place]   12 months ago Ambulatory dysfunction   [facility] Teodora Medici,  DO   1 year ago [facility] Teodora Medici, DO   1 year ago Nonintractable headache, unspecified chronicity pattern, unspecified headache type   [facility] Kathrine Haddock, NP   1 year ago [facility] Kathrine Haddock, NP

## 2022-06-02 ENCOUNTER — Telehealth: Payer: Self-pay | Admitting: Internal Medicine

## 2022-06-02 NOTE — Telephone Encounter (Signed)
Called patient to schedule Medicare Annual Wellness Visit (AWV). Left message for patient to call back and schedule Medicare Annual Wellness Visit (AWV).  Last date of AWV: 10/22/2021  Please schedule an appointment at any time with [facility].  If any questions, please contact me at [phone].  Thank you ,  Randon Goldsmith Care Guide [facility] AWV TEAM Direct Dial: [phone]

## 2022-06-04 ENCOUNTER — Other Ambulatory Visit: Payer: Self-pay | Admitting: Internal Medicine

## 2022-06-06 ENCOUNTER — Other Ambulatory Visit: Payer: Self-pay | Admitting: Internal Medicine

## 2022-06-06 DIAGNOSIS — I4891 Unspecified atrial fibrillation: Secondary | ICD-10-CM

## 2022-06-06 DIAGNOSIS — I25111 Atherosclerotic heart disease of native coronary artery with angina pectoris with documented spasm: Secondary | ICD-10-CM

## 2022-06-06 NOTE — Telephone Encounter (Signed)
Requested medications are due for refill today.  Unsure  Requested medications are on the active medications list.  yes  Last refill. 03/08/2022 #30 0 rf  Future visit scheduled.   no  Notes to clinic.  Pt is more than 3 months overdue for OV. Pt already given a courtesy refill.    Requested Prescriptions  Pending Prescriptions Disp Refills   FLUoxetine (PROZAC) 20 MG capsule [Pharmacy Med Name: FLUOXETINE HCL 20 MG CAPSULE] 90 capsule 0    Sig: Take 1 capsule (20 mg total) by mouth daily.     Psychiatry:  Antidepressants - SSRI Failed - 06/04/2022  9:49 AM      Failed - Completed PHQ-2 or PHQ-9 in the last 360 days      Failed - Valid encounter within last 6 months    Recent Outpatient Visits           1 year ago Other fatigue   [facility] Margarita Mail, DO   1 year ago Ambulatory dysfunction   [facility] Margarita Mail, DO   1 year ago Hypersomnia   [facility] Margarita Mail, DO   1 year ago Nonintractable headache, unspecified chronicity pattern, unspecified headache type   [facility] Gabriel Cirri, NP   1 year ago Hypersomnia   [facility] Gabriel Cirri, NP

## 2022-06-07 NOTE — Telephone Encounter (Signed)
Called pt - left message to return call and schedule an appt.

## 2022-06-07 NOTE — Telephone Encounter (Signed)
Courtesy refill. Patient will need an office visit for further refills. Requested Prescriptions  Pending Prescriptions Disp Refills   isosorbide mononitrate (IMDUR) 60 MG 24 hr tablet [Pharmacy Med Name: ISOSORBIDE MONONIT ER 60 MG TB] 30 tablet 0    Sig: Take 1 tablet (60 mg total) by mouth daily.     Cardiovascular:  Nitrates Failed - 06/06/2022  7:14 AM      Failed - Valid encounter within last 12 months    Recent Outpatient Visits           1 year ago Other fatigue   [facility] Margarita Mail, DO   1 year ago Ambulatory dysfunction   [facility] Margarita Mail, DO   1 year ago Hypersomnia   [facility] Margarita Mail, DO   1 year ago Nonintractable headache, unspecified chronicity pattern, unspecified headache type   [facility] Gabriel Cirri, NP   1 year ago Hypersomnia   [facility] Gabriel Cirri, NP              Passed - Last BP in normal range    BP Readings from Last 1 Encounters:  06/17/21 (!) 103/58         Passed - Last Heart Rate in normal range    Pulse Readings from Last 1 Encounters:  06/17/21 81

## 2022-07-04 ENCOUNTER — Other Ambulatory Visit: Payer: Self-pay | Admitting: Internal Medicine

## 2022-07-04 DIAGNOSIS — I4891 Unspecified atrial fibrillation: Secondary | ICD-10-CM

## 2022-07-04 DIAGNOSIS — I25111 Atherosclerotic heart disease of native coronary artery with angina pectoris with documented spasm: Secondary | ICD-10-CM

## 2022-07-05 NOTE — Telephone Encounter (Signed)
Requested medication (s) are due for refill today: yes  Requested medication (s) are on the active medication list: yes  Last refill:  06/07/22  Future visit scheduled: no  Notes to clinic:  Unable to refill per protocol, courtesy refill already given, routing for provider approval.      Requested Prescriptions  Pending Prescriptions Disp Refills   isosorbide mononitrate (IMDUR) 60 MG 24 hr tablet [Pharmacy Med Name: ISOSORBIDE MONONIT ER 60 MG TB] 30 tablet 0    Sig: Take 1 tablet (60 mg total) by mouth daily.     Cardiovascular:  Nitrates Failed - 07/04/2022  7:15 AM      Failed - Valid encounter within last 12 months    Recent Outpatient Visits           1 year ago Other fatigue   [facility] Margarita Mail, DO   1 year ago Ambulatory dysfunction   [facility] Margarita Mail, DO   1 year ago Hypersomnia   [facility] Margarita Mail, DO   1 year ago Nonintractable headache, unspecified chronicity pattern, unspecified headache type   [facility] Gabriel Cirri, NP   2 years ago Hypersomnia   [facility] Gabriel Cirri, NP              Passed - Last BP in normal range    BP Readings from Last 1 Encounters:  06/17/21 (!) 103/58         Passed - Last Heart Rate in normal range    Pulse Readings from Last 1 Encounters:  06/17/21 81

## 2022-08-01 ENCOUNTER — Other Ambulatory Visit: Payer: Self-pay | Admitting: Internal Medicine

## 2022-08-01 DIAGNOSIS — I4891 Unspecified atrial fibrillation: Secondary | ICD-10-CM

## 2022-08-01 DIAGNOSIS — I25111 Atherosclerotic heart disease of native coronary artery with angina pectoris with documented spasm: Secondary | ICD-10-CM

## 2022-08-02 NOTE — Telephone Encounter (Signed)
Requested Prescriptions  Pending Prescriptions Disp Refills   isosorbide mononitrate (IMDUR) 60 MG 24 hr tablet [Pharmacy Med Name: ISOSORBIDE MONONIT ER 60 MG TB] 90 tablet 0    Sig: Take 1 tablet (60 mg total) by mouth daily.     Cardiovascular:  Nitrates Failed - 08/01/2022 11:38 AM      Failed - Valid encounter within last 12 months    Recent Outpatient Visits           1 year ago Other fatigue   [facility] Margarita Mail, DO   1 year ago Ambulatory dysfunction   [facility] Margarita Mail, DO   1 year ago Hypersomnia   [facility] Margarita Mail, DO   2 years ago Nonintractable headache, unspecified chronicity pattern, unspecified headache type   [facility] Gabriel Cirri, NP   2 years ago Hypersomnia   [facility] Gabriel Cirri, NP              Passed - Last BP in normal range    BP Readings from Last 1 Encounters:  06/17/21 (!) 103/58         Passed - Last Heart Rate in normal range    Pulse Readings from Last 1 Encounters:  06/17/21 81

## 2022-08-25 ENCOUNTER — Telehealth: Payer: Self-pay | Admitting: *Deleted

## 2022-08-25 NOTE — Telephone Encounter (Signed)
I connected with Samantha [name] on 7/25 at 1132 by telephone and verified that I am speaking with the correct person using two identifiers. According to the patient's chart they are due for follow up with [facility]. Pt scheduled. She has to rely on neighbor for transportation.  Nothing further was needed at the end of our conversation.

## 2022-08-29 ENCOUNTER — Other Ambulatory Visit: Payer: Self-pay | Admitting: Internal Medicine

## 2022-08-29 DIAGNOSIS — I25111 Atherosclerotic heart disease of native coronary artery with angina pectoris with documented spasm: Secondary | ICD-10-CM

## 2022-08-29 DIAGNOSIS — I4891 Unspecified atrial fibrillation: Secondary | ICD-10-CM

## 2022-08-30 NOTE — Telephone Encounter (Signed)
Called pharmacy - Rx refill sent 08/02/2022

## 2022-08-30 NOTE — Telephone Encounter (Signed)
Requested Prescriptions  Refused Prescriptions Disp Refills   isosorbide mononitrate (IMDUR) 60 MG 24 hr tablet [Pharmacy Med Name: ISOSORBIDE MONONIT ER 60 MG TB] 30 tablet 0    Sig: Take 1 tablet (60 mg total) by mouth daily.     Cardiovascular:  Nitrates Failed - 08/29/2022  7:17 AM      Failed - Valid encounter within last 12 months    Recent Outpatient Visits           1 year ago Other fatigue   [facility] Margarita Mail, DO   1 year ago Ambulatory dysfunction   [facility] Margarita Mail, DO   1 year ago Hypersomnia   [facility] Margarita Mail, DO   2 years ago Nonintractable headache, unspecified chronicity pattern, unspecified headache type   [facility] Gabriel Cirri, NP   2 years ago Hypersomnia   [facility] Gabriel Cirri, NP       Future Appointments             In 1 week Margarita Mail, DO [facility] [place]            Passed - Last BP in normal range    BP Readings from Last 1 Encounters:  06/17/21 (!) 103/58         Passed - Last Heart Rate in normal range    Pulse Readings from Last 1 Encounters:  06/17/21 81

## 2022-09-01 ENCOUNTER — Other Ambulatory Visit: Payer: Self-pay | Admitting: Internal Medicine

## 2022-09-01 NOTE — Telephone Encounter (Signed)
Requested medication (s) are due for refill today: yes Requested medication (s) are on the active medication list: yes  Last refill:  06/06/22 #90  Future visit scheduled: yes   Notes to clinic:  >1 year last OV has OV day when rx due - please review   Requested Prescriptions  Pending Prescriptions Disp Refills   FLUoxetine (PROZAC) 20 MG capsule [Pharmacy Med Name: FLUOXETINE HCL 20 MG CAPSULE] 90 capsule 0    Sig: Take 1 capsule (20 mg total) by mouth daily.     Psychiatry:  Antidepressants - SSRI Failed - 09/01/2022  7:25 AM      Failed - Completed PHQ-2 or PHQ-9 in the last 360 days      Failed - Valid encounter within last 6 months    Recent Outpatient Visits           1 year ago Other fatigue   [facility] Margarita Mail, DO   1 year ago Ambulatory dysfunction   [facility] Margarita Mail, DO   1 year ago Hypersomnia   [facility] Margarita Mail, DO   2 years ago Nonintractable headache, unspecified chronicity pattern, unspecified headache type   [facility] Gabriel Cirri, NP   2 years ago Hypersomnia   [facility] Gabriel Cirri, NP       Future Appointments             In 5 days Margarita Mail, DO [facility]

## 2022-09-05 NOTE — Progress Notes (Signed)
Established Patient Office Visit  Subjective:  Patient ID: Samantha [name], female    DOB: [birth year]  Age: 89 y.o. MRN: [id number]  CC:  No chief complaint on file.   HPI Samantha [name] presents for follow up.   Hypertension/PAF: -Medications: Lisinopril-HCTZ 20-12.5, Metoprolol 50, Imdur 60, Eliquis 5 BID -Patient is compliant with above medications and reports no side effects. -Checking BP at home (average): doesn't check -Denies any SOB, CP, vision changes, LE edema or symptoms of hypotension   HLD: -Medications: Lipitor 80 -Patient is compliant with above medications and reports no side effects.  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 -Referral to ENT/audiology place at LOV, saw them last week  Past Medical History:  Diagnosis Date   A-fib (HCC) 06/07/2014   Arm fracture 03/10/2017   right   CAD (coronary artery disease) 06/07/2014   Closed right radial fracture 03/21/2017   Depression 09/24/2015   Dyslipidemia 08/16/2016   Dysphonia 08/16/2016   Hypertension    Mild aortic stenosis 08/22/2016   Echo July 2018   Mitral regurgitation 08/22/2016   Echo July 2018   Stroke [facility])    Vertigo    Wears hearing aid in right ear     Past Surgical History:  Procedure Laterality Date   ABDOMINAL HYSTERECTOMY     APPENDECTOMY     CHOLECYSTECTOMY     FRACTURE SURGERY      Family History  Problem Relation Age of Onset   Stroke Mother    Heart attack Father    Heart disease Sister    Diabetes Sister    Heart disease Brother    Heart attack Brother    Diabetes Maternal Grandfather     Ovarian cancer Paternal Grandmother    Cancer Brother        bladder cancer   Diabetes Brother    Heart disease Brother    Hypertension Brother    Heart attack Daughter     Social History   Socioeconomic History   Marital status: Widowed    Spouse name: Not on file   Number of children: 3   Years of education: Not on file   Highest education level: Not on file  Occupational History   Not on file  Tobacco Use   Smoking status: Never   Smokeless tobacco: Never  Vaping Use   Vaping status: Never Used  Substance and Sexual Activity   Alcohol use: No   Drug use: No   Sexual activity: Never  Other Topics Concern   Not on file  Social History Narrative   Only has 1 living child left   Social Determinants of Health   Financial Resource Strain: Low Risk  (10/22/2020)   Overall Financial Resource Strain (CARDIA)    Difficulty of Paying Living Expenses: Not very hard  Food Insecurity: No Food Insecurity (10/22/2020)   Hunger Vital Sign    Worried About Running Out of Food in the Last Year: Never true    Ran Out of Food in the Last Year: Never true  Transportation Needs: Unmet Transportation Needs (08/25/2022)  PRAPARE - Administrator, Civil Service (Medical): Yes    Lack of Transportation (Non-Medical): Yes  Physical Activity: Inactive (10/22/2020)   Exercise Vital Sign    Days of Exercise per Week: 0 days    Minutes of Exercise per Session: 0 min  Stress: No Stress Concern Present (10/22/2020)   [employer] of Occupational Health - Occupational Stress Questionnaire    Feeling of Stress : Not at all  Social Connections: Moderately Isolated (10/22/2020)   Social Connection and Isolation Panel [NHANES]    Frequency of Communication with Friends and Family: More than three times a week    Frequency of Social Gatherings with Friends and Family: Three times a week    Attends Religious Services: More than 4 times per year    Active Member of Clubs or  Organizations: No    Attends Banker Meetings: Never    Marital Status: Widowed  Intimate Partner Violence: Not At Risk (10/22/2020)   Humiliation, Afraid, Rape, and Kick questionnaire    Fear of Current or Ex-Partner: No    Emotionally Abused: No    Physically Abused: No    Sexually Abused: No    Outpatient Medications Prior to Visit  Medication Sig Dispense Refill   apixaban (ELIQUIS) 5 MG TABS tablet Take 1 tablet (5 mg total) by mouth 2 (two) times daily. 60 tablet 0   atorvastatin (LIPITOR) 80 MG tablet Take 1 tablet (80 mg total) by mouth at bedtime. 90 tablet 0   Cholecalciferol 25 MCG (1000 UT) tablet Take 1,000 Units by mouth daily.     FLUoxetine (PROZAC) 20 MG capsule Take 1 capsule (20 mg total) by mouth daily. 90 capsule 0   isosorbide mononitrate (IMDUR) 60 MG 24 hr tablet Take 1 tablet (60 mg total) by mouth daily. 90 tablet 0   lisinopril-hydrochlorothiazide (ZESTORETIC) 20-12.5 MG tablet Take 1 tablet by mouth daily. 30 tablet 0   metoprolol succinate (TOPROL-XL) 50 MG 24 hr tablet Take 1 tablet (50 mg total) by mouth daily. Take with or immediately following a meal. 90 tablet 0   Multiple Vitamin (MULTIVITAMIN) tablet Take 1 tablet by mouth daily.     No facility-administered medications prior to visit.    Allergies  Allergen Reactions   Iodine Nausea And Vomiting   Levaquin [Levofloxacin] Other (See Comments)    Pt states she have nose bleeds and pain in arms and shoulders.    ROS Review of Systems  Constitutional:  Negative for activity change, appetite change, chills and fever.  HENT:  Positive for hearing loss.   Eyes:  Negative for visual disturbance.  Respiratory:  Negative for cough and shortness of breath.   Cardiovascular:  Negative for chest pain.  Gastrointestinal:  Negative for abdominal pain.  Neurological:  Negative for dizziness and headaches.      Objective:    Physical Exam Constitutional:      Appearance: Normal  appearance.  HENT:     Head: Normocephalic and atraumatic.  Eyes:     Conjunctiva/sclera: Conjunctivae normal.  Cardiovascular:     Rate and Rhythm: Normal rate and regular rhythm.  Pulmonary:     Effort: Pulmonary effort is normal.     Breath sounds: Normal breath sounds.  Musculoskeletal:        General: Normal range of motion.     Cervical back: Normal range of motion.  Skin:    General: Skin is warm and dry.  Neurological:  General: No focal deficit present.     Mental Status: She is alert. Mental status is at baseline.     Motor: Weakness present.     Coordination: Coordination abnormal.     Gait: Gait abnormal.  Psychiatric:        Mood and Affect: Mood normal.        Behavior: Behavior normal.     There were no vitals taken for this visit. Wt Readings from Last 3 Encounters:  05/31/21 148 lb 11.2 oz (67.4 kg)  04/08/21 148 lb 1.6 oz (67.2 kg)  04/02/21 147 lb (66.7 kg)     Health Maintenance Due  Topic Date Due   Zoster Vaccines- Shingrix (1 of 2) Never done   COVID-19 Vaccine (4 - 2023-24 season) 10/01/2021   Medicare Annual Wellness (AWV)  10/22/2021   DTaP/Tdap/Td (2 - Td or Tdap) 10/23/2021   INFLUENZA VACCINE  09/01/2022    There are no preventive care reminders to display for this patient.  Lab Results  Component Value Date   TSH 3.09 05/31/2021   Lab Results  Component Value Date   WBC 6.5 02/14/2021   HGB 14.4 02/14/2021   HCT 44.0 02/14/2021   MCV 95.0 02/14/2021   PLT 264 02/14/2021   Lab Results  Component Value Date   NA 141 02/15/2021   K 3.7 02/15/2021   CO2 27 02/15/2021   GLUCOSE 88 02/15/2021   BUN 15 02/15/2021   CREATININE 0.70 02/15/2021   BILITOT 1.0 02/14/2021   ALKPHOS 104 02/14/2021   AST 26 02/14/2021   ALT 20 02/14/2021   PROT 7.1 02/14/2021   ALBUMIN 3.4 (L) 02/14/2021   CALCIUM 8.9 02/15/2021   ANIONGAP 8 02/15/2021   Lab Results  Component Value Date   CHOL 179 06/23/2020   Lab Results  Component  Value Date   HDL 35 (L) 06/23/2020   Lab Results  Component Value Date   LDLCALC 94 06/23/2020   Lab Results  Component Value Date   TRIG 377 (H) 06/23/2020   Lab Results  Component Value Date   CHOLHDL 5.1 (H) 06/23/2020   Lab Results  Component Value Date   HGBA1C 5.7 (H) 05/08/2019      Assessment & Plan:   Problem List Items Addressed This Visit   None    No orders of the defined types were placed in this encounter.   Follow-up: No follow-ups on file.    Margarita Mail, DO

## 2022-09-06 ENCOUNTER — Encounter: Payer: Self-pay | Admitting: Internal Medicine

## 2022-09-06 ENCOUNTER — Ambulatory Visit (INDEPENDENT_AMBULATORY_CARE_PROVIDER_SITE_OTHER): Payer: Medicare HMO | Admitting: Internal Medicine

## 2022-09-06 VITALS — BP 152/100 | HR 89 | Temp 97.9°F | Resp 16 | Ht 60.0 in | Wt 135.7 lb

## 2022-09-06 DIAGNOSIS — R079 Chest pain, unspecified: Secondary | ICD-10-CM

## 2022-09-06 DIAGNOSIS — F331 Major depressive disorder, recurrent, moderate: Secondary | ICD-10-CM | POA: Diagnosis not present

## 2022-09-06 DIAGNOSIS — Z59819 Housing instability, housed unspecified: Secondary | ICD-10-CM

## 2022-09-06 LAB — CBC WITH DIFFERENTIAL/PLATELET
Absolute Monocytes: 580 cells/uL (ref 200–950)
Basophils Absolute: 32 cells/uL (ref 0–200)
Basophils Relative: 0.5 %
Eosinophils Absolute: 120 cells/uL (ref 15–500)
Eosinophils Relative: 1.9 %
HCT: 39.4 % (ref 35.0–45.0)
Hemoglobin: 12.9 g/dL (ref 11.7–15.5)
Lymphs Abs: 2085 cells/uL (ref 850–3900)
MCH: 31.3 pg (ref 27.0–33.0)
MCHC: 32.7 g/dL (ref 32.0–36.0)
MCV: 95.6 fL (ref 80.0–100.0)
MPV: 10.7 fL (ref 7.5–12.5)
Monocytes Relative: 9.2 %
Neutro Abs: 3484 cells/uL (ref 1500–7800)
Neutrophils Relative %: 55.3 %
Platelets: 281 10*3/uL (ref 140–400)
RBC: 4.12 10*6/uL (ref 3.80–5.10)
RDW: 12.4 % (ref 11.0–15.0)
Total Lymphocyte: 33.1 %
WBC: 6.3 10*3/uL (ref 3.8–10.8)

## 2022-09-06 MED ORDER — FLUOXETINE HCL 40 MG PO CAPS: 40.0000 mg | ORAL_CAPSULE | Freq: Every day | ORAL | 3 refills | Status: DC

## 2022-09-07 ENCOUNTER — Telehealth: Payer: Self-pay | Admitting: *Deleted

## 2022-09-07 NOTE — Progress Notes (Signed)
  Care Coordination  Outreach Note  09/07/2022 Name: PHILENA FEY MRN: [id number] DOB: 12-07-1928   Care Coordination Outreach Attempts: An unsuccessful telephone outreach was attempted today to offer the patient information about available care coordination services.  Follow Up Plan:  Additional outreach attempts will be made to offer the patient care coordination information and services.   Encounter Outcome:  No Answer  Burman Nieves, CCMA Care Coordination Care Guide Direct Dial: [phone]

## 2022-09-08 NOTE — Progress Notes (Signed)
  Care Coordination  Outreach Note  09/08/2022 Name: Samantha [name] MRN: [id number] DOB: [birth year]   Care Coordination Outreach Attempts: A second unsuccessful outreach was attempted today to offer the patient with information about available care coordination services.  Follow Up Plan:  Additional outreach attempts will be made to offer the patient care coordination information and services.   Encounter Outcome:  No Answer  Burman Nieves, CCMA Care Coordination Care Guide Direct Dial: [phone]

## 2022-09-09 NOTE — Progress Notes (Signed)
  Care Coordination  Outreach Note  09/09/2022 Name: Samantha [name] MRN: [id number] DOB: Dec 13, 1928   Care Coordination Outreach Attempts: A third unsuccessful outreach was attempted today to offer the patient with information about available care coordination services.  Follow Up Plan:  No further outreach attempts will be made at this time. We have been unable to contact the patient to offer or enroll patient in care coordination services  Encounter Outcome:  No Answer  Burman Nieves, [facility] Care Coordination Care Guide Direct Dial: [phone]

## 2022-09-12 ENCOUNTER — Emergency Department: Payer: Medicare HMO

## 2022-09-12 ENCOUNTER — Emergency Department
Admission: EM | Admit: 2022-09-12 | Discharge: 2022-09-13 | Disposition: A | Discharge status: Home / Self Care | Payer: Medicare HMO | Attending: Emergency Medicine | Admitting: Emergency Medicine

## 2022-09-12 ENCOUNTER — Encounter: Payer: Self-pay | Admitting: *Deleted

## 2022-09-12 ENCOUNTER — Other Ambulatory Visit: Payer: Self-pay

## 2022-09-12 DIAGNOSIS — I10 Essential (primary) hypertension: Secondary | ICD-10-CM | POA: Insufficient documentation

## 2022-09-12 DIAGNOSIS — Z1152 Encounter for screening for COVID-19: Secondary | ICD-10-CM | POA: Diagnosis not present

## 2022-09-12 DIAGNOSIS — I671 Cerebral aneurysm, nonruptured: Secondary | ICD-10-CM | POA: Diagnosis not present

## 2022-09-12 DIAGNOSIS — R519 Headache, unspecified: Secondary | ICD-10-CM | POA: Diagnosis not present

## 2022-09-12 DIAGNOSIS — Z7901 Long term (current) use of anticoagulants: Secondary | ICD-10-CM | POA: Insufficient documentation

## 2022-09-12 DIAGNOSIS — I6529 Occlusion and stenosis of unspecified carotid artery: Secondary | ICD-10-CM | POA: Diagnosis not present

## 2022-09-12 DIAGNOSIS — I672 Cerebral atherosclerosis: Secondary | ICD-10-CM | POA: Diagnosis not present

## 2022-09-12 DIAGNOSIS — Z8673 Personal history of transient ischemic attack (TIA), and cerebral infarction without residual deficits: Secondary | ICD-10-CM | POA: Insufficient documentation

## 2022-09-12 DIAGNOSIS — G4489 Other headache syndrome: Secondary | ICD-10-CM | POA: Diagnosis not present

## 2022-09-12 DIAGNOSIS — Z743 Need for continuous supervision: Secondary | ICD-10-CM | POA: Diagnosis not present

## 2022-09-12 LAB — TROPONIN I (HIGH SENSITIVITY): Troponin I (High Sensitivity): 10 ng/L (ref <18)

## 2022-09-12 LAB — BASIC METABOLIC PANEL
Anion gap: 9 (ref 5–15)
BUN: 8 mg/dL (ref 8–23)
CO2: 27 mmol/L (ref 22–32)
Calcium: 9.1 mg/dL (ref 8.9–10.3)
Chloride: 103 mmol/L (ref 98–111)
Creatinine, Ser: 0.7 mg/dL (ref 0.44–1.00)
GFR, Estimated: >60 mL/min (ref >60)
Glucose, Bld: 105 mg/dL — ABNORMAL HIGH (ref 70–99)
Potassium: 3.8 mmol/L (ref 3.5–5.1)
Sodium: 139 mmol/L (ref 135–145)

## 2022-09-12 LAB — CBC WITH DIFFERENTIAL/PLATELET
Abs Immature Granulocytes: 0.01 10*3/uL (ref 0.00–0.07)
Basophils Absolute: 0 10*3/uL (ref 0.0–0.1)
Basophils Relative: 0 %
Eosinophils Absolute: 0.2 10*3/uL (ref 0.0–0.5)
Eosinophils Relative: 3 %
HCT: 41.9 % (ref 36.0–46.0)
Hemoglobin: 13.5 g/dL (ref 12.0–15.0)
Immature Granulocytes: 0 %
Lymphocytes Relative: 43 %
Lymphs Abs: 2.2 10*3/uL (ref 0.7–4.0)
MCH: 31 pg (ref 26.0–34.0)
MCHC: 32.2 g/dL (ref 30.0–36.0)
MCV: 96.1 fL (ref 80.0–100.0)
Monocytes Absolute: 0.4 10*3/uL (ref 0.1–1.0)
Monocytes Relative: 8 %
Neutro Abs: 2.3 10*3/uL (ref 1.7–7.7)
Neutrophils Relative %: 46 %
Platelets: 232 10*3/uL (ref 150–400)
RBC: 4.36 MIL/uL (ref 3.87–5.11)
RDW: 13.1 % (ref 11.5–15.5)
WBC: 5.2 10*3/uL (ref 4.0–10.5)
nRBC: 0 % (ref 0.0–0.2)

## 2022-09-12 LAB — SARS CORONAVIRUS 2 BY RT PCR: SARS Coronavirus 2 by RT PCR: NEGATIVE

## 2022-09-12 LAB — TSH: TSH: 2.068 u[IU]/mL (ref 0.350–4.500)

## 2022-09-12 MED ORDER — DIPHENHYDRAMINE HCL 50 MG/ML IJ SOLN
25.0000 mg | Freq: Once | INTRAMUSCULAR | Status: AC
  Administered 2022-09-13: 25 mg via INTRAVENOUS
  Filled 2022-09-12: qty 1

## 2022-09-12 MED ORDER — METHYLPREDNISOLONE SODIUM SUCC 125 MG IJ SOLR
60.0000 mg | INTRAMUSCULAR | Status: AC
  Administered 2022-09-12: 60 mg via INTRAVENOUS
  Filled 2022-09-12: qty 2

## 2022-09-12 MED ORDER — SODIUM CHLORIDE 0.9 % IV BOLUS
500.0000 mL | Freq: Once | INTRAVENOUS | Status: AC
  Administered 2022-09-12: 500 mL via INTRAVENOUS

## 2022-09-12 MED ORDER — METOCLOPRAMIDE HCL 5 MG/ML IJ SOLN
5.0000 mg | INTRAMUSCULAR | Status: AC
  Administered 2022-09-12: 5 mg via INTRAVENOUS
  Filled 2022-09-12: qty 2

## 2022-09-12 MED ORDER — LABETALOL HCL 5 MG/ML IV SOLN
10.0000 mg | Freq: Once | INTRAVENOUS | Status: AC
  Administered 2022-09-12: 10 mg via INTRAVENOUS
  Filled 2022-09-12: qty 4

## 2022-09-12 MED ORDER — IOHEXOL 350 MG/ML SOLN
75.0000 mL | Freq: Once | INTRAVENOUS | Status: AC | PRN
  Administered 2022-09-13: 75 mL via INTRAVENOUS

## 2022-09-12 MED ORDER — ACETAMINOPHEN 500 MG PO TABS
1000.0000 mg | ORAL_TABLET | Freq: Once | ORAL | Status: AC
  Administered 2022-09-12: 1000 mg via ORAL
  Filled 2022-09-12: qty 2

## 2022-09-12 MED ORDER — HYDRALAZINE HCL 50 MG PO TABS
25.0000 mg | ORAL_TABLET | Freq: Once | ORAL | Status: AC
  Administered 2022-09-12: 25 mg via ORAL
  Filled 2022-09-12: qty 1

## 2022-09-12 MED ORDER — HYDRALAZINE HCL 20 MG/ML IJ SOLN
10.0000 mg | Freq: Once | INTRAMUSCULAR | Status: AC
  Administered 2022-09-12: 10 mg via INTRAVENOUS
  Filled 2022-09-12: qty 1

## 2022-09-12 NOTE — ED Notes (Signed)
Pt given warm blanket.

## 2022-09-12 NOTE — ED Provider Notes (Addendum)
[facility] Provider Note    Event Date/Time   First MD Initiated Contact with Patient 09/12/22 1809     (approximate)   History   Chief Complaint: Headache   HPI  Samantha [name] is a 89 y.o. female with a history of atrial fibrillation on Eliquis, hypertension, GERD, prior stroke who comes ED complaining of right parietal headache that has been ongoing for the past 3 days.  Constant, waxing and waning, no aggravating or alleviating factors.  Patient reports that onset feels abrupt like a pop inside of her head.  No vision changes.  No paresthesias motor weakness or change in balance or coordination.  Does report having severe headache episodes in the past but this is the worst headache ever..  No fever, no trauma.     Physical Exam   Triage Vital Signs: ED Triage Vitals  Encounter Vitals Group     BP 09/12/22 1806 (!) 173/114     Systolic BP Percentile --      Diastolic BP Percentile --      Pulse Rate 09/12/22 1806 96     Resp 09/12/22 1806 18     Temp 09/12/22 1806 98.5 F (36.9 C)     Temp Source 09/12/22 1806 Oral     SpO2 09/12/22 1806 95 %     Weight 09/12/22 1803 134 lb 7.7 oz (61 kg)     Height 09/12/22 1803 5' (1.524 m)     Head Circumference --      Peak Flow --      Pain Score 09/12/22 1803 0     Pain Loc --      Pain Education --      Exclude from Growth Chart --     Most recent vital signs: Vitals:   09/12/22 2000 09/12/22 2045  BP: (!) 189/84 (!) 197/83  Pulse: 87   Resp: 16   Temp:    SpO2: 98%     General: Awake, no distress.  CV:  Good peripheral perfusion.  Regular rate and rhythm Resp:  Normal effort.  Clear to auscultation bilaterally Abd:  No distention.  Soft nontender Other:  Cranial nerves III through XII intact.  No drift.  Normal finger-to-nose, stroke scale 0   ED Results / Procedures / Treatments   Labs (all labs ordered are listed, but only abnormal results are displayed) Labs Reviewed  BASIC  METABOLIC PANEL - Abnormal; Notable for the following components:      Result Value   Glucose, Bld 105 (*)    All other components within normal limits  SARS CORONAVIRUS 2 BY RT PCR  CBC WITH DIFFERENTIAL/PLATELET  TSH  TROPONIN I (HIGH SENSITIVITY)     EKG Interpreted by me Sinus rhythm rate of 97.  Normal axis, normal intervals.  Poor R wave progression.  Normal ST segments and T waves.  No ischemic changes.   RADIOLOGY CT head interpreted by me, negative for intracranial hemorrhage or mass.  Radiology report reviewed   PROCEDURES:  .Critical Care  Performed by: Sharman Cheek, MD Authorized by: Sharman Cheek, MD   Critical care provider statement:    Critical care time (minutes):  35   Critical care time was exclusive of:  Separately billable procedures and treating other patients   Critical care was necessary to treat or prevent imminent or life-threatening deterioration of the following conditions:  CNS failure or compromise and circulatory failure   Critical care was time spent personally by  me on the following activities:  Development of treatment plan with patient or surrogate, discussions with consultants, evaluation of patient's response to treatment, examination of patient, obtaining history from patient or surrogate, ordering and performing treatments and interventions, ordering and review of laboratory studies, ordering and review of radiographic studies, pulse oximetry, re-evaluation of patient's condition and review of old charts    MEDICATIONS ORDERED IN ED: Medications  iohexol (OMNIPAQUE) 350 MG/ML injection 75 mL (has no administration in time range)  diphenhydrAMINE (BENADRYL) injection 25 mg (has no administration in time range)  hydrALAZINE (APRESOLINE) tablet 25 mg (25 mg Oral Given 09/12/22 1855)  acetaminophen (TYLENOL) tablet 1,000 mg (1,000 mg Oral Given 09/12/22 1934)  metoCLOPramide (REGLAN) injection 5 mg (5 mg Intravenous Given 09/12/22 1935)   sodium chloride 0.9 % bolus 500 mL (0 mLs Intravenous Stopped 09/12/22 2034)  labetalol (NORMODYNE) injection 10 mg (10 mg Intravenous Given 09/12/22 1935)  hydrALAZINE (APRESOLINE) injection 10 mg (10 mg Intravenous Given 09/12/22 2045)  methylPREDNISolone sodium succinate (SOLU-MEDROL) 125 mg/2 mL injection 60 mg (60 mg Intravenous Given 09/12/22 2109)     IMPRESSION / MDM / ASSESSMENT AND PLAN / ED COURSE  I reviewed the triage vital signs and the nursing notes.  DDx: Intracranial hemorrhage, intracranial mass, cluster headache, cerebral aneurysm, symptomatic hypertension.  Doubt ischemic stroke or meningitis or sinus thrombosis or glaucoma  Patient's presentation is most consistent with acute presentation with potential threat to life or bodily function.  Patient presents with severe headache and uncontrolled hypertension.  Will obtain CT head, labs, give hydralazine, Tylenol, Reglan, labetalol   Clinical Course as of 09/12/22 2221  Mon Sep 12, 2022  2052 Still having severe pain.  Patient reports pain feels like pressure or a bursting sensation in her brain and worst headache she is ever had.  Will proceed with a CT angiogram to look for aneurysm.  Will give additional IV hydralazine for refractory severe hypertension. [PS]  2109 Pt has vague CT contrast allergy. Will start premedication with solumedrol and benadryl [PS]    Clinical Course User Index [PS] Sharman Cheek, MD     FINAL CLINICAL IMPRESSION(S) / ED DIAGNOSES   Final diagnoses:  Bad headache     Rx / DC Orders   ED Discharge Orders     None        Note:  This document was prepared using Dragon voice recognition software and may include unintentional dictation errors.   Sharman Cheek, MD 09/12/22 2128    Sharman Cheek, MD 09/12/22 2128

## 2022-09-12 NOTE — ED Notes (Signed)
Report off to gracie  rn  

## 2022-09-12 NOTE — ED Triage Notes (Addendum)
Pt brought in via ms from home.  Pt has high blood pressure and c/o headache today.  Ems reports cardiac stent.   No chest pain or sob.  Per ems last known well was 3 days ago.  Pt alert.   Iv in place  fsbs 97 per ems

## 2022-09-12 NOTE — ED Notes (Signed)
Return from ct scan.

## 2022-09-12 NOTE — ED Notes (Signed)
ED Provider at bedside. 

## 2022-09-12 NOTE — Discharge Instructions (Addendum)
Use Tylenol for pain and fevers.  Up to 1000 mg per dose, up to 4 times per day.  Do not take more than 4000 mg of Tylenol/acetaminophen within 24 hours..  

## 2022-09-13 ENCOUNTER — Telehealth: Payer: Self-pay

## 2022-09-13 ENCOUNTER — Encounter: Payer: Self-pay | Admitting: Radiology

## 2022-09-13 ENCOUNTER — Emergency Department: Payer: Medicare HMO

## 2022-09-13 DIAGNOSIS — I6529 Occlusion and stenosis of unspecified carotid artery: Secondary | ICD-10-CM | POA: Diagnosis not present

## 2022-09-13 DIAGNOSIS — R519 Headache, unspecified: Secondary | ICD-10-CM | POA: Diagnosis not present

## 2022-09-13 DIAGNOSIS — I671 Cerebral aneurysm, nonruptured: Secondary | ICD-10-CM | POA: Diagnosis not present

## 2022-09-13 NOTE — ED Notes (Signed)
ED Provider at bedside. 

## 2022-09-13 NOTE — Telephone Encounter (Signed)
-----   Message from Cristin R sent at 09/13/2022  4:26 PM EDT ----- "Patient received in signout from Dr. Scotty Court pending CTA head after premedication protocols to evaluate for intracranial aneurysm, subarachnoid hemorrhage in the setting of her severe headache.  Her CTA does result with a small 3 mm intracranial MCA aneurysm without signs of rupture or SAH.   I reevaluate the patient and she reports resolution of her headache and feeling much better.  She is appreciative.  We discussed CT results and the possibility of this aneurysm contributing to her symptoms.   We discussed limitation of CTs and my recommendation for lumbar puncture and neurosurgical consultation, but she declines.  She reports that she is almost 89 years old and "about ready to die."  She reports that she would not want any procedures done such as lumbar punctures, intracranial coiling or other interventions.  She acknowledges the possibility of intracranial bleeding, disability and death indicating she would prefer just to go home and get some rest.  She has capacity to make this decision.  We discussed neurosurgical follow-up and ED return precautions.  We discussed Tylenol dosing at home."   Can you review and let me know who to sched with?

## 2022-09-13 NOTE — ED Provider Notes (Signed)
Patient received in signout from Dr. Scotty Court pending CTA head after premedication protocols to evaluate for intracranial aneurysm, subarachnoid hemorrhage in the setting of her severe headache.  Her CTA does result with a small 3 mm intracranial MCA aneurysm without signs of rupture or SAH.  I reevaluate the patient and she reports resolution of her headache and feeling much better.  She is appreciative.  We discussed CT results and the possibility of this aneurysm contributing to her symptoms.  We discussed limitation of CTs and my recommendation for lumbar puncture and neurosurgical consultation, but she declines.  She reports that she is almost 89 years old and "about ready to die."  She reports that she would not want any procedures done such as lumbar punctures, intracranial coiling or other interventions.  She acknowledges the possibility of intracranial bleeding, disability and death indicating she would prefer just to go home and get some rest.  She has capacity to make this decision.  We discussed neurosurgical follow-up and ED return precautions.  We discussed Tylenol dosing at home.  .Critical Care  Performed by: Delton Prairie, MD Authorized by: Delton Prairie, MD   Critical care provider statement:    Critical care time (minutes):  30   Critical care time was exclusive of:  Separately billable procedures and treating other patients   Critical care was necessary to treat or prevent imminent or life-threatening deterioration of the following conditions:  CNS failure or compromise   Critical care was time spent personally by me on the following activities:  Development of treatment plan with patient or surrogate, discussions with consultants, evaluation of patient's response to treatment, examination of patient, ordering and review of laboratory studies, ordering and review of radiographic studies, ordering and performing treatments and interventions, pulse oximetry, re-evaluation of patient's  condition and review of old charts      Delton Prairie, MD 09/13/22 [phone]

## 2022-09-14 ENCOUNTER — Ambulatory Visit: Payer: Self-pay | Admitting: *Deleted

## 2022-09-14 DIAGNOSIS — I4891 Unspecified atrial fibrillation: Secondary | ICD-10-CM | POA: Diagnosis not present

## 2022-09-14 DIAGNOSIS — R079 Chest pain, unspecified: Secondary | ICD-10-CM | POA: Diagnosis not present

## 2022-09-14 DIAGNOSIS — R519 Headache, unspecified: Secondary | ICD-10-CM | POA: Diagnosis not present

## 2022-09-14 DIAGNOSIS — H5711 Ocular pain, right eye: Secondary | ICD-10-CM | POA: Diagnosis not present

## 2022-09-14 NOTE — Telephone Encounter (Signed)
  Chief Complaint: elevated BP with neurological sx per Toney Rakes , caregiver Symptoms: BP 210/116 HR 94 rechecked sitting 197/107 HR 92. Headache, blurred vision. Pain above left eye burning and pulsating.   Frequency: now  Pertinent Negatives: Patient denies chest pain no difficulty breathing Disposition: [x] ED /[] Urgent Care (no appt availability in office) / [] Appointment(In office/virtual)/ []  [place] [facility]/ [] Home Care/ [] Refused Recommended Disposition /[] [facility] [facility]/ []  Follow-up with PCP Additional Notes:   Recommended ED and to call 911 for evaluation. Patient's caregiver reports patient has episodes of elevated BP and goes to ED then sent home. Please advise regarding medication adjustments.     Reason for Disposition  [1] Systolic BP  >= 160 OR Diastolic >= 100 AND [2] cardiac (e.g., breathing difficulty, chest pain) or neurologic symptoms (e.g., new-onset blurred or double vision, unsteady gait)  Answer Assessment - Initial Assessment Questions 1. BLOOD PRESSURE: "What is the blood pressure?" "Did you take at least two measurements 5 minutes apart?"    BP 210/116 HR 94  rechecked 197/107 HR 92 2. ONSET: "When did you take your blood pressure?"     Now  3. HOW: "How did you take your blood pressure?" (e.g., automatic home BP monitor, visiting nurse)     BP at home monitor  4. HISTORY: "Do you have a history of high blood pressure?"     Yes  5. MEDICINES: "Are you taking any medicines for blood pressure?" "Have you missed any doses recently?"     Yes has not missed any doses 6. OTHER SYMPTOMS: "Do you have any symptoms?" (e.g., blurred vision, chest pain, difficulty breathing, headache, weakness)     Headache , blurred vision, burning above left eye. Dizziness  7. PREGNANCY: "Is there any chance you are pregnant?" "When was your last menstrual period?"     na  Protocols used: Blood Pressure - High-A-AH

## 2022-09-14 NOTE — Telephone Encounter (Signed)
Patient along with her neighbor would like an in-person visit with Duwayne Heck.

## 2022-09-14 NOTE — Telephone Encounter (Signed)
Left message to schedule with Danielle.

## 2022-09-15 DIAGNOSIS — R519 Headache, unspecified: Secondary | ICD-10-CM | POA: Diagnosis not present

## 2022-09-15 DIAGNOSIS — Z59819 Housing instability, housed unspecified: Secondary | ICD-10-CM | POA: Diagnosis not present

## 2022-09-15 DIAGNOSIS — Z66 Do not resuscitate: Secondary | ICD-10-CM | POA: Diagnosis not present

## 2022-09-15 DIAGNOSIS — I160 Hypertensive urgency: Secondary | ICD-10-CM | POA: Diagnosis not present

## 2022-09-15 DIAGNOSIS — Z6826 Body mass index (BMI) 26.0-26.9, adult: Secondary | ICD-10-CM | POA: Diagnosis not present

## 2022-09-15 DIAGNOSIS — I4891 Unspecified atrial fibrillation: Secondary | ICD-10-CM | POA: Diagnosis not present

## 2022-09-15 DIAGNOSIS — I2489 Other forms of acute ischemic heart disease: Secondary | ICD-10-CM | POA: Diagnosis not present

## 2022-09-15 DIAGNOSIS — Z609 Problem related to social environment, unspecified: Secondary | ICD-10-CM | POA: Diagnosis not present

## 2022-09-15 DIAGNOSIS — I2510 Atherosclerotic heart disease of native coronary artery without angina pectoris: Secondary | ICD-10-CM | POA: Diagnosis not present

## 2022-09-15 DIAGNOSIS — E440 Moderate protein-calorie malnutrition: Secondary | ICD-10-CM | POA: Diagnosis not present

## 2022-09-15 DIAGNOSIS — Z8673 Personal history of transient ischemic attack (TIA), and cerebral infarction without residual deficits: Secondary | ICD-10-CM | POA: Diagnosis not present

## 2022-09-15 DIAGNOSIS — G4700 Insomnia, unspecified: Secondary | ICD-10-CM | POA: Diagnosis not present

## 2022-09-15 DIAGNOSIS — E785 Hyperlipidemia, unspecified: Secondary | ICD-10-CM | POA: Diagnosis not present

## 2022-09-15 DIAGNOSIS — K219 Gastro-esophageal reflux disease without esophagitis: Secondary | ICD-10-CM | POA: Diagnosis not present

## 2022-09-15 DIAGNOSIS — R54 Age-related physical debility: Secondary | ICD-10-CM | POA: Diagnosis not present

## 2022-09-15 DIAGNOSIS — R7303 Prediabetes: Secondary | ICD-10-CM | POA: Diagnosis not present

## 2022-09-15 DIAGNOSIS — F329 Major depressive disorder, single episode, unspecified: Secondary | ICD-10-CM | POA: Diagnosis not present

## 2022-09-15 DIAGNOSIS — R7989 Other specified abnormal findings of blood chemistry: Secondary | ICD-10-CM | POA: Diagnosis not present

## 2022-09-15 DIAGNOSIS — R112 Nausea with vomiting, unspecified: Secondary | ICD-10-CM | POA: Diagnosis not present

## 2022-09-20 DIAGNOSIS — I160 Hypertensive urgency: Secondary | ICD-10-CM | POA: Diagnosis not present

## 2022-09-20 DIAGNOSIS — R519 Headache, unspecified: Secondary | ICD-10-CM | POA: Insufficient documentation

## 2022-09-20 DIAGNOSIS — I4891 Unspecified atrial fibrillation: Secondary | ICD-10-CM | POA: Diagnosis not present

## 2022-09-20 NOTE — Telephone Encounter (Signed)
Left message to call back  

## 2022-09-21 ENCOUNTER — Telehealth: Payer: Self-pay

## 2022-09-21 ENCOUNTER — Telehealth: Payer: Self-pay | Admitting: Internal Medicine

## 2022-09-21 DIAGNOSIS — I10 Essential (primary) hypertension: Secondary | ICD-10-CM | POA: Diagnosis not present

## 2022-09-21 DIAGNOSIS — I4891 Unspecified atrial fibrillation: Secondary | ICD-10-CM | POA: Diagnosis not present

## 2022-09-21 DIAGNOSIS — K219 Gastro-esophageal reflux disease without esophagitis: Secondary | ICD-10-CM | POA: Diagnosis not present

## 2022-09-21 DIAGNOSIS — F32A Depression, unspecified: Secondary | ICD-10-CM | POA: Diagnosis not present

## 2022-09-21 DIAGNOSIS — E785 Hyperlipidemia, unspecified: Secondary | ICD-10-CM | POA: Diagnosis not present

## 2022-09-21 DIAGNOSIS — R519 Headache, unspecified: Secondary | ICD-10-CM | POA: Diagnosis not present

## 2022-09-21 DIAGNOSIS — R7303 Prediabetes: Secondary | ICD-10-CM | POA: Diagnosis not present

## 2022-09-21 DIAGNOSIS — R54 Age-related physical debility: Secondary | ICD-10-CM | POA: Diagnosis not present

## 2022-09-21 DIAGNOSIS — I2510 Atherosclerotic heart disease of native coronary artery without angina pectoris: Secondary | ICD-10-CM | POA: Diagnosis not present

## 2022-09-21 DIAGNOSIS — E440 Moderate protein-calorie malnutrition: Secondary | ICD-10-CM | POA: Diagnosis not present

## 2022-09-21 NOTE — Transitions of Care (Post Inpatient/ED Visit) (Signed)
   09/21/2022  Name: RAFAEL MCDILL MRN: [id number] DOB: 03-Oct-1928  Today's TOC FU Call Status: Today's TOC FU Call Status:: Unsuccessful Call (1st Attempt) Unsuccessful Call (1st Attempt) Date: 09/21/22  Attempted to reach the patient regarding the most recent Inpatient/ED visit. HIPAA compliant voicemail left on identified voicemail.  Follow Up Plan: Additional outreach attempts will be made to reach the patient to complete the Transitions of Care (Post Inpatient/ED visit) call.   Jodelle Gross RN, BSN, CCM [facility] RN Care Coordinator/ Transitions of Care Direct Dial: [phone]  Fax: [phone]

## 2022-09-21 NOTE — Telephone Encounter (Signed)
Home Health Verbal Orders - Caller/Agency: Loura Halt PT [facility] Callback Number: [phone] secure  Requesting OT/PT/Skilled Nursing/Social Work/Speech Therapy: PT  Frequency:   1w1 2w2 1w2   OT eval has been ordered, however patient has declined.

## 2022-09-21 NOTE — Telephone Encounter (Signed)
Given VO to [employer]

## 2022-09-22 ENCOUNTER — Telehealth: Payer: Self-pay

## 2022-09-22 NOTE — Transitions of Care (Post Inpatient/ED Visit) (Signed)
   09/22/2022  Name: Samantha [name] MRN: [id number] DOB: 02-08-1928  Today's TOC FU Call Status: Today's TOC FU Call Status:: Unsuccessful Call (2nd Attempt) Unsuccessful Call (2nd Attempt) Date: 09/22/22  Attempted to reach the patient regarding the most recent Inpatient/ED visit.  Follow Up Plan: No further outreach attempts will be made at this time. We have been unable to contact the patient.  Jodelle Gross RN, BSN, CCM [facility] RN Care Coordinator/ Transitions of Care Direct Dial: [phone]  Fax: [phone]

## 2022-09-23 ENCOUNTER — Telehealth: Payer: Self-pay

## 2022-09-23 NOTE — Transitions of Care (Post Inpatient/ED Visit) (Signed)
   09/23/2022  Name: Samantha [name] MRN: [id number] DOB: October 02, 1928  Today's TOC FU Call Status: Today's TOC FU Call Status:: Unsuccessful Call (3rd Attempt) Unsuccessful Call (3rd Attempt) Date: 09/23/22  Attempted to reach the patient regarding the most recent Inpatient/ED visit.  Follow Up Plan: No further outreach attempts will be made at this time. We have been unable to contact the patient.  Jodelle Gross RN, BSN, CCM [facility] RN Care Coordinator/ Transitions of Care Direct Dial: [phone]  Fax: [phone]

## 2022-09-29 DIAGNOSIS — I4891 Unspecified atrial fibrillation: Secondary | ICD-10-CM | POA: Diagnosis not present

## 2022-09-30 ENCOUNTER — Telehealth: Payer: Self-pay | Admitting: Internal Medicine

## 2022-09-30 NOTE — Telephone Encounter (Signed)
Noreene Larsson calling from [facility] is calling to report that the pt is missing PT this week. [phone]

## 2022-10-05 ENCOUNTER — Ambulatory Visit: Payer: Self-pay | Admitting: *Deleted

## 2022-10-05 NOTE — Telephone Encounter (Signed)
  Chief Complaint: Hypertension Symptoms: BP today HH visit 188/92. Trending up; last week 166/85 to 192/81 on Sunday. Some increased fatigue Frequency: 1 week Pertinent Negatives: Headache, weakness, blurred vision, no missed doses of meds Disposition: []ED /[]Urgent Care (no appt availability in office) / [x]Appointment(In office/virtual)/ [] [place] [facility]/ []Home Care/ []Refused Recommended Disposition /[][place] [facility]/ [] Follow-up with PCP Additional Notes: Appt secured for tomorrow. Care advise provided, pt verbalizes understanding. Reason for Disposition  Systolic BP  >= 180 OR Diastolic >= 110  Answer Assessment - Initial Assessment Questions 1. BLOOD PRESSURE: "What is the blood pressure?" "Did you take at least two measurements 5 minutes apart?"     18 8/92 2. ONSET: "When did you take your blood pressure?"     Presently 3. HOW: "How did you take your blood pressure?" (e.g., automatic home BP monitor, visiting nurse)     Visiting nurse 4. HISTORY: "Do you have a history of high blood pressure?"     Yes 5. MEDICINES: "Are you taking any medicines for blood pressure?" "Have you missed any doses recently?"     Yes, no missed doses 6. OTHER SYMPTOMS: "Do you have any symptoms?" (e.g., blurred vision, chest pain, difficulty breathing, headache, weakness)     Increased fatigue only  Protocols used: Blood Pressure - High-A-AH

## 2022-10-05 NOTE — Progress Notes (Unsigned)
There were no vitals taken for this visit.   Subjective:    Patient ID: Samantha [name], female    DOB: 1928/09/18, 89 y.o.   MRN: [id number]  HPI: Samantha [name] is a 89 y.o. female  No chief complaint on file.  Hypertension:  -Medications: *** -Patient is compliant with above medications and reports no side effects. -Checking BP at home (average): *** -Highest BP at home: *** -Lowest BP at home: *** -Denies any SOB, CP, vision changes, LE edema or symptoms of hypotension -Diet: *** -Exercise: ***   Relevant past medical, surgical, family and social history reviewed and updated as indicated. Interim medical history since our last visit reviewed. Allergies and medications reviewed and updated.  Review of Systems  Constitutional: Negative for fever or weight change.  Respiratory: Negative for cough and shortness of breath.   Cardiovascular: Negative for chest pain or palpitations.  Gastrointestinal: Negative for abdominal pain, no bowel changes.  Musculoskeletal: Negative for gait problem or joint swelling.  Skin: Negative for rash.  Neurological: Negative for dizziness or headache.  No other specific complaints in a complete review of systems (except as listed in HPI above).      Objective:    There were no vitals taken for this visit.  Wt Readings from Last 3 Encounters:  09/12/22 134 lb 7.7 oz (61 kg)  09/06/22 135 lb 11.2 oz (61.6 kg)  05/31/21 148 lb 11.2 oz (67.4 kg)    Physical Exam  Constitutional: Patient appears well-developed and well-nourished. Obese *** No distress.  HEENT: head atraumatic, normocephalic, pupils equal and reactive to light, ears ***, neck supple, throat within normal limits Cardiovascular: Normal rate, regular rhythm and normal heart sounds.  No murmur heard. No BLE edema. Pulmonary/Chest: Effort normal and breath sounds normal. No respiratory distress. Abdominal: Soft.  There is no tenderness. Psychiatric: Patient has a normal mood  and affect. behavior is normal. Judgment and thought content normal.  Results for orders placed or performed during the hospital encounter of 09/12/22  SARS Coronavirus 2 by RT PCR (hospital order, performed in [facility] hospital lab) *cepheid single result test* Anterior Nasal Swab   Specimen: Anterior Nasal Swab  Result Value Ref Range   SARS Coronavirus 2 by RT PCR NEGATIVE NEGATIVE  Basic metabolic panel  Result Value Ref Range   Sodium 139 135 - 145 mmol/L   Potassium 3.8 3.5 - 5.1 mmol/L   Chloride 103 98 - 111 mmol/L   CO2 27 22 - 32 mmol/L   Glucose, Bld 105 (H) 70 - 99 mg/dL   BUN 8 8 - 23 mg/dL   Creatinine, Ser 4.09 0.44 - 1.00 mg/dL   Calcium 9.1 8.9 - 81.1 mg/dL   GFR, Estimated >91 >47 mL/min   Anion gap 9 5 - 15  CBC with Differential  Result Value Ref Range   WBC 5.2 4.0 - 10.5 K/uL   RBC 4.36 3.87 - 5.11 MIL/uL   Hemoglobin 13.5 12.0 - 15.0 g/dL   HCT 82.9 56.2 - 13.0 %   MCV 96.1 80.0 - 100.0 fL   MCH 31.0 26.0 - 34.0 pg   MCHC 32.2 30.0 - 36.0 g/dL   RDW 86.5 78.4 - 69.6 %   Platelets 232 150 - 400 K/uL   nRBC 0.0 0.0 - 0.2 %   Neutrophils Relative % 46 %   Neutro Abs 2.3 1.7 - 7.7 K/uL   Lymphocytes Relative 43 %   Lymphs Abs 2.2 0.7 -  4.0 K/uL   Monocytes Relative 8 %   Monocytes Absolute 0.4 0.1 - 1.0 K/uL   Eosinophils Relative 3 %   Eosinophils Absolute 0.2 0.0 - 0.5 K/uL   Basophils Relative 0 %   Basophils Absolute 0.0 0.0 - 0.1 K/uL   Immature Granulocytes 0 %   Abs Immature Granulocytes 0.01 0.00 - 0.07 K/uL  TSH  Result Value Ref Range   TSH 2.068 0.350 - 4.500 uIU/mL  Troponin I (High Sensitivity)  Result Value Ref Range   Troponin I (High Sensitivity) 10 <18 ng/L      Assessment & Plan:   Problem List Items Addressed This Visit   None    Follow up plan: No follow-ups on file.

## 2022-10-06 ENCOUNTER — Ambulatory Visit (INDEPENDENT_AMBULATORY_CARE_PROVIDER_SITE_OTHER): Payer: Medicare HMO | Admitting: Nurse Practitioner

## 2022-10-06 ENCOUNTER — Other Ambulatory Visit: Payer: Self-pay

## 2022-10-06 ENCOUNTER — Encounter: Payer: Self-pay | Admitting: Nurse Practitioner

## 2022-10-06 VITALS — BP 140/76 | HR 98 | Temp 98.1°F | Resp 16 | Ht 60.0 in | Wt 137.2 lb

## 2022-10-06 DIAGNOSIS — I10 Essential (primary) hypertension: Secondary | ICD-10-CM | POA: Diagnosis not present

## 2022-10-06 DIAGNOSIS — I4811 Longstanding persistent atrial fibrillation: Secondary | ICD-10-CM

## 2022-10-06 MED ORDER — LISINOPRIL-HYDROCHLOROTHIAZIDE 20-25 MG PO TABS
1.0000 | ORAL_TABLET | Freq: Every day | ORAL | 0 refills | Status: DC
Start: 2022-10-06 — End: 2023-01-04

## 2022-10-06 NOTE — Assessment & Plan Note (Signed)
Patient recently admitted for atrial fibrillation.  Recently started on diltiazem. Recommend reaching out to cardiologist and scheduling appointment.

## 2022-10-06 NOTE — Assessment & Plan Note (Signed)
Patient reports she has been taking her blood pressure medication and continues to have elevated readings. Will increase hydrochlorothiazide to 25 mg daily,  continue other medications. Follow up in 2 weeks. Also encouraged patient to reach out to cardiologist she is over due for follow up appointment.

## 2022-10-10 ENCOUNTER — Ambulatory Visit: Payer: Self-pay

## 2022-10-10 NOTE — Telephone Encounter (Signed)
  Chief Complaint: HA Symptoms: Right sided HA Frequency: ongoing - seen in ED in mid August. Pertinent Negatives: Patient denies change in HA Disposition: [] ED /[] Urgent Care (no appt availability in office) / [] Appointment(In office/virtual)/ []  [place] [facility]/ [] Home Care/ [] Refused Recommended Disposition /[] [place] [facility]/ [x]  Follow-up with PCP Additional Notes: Call from Jakin at [place]. She states that pt continues with HA. Pt states that HA comes and goes rather quickly, and is quite painful. This is the same HA pt was seen for in ED pt has an upcoming appt with [facility]ist for these HA.  Recent BP is 160/72 and 115/95. Pt does not want an OV - she just wanted provider to be advised.    Reason for Disposition  Headache is a chronic symptom (recurrent or ongoing AND present > 4 weeks)  Answer Assessment - Initial Assessment Questions 1. LOCATION: "Where does it hurt?"      Right side above eye and goes towards  2. ONSET: "When did the headache start?" (Minutes, hours or days)      Longtime 3. PATTERN: "Does the pain come and go, or has it been constant since it started?"     Comes and goes 4. SEVERITY: "How bad is the pain?" and "What does it keep you from doing?"  (e.g., Scale 1-10; mild, moderate, or severe)   - MILD (1-3): doesn't interfere with normal activities    - MODERATE (4-7): interferes with normal activities or awakens from sleep    - SEVERE (8-10): excruciating pain, unable to do any normal activities        10/10 when it comes 5. RECURRENT SYMPTOM: "Have you ever had headaches before?" If Yes, ask: "When was the last time?" and "What happened that time?"      yes 6. CAUSE: "What do you think is causing the headache?"     unsure 7. MIGRAINE: "Have you been diagnosed with migraine headaches?" If Yes, ask: "Is this headache similar?"      no 8. HEAD INJURY: "Has there been any recent injury to the head?"      no 9. OTHER SYMPTOMS: "Do you  have any other symptoms?" (fever, stiff neck, eye pain, sore throat, cold symptoms)     BP was 160/72 neighbor took it 115/95.  Protocols used: [facility]

## 2022-10-13 DIAGNOSIS — F32A Depression, unspecified: Secondary | ICD-10-CM | POA: Diagnosis not present

## 2022-10-13 DIAGNOSIS — I4891 Unspecified atrial fibrillation: Secondary | ICD-10-CM | POA: Diagnosis not present

## 2022-10-13 DIAGNOSIS — K219 Gastro-esophageal reflux disease without esophagitis: Secondary | ICD-10-CM | POA: Diagnosis not present

## 2022-10-13 DIAGNOSIS — R0602 Shortness of breath: Secondary | ICD-10-CM | POA: Diagnosis not present

## 2022-10-13 DIAGNOSIS — I2510 Atherosclerotic heart disease of native coronary artery without angina pectoris: Secondary | ICD-10-CM | POA: Diagnosis not present

## 2022-10-13 DIAGNOSIS — E440 Moderate protein-calorie malnutrition: Secondary | ICD-10-CM | POA: Diagnosis not present

## 2022-10-13 DIAGNOSIS — I480 Paroxysmal atrial fibrillation: Secondary | ICD-10-CM | POA: Diagnosis not present

## 2022-10-13 DIAGNOSIS — I671 Cerebral aneurysm, nonruptured: Secondary | ICD-10-CM | POA: Diagnosis not present

## 2022-10-13 DIAGNOSIS — E7800 Pure hypercholesterolemia, unspecified: Secondary | ICD-10-CM | POA: Diagnosis not present

## 2022-10-13 DIAGNOSIS — R7303 Prediabetes: Secondary | ICD-10-CM | POA: Diagnosis not present

## 2022-10-13 DIAGNOSIS — I10 Essential (primary) hypertension: Secondary | ICD-10-CM | POA: Diagnosis not present

## 2022-10-13 DIAGNOSIS — I25118 Atherosclerotic heart disease of native coronary artery with other forms of angina pectoris: Secondary | ICD-10-CM | POA: Diagnosis not present

## 2022-10-13 DIAGNOSIS — Z955 Presence of coronary angioplasty implant and graft: Secondary | ICD-10-CM

## 2022-10-13 DIAGNOSIS — R519 Headache, unspecified: Secondary | ICD-10-CM | POA: Diagnosis not present

## 2022-10-13 DIAGNOSIS — R54 Age-related physical debility: Secondary | ICD-10-CM | POA: Diagnosis not present

## 2022-10-13 DIAGNOSIS — Z8673 Personal history of transient ischemic attack (TIA), and cerebral infarction without residual deficits: Secondary | ICD-10-CM

## 2022-10-13 DIAGNOSIS — E785 Hyperlipidemia, unspecified: Secondary | ICD-10-CM | POA: Diagnosis not present

## 2022-10-13 DIAGNOSIS — R000 Tachycardia, unspecified: Secondary | ICD-10-CM | POA: Diagnosis not present

## 2022-10-14 NOTE — Progress Notes (Unsigned)
Referring Physician:  Delton Prairie, MD [address] [place],  [place] [address]  Primary Physician:  Margarita Mail, DO  History of Present Illness: 10/18/2022 [name] has a history of afib, HTN, CAD, GERD, and osteoporosis.  Seen in ED on 09/12/22 for severe headache that started after feeling a pop inside her head.   CTA showed  3 mm left MCA bifurcation aneurysm along with a 3. 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  Headache improved in ED and she declined LP and neurosurgery consult in ED.   She is here for follow up.   She continues with intermittent headaches- she had one this morning. She feels a shocking pain in the right side over her eye and goes over her ear. It can last for a few seconds to a minute or two. Pain feels pulsating in nature. She thinks this happens daily in the last 6 weeks. She feels like her vision is fuzzy today. She does not have dizziness when she gets the pain, but she feels dizzy today.   She is taking ELIQUIS.   She does not smoke.    Review of Systems:  A 10 point review of systems is negative, except for the pertinent positives and negatives detailed in the HPI.  Past Medical History: Past Medical History:  Diagnosis Date   A-fib (HCC) 06/07/2014   Arm fracture 03/10/2017   right   CAD (coronary artery disease) 06/07/2014   Closed right radial fracture 03/21/2017   Depression 09/24/2015   Dyslipidemia 08/16/2016   Dysphonia 08/16/2016   Hypertension    Mild aortic stenosis 08/22/2016   Echo July 2018   Mitral regurgitation 08/22/2016   Echo July 2018   Stroke [facility])    Vertigo    Wears hearing aid in right ear     Past Surgical History: Past Surgical History:  Procedure Laterality Date   ABDOMINAL HYSTERECTOMY     APPENDECTOMY     CHOLECYSTECTOMY     FRACTURE  SURGERY      Allergies: Allergies as of 10/18/2022 - Review Complete 10/18/2022  Allergen Reaction Noted   Iodine Nausea And Vomiting 10/21/2011   Levaquin [levofloxacin] Other (See Comments) 06/07/2014    Medications: Outpatient Encounter Medications as of 10/18/2022  Medication Sig   apixaban (ELIQUIS) 5 MG TABS tablet Take 1 tablet (5 mg total) by mouth 2 (two) times daily.   atorvastatin (LIPITOR) 80 MG tablet Take 1 tablet (80 mg total) by mouth at bedtime.   Cholecalciferol 25 MCG (1000 UT) tablet Take 1,000 Units by mouth daily.   diltiazem (CARDIZEM CD) 300 MG 24 hr capsule Take 1 capsule by mouth daily.   FLUoxetine (PROZAC) 40 MG capsule Take 1 capsule (40 mg total) by mouth daily.   isosorbide mononitrate (IMDUR) 60 MG 24 hr tablet Take 1 tablet (60 mg total) by mouth daily.   lisinopril-hydrochlorothiazide (ZESTORETIC) 20-25 MG tablet Take 1 tablet by mouth daily.   metoprolol succinate (TOPROL-XL) 50 MG 24 hr tablet Take 1 tablet (50 mg total) by mouth daily. Take with or immediately following a meal.   Multiple Vitamin (MULTIVITAMIN) tablet Take 1 tablet by mouth daily.   No facility-administered encounter medications on file as of 10/18/2022.    Social History: Social History   Tobacco Use   Smoking status: Never   Smokeless tobacco: Never  Vaping Use  Vaping status: Never Used  Substance Use Topics   Alcohol use: No   Drug use: No    Family Medical History: Family History  Problem Relation Age of Onset   Stroke Mother    Heart attack Father    Heart disease Sister    Diabetes Sister    Heart disease Brother    Heart attack Brother    Diabetes Maternal Grandfather    Ovarian cancer Paternal Grandmother    Cancer Brother        bladder cancer   Diabetes Brother    Heart disease Brother    Hypertension Brother    Heart attack Daughter     Physical Examination: Vitals:   10/18/22 1314  BP: 120/68    General: Patient is well developed, well  nourished, calm, collected, and in no apparent distress. Attention to examination is appropriate.  Respiratory: Patient is breathing without any difficulty.   NEUROLOGICAL:     Awake, alert, oriented to person, place, and time.  Speech is clear and fluent. Fund of knowledge is appropriate.   Cranial Nerves: Pupils equal round and reactive to light.  Facial tone is symmetric.    CRANIAL NERVES: Visual acuity and visual fields are grossly intact  Extraocular muscles are intact  Facial sensation is intact bilaterally  Facial strength is intact bilaterally  Hearing is intact bilaterally  Palate elevates midline, normal phonation  Shoulder shrug strength is intact  Tongue protrudes midline    No abnormal lesions on exposed skin.   Strength: Side Biceps Triceps Deltoid Interossei Grip Wrist Ext. Wrist Flex.  R 5 5 5 5 5 5 5   L 5 5 5 5 5 5 5    Side Iliopsoas Quads Hamstring PF DF EHL  R 5 5 5 5 5 5   L 5 5 5 5 5 5    Reflexes are 2+ and symmetric at the biceps, brachioradialis, patella and achilles.   Hoffman's is absent.  Clonus is not present.   Bilateral upper and lower extremity sensation is intact to light touch.     She ambulates with a cane.   Medical Decision Making  Imaging: CTA head dated 09/13/22:  FINDINGS: CTA HEAD   Anterior circulation: Mild atheromatous change seen about the carotid siphons without hemodynamically significant stenosis. 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 A1 segments patent bilaterally. Normal anterior communicating complex. Anterior cerebral arteries patent without stenosis. No M1 stenosis or occlusion. 3 mm aneurysm seen arising from the left MCA bifurcation (series 6, image 110). No proximal MCA branch occlusion or high-grade stenosis. Distal MCA branches perfused and symmetric.    Posterior circulation: Left vertebral artery dominant. Mild atheromatous change within the left V4 segment without significant stenosis. Right vertebral artery widely patent. Both PICA patent. Basilar widely patent without stenosis. Superior cerebellar and posterior cerebral arteries patent bilaterally.   Venous sinuses: Patent allowing for timing the contrast bolus.   Anatomic variants: Dominant left vertebral artery.   Review of the MIP images confirms the above findings.   IMPRESSION: 1. Negative CTA for large vessel occlusion or other emergent finding. 2. 3 mm left MCA bifurcation aneurysm. 3. 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     Electronically Signed   By: Janell Quiet.D.  On: 09/13/2022 02:39   CT of head dated 09/14/22:  FINDINGS:   Scout image(s): No acute abnormality.   Parenchyma and extra-axial spaces: There is no evidence of acute intracranial hemorrhage. No edema, mass effect, or midline shift. The basilar cisterns are patent. Hypoattenuation in the deep and subcortical white matter reflects sequelae of senescent small vessel ischemic disease.   Ventricular system: Enlargement of ventricles and subarachnoid spaces reflects parenchymal volume loss.   Other: Intraocular lens replacements are noted. Postsurgical changes related to prior left mastoidectomy. There is no depressed skull fracture.    IMPRESSION  No acute intracranial abnormality. Chronic findings as noted above.     I have personally reviewed the images and agree with the above interpretation.  Assessment and Plan: Ms. Santana is a pleasant 89 y.o. female was seen in ED on 09/12/22 for severe headache.   CTA showed  3 mm left MCA bifurcation aneurysm along with a 3. 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  For the last 6 weeks, she has intermittent daily headaches- she feels a shocking pain in the right side over her eye that goes over her ear. It can last for a few seconds to a minute or two. Pain feels pulsating in nature. She feels like her vision is fuzzy today. She does not have dizziness when she gets the pain, but she feels dizzy today.   Patient reviewed with Dr. Myer Haff along with treatment options. Following plan made with patient and her friend Samantha [name]:    - CT scan of head without contrast due to persistent symptoms.  - Once I have CT results, I will review with Dr. Myer Haff and we will call her friend Samantha [name] with results. This is okay per patient. Samantha [name]'s number is in the chart.  - May need to have her come back in to discuss further treatment options for aneurysms as well after CT is done. She wants to avoid any surgery.  - May also consider referral to neurology for headaches if CT looks okay.   I spent a total of 35 minutes in face-to-face and non-face-to-face activities related to this patient's care today including review of outside records, review of imaging, review of symptoms, physical exam, discussion of differential diagnosis, discussion of treatment options, and documentation.   Thank you for involving me in the care of this patient.   Drake Leach PA-C Dept. of Neurosurgery

## 2022-10-17 ENCOUNTER — Other Ambulatory Visit: Payer: Self-pay | Admitting: Family Medicine

## 2022-10-17 ENCOUNTER — Telehealth: Payer: Self-pay | Admitting: Internal Medicine

## 2022-10-17 ENCOUNTER — Inpatient Hospital Stay
Admission: RE | Admit: 2022-10-17 | Discharge: 2022-10-17 | Disposition: A | Discharge status: Home / Self Care | Payer: Self-pay | Source: Ambulatory Visit | Attending: Orthopedic Surgery | Admitting: Orthopedic Surgery

## 2022-10-17 DIAGNOSIS — Z049 Encounter for examination and observation for unspecified reason: Secondary | ICD-10-CM

## 2022-10-17 NOTE — Telephone Encounter (Signed)
Home Health Verbal Orders - Caller/Agency: Stacy with [facility]  Callback Number: [phone] (it is secure, can LVM)  Requesting to extend PT  Frequency: 1 week 4

## 2022-10-18 ENCOUNTER — Encounter: Payer: Self-pay | Admitting: Orthopedic Surgery

## 2022-10-18 ENCOUNTER — Ambulatory Visit: Payer: Medicare HMO | Admitting: Orthopedic Surgery

## 2022-10-18 VITALS — BP 120/68 | Ht 60.0 in | Wt 135.2 lb

## 2022-10-18 DIAGNOSIS — I671 Cerebral aneurysm, nonruptured: Secondary | ICD-10-CM

## 2022-10-18 DIAGNOSIS — I729 Aneurysm of unspecified site: Secondary | ICD-10-CM

## 2022-10-18 NOTE — Patient Instructions (Signed)
It was so nice to see you today. Thank you so much for coming in.    Dr. Myer Haff wants you to get a CT of your head. We will get this approved through your insurance and [facility] will call you to schedule the appointment.   After you have the CT, it takes 10-14 days for me to get the results back. Once I have them, we will call your friend Lu to discuss them.    [facility] (building with the white pillars) is located off of [place]. The address is [address] [place], [place] [address].   Please do not hesitate to call if you have any questions or concerns. You can also message me in MyChart.   Drake Leach PA-C [phone]

## 2022-10-27 DIAGNOSIS — I10 Essential (primary) hypertension: Secondary | ICD-10-CM | POA: Diagnosis not present

## 2022-10-27 DIAGNOSIS — E7800 Pure hypercholesterolemia, unspecified: Secondary | ICD-10-CM | POA: Diagnosis not present

## 2022-10-27 DIAGNOSIS — I671 Cerebral aneurysm, nonruptured: Secondary | ICD-10-CM | POA: Diagnosis not present

## 2022-10-27 DIAGNOSIS — I480 Paroxysmal atrial fibrillation: Secondary | ICD-10-CM | POA: Diagnosis not present

## 2022-10-27 DIAGNOSIS — I2510 Atherosclerotic heart disease of native coronary artery without angina pectoris: Secondary | ICD-10-CM | POA: Diagnosis not present

## 2022-10-27 DIAGNOSIS — R0602 Shortness of breath: Secondary | ICD-10-CM | POA: Diagnosis not present

## 2022-11-01 ENCOUNTER — Ambulatory Visit
Admission: RE | Admit: 2022-11-01 | Discharge: 2022-11-01 | Disposition: A | Discharge status: Home / Self Care | Payer: Medicare HMO | Source: Ambulatory Visit | Attending: Orthopedic Surgery | Admitting: Orthopedic Surgery

## 2022-11-01 DIAGNOSIS — Z136 Encounter for screening for cardiovascular disorders: Secondary | ICD-10-CM | POA: Diagnosis not present

## 2022-11-01 DIAGNOSIS — I6782 Cerebral ischemia: Secondary | ICD-10-CM | POA: Diagnosis not present

## 2022-11-01 DIAGNOSIS — H748X2 Other specified disorders of left middle ear and mastoid: Secondary | ICD-10-CM | POA: Diagnosis not present

## 2022-11-01 DIAGNOSIS — I671 Cerebral aneurysm, nonruptured: Secondary | ICD-10-CM | POA: Diagnosis not present

## 2022-11-01 DIAGNOSIS — I729 Aneurysm of unspecified site: Secondary | ICD-10-CM | POA: Diagnosis not present

## 2022-11-07 DIAGNOSIS — R000 Tachycardia, unspecified: Secondary | ICD-10-CM | POA: Diagnosis not present

## 2022-11-07 DIAGNOSIS — I4891 Unspecified atrial fibrillation: Secondary | ICD-10-CM | POA: Diagnosis not present

## 2022-11-07 NOTE — Progress Notes (Unsigned)
Established Patient Office Visit  Subjective:  Patient ID: Samantha [name], female    DOB: 08-31-1928  Age: 89 y.o. MRN: [id number]  CC:  No chief complaint on file.   HPI Samantha [name] presents for follow up.  She is here with her friend and neighbor.  Patient states she has been under a lot of stress lately due to issues with her landlord.  She is been living in the same duplex for over 20 years, however she has had a new landlord for the past 2 or 3 years.  He is trying to increase her rent and making her plan out the duplex and move some of her things to storage.  Overall her encounters with him have been unpleasant causing her stress and anxiety to be uncontrolled.  She is having difficulty sleeping.  She continues to complain of severe left-sided head pain despite having a complete workup negative in 2023 including CT of the head.  Today she is also complaining of acute chest pain.   Hypertension/PAF: -Medications: Lisinopril-HCTZ 20-12.5, Metoprolol 50, Imdur 60, Eliquis 5 BID -Patient is compliant with above medications and reports no side effects. -Checking BP at home (average): doesn't check -Denies any SOB, CP, vision changes, LE edema or symptoms of hypotension   HLD: -Medications: Lipitor 80 -Patient is compliant with above medications and reports no side effects.  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 -Referral to ENT/audiology placed previously   Past Medical History:  Diagnosis Date   A-fib (HCC) 06/07/2014   Arm fracture 03/10/2017   right   CAD (coronary artery disease) 06/07/2014    Closed right radial fracture 03/21/2017   Depression 09/24/2015   Dyslipidemia 08/16/2016   Dysphonia 08/16/2016   Hypertension    Mild aortic stenosis 08/22/2016   Echo July 2018   Mitral regurgitation 08/22/2016   Echo July 2018   Stroke [facility])    Vertigo    Wears hearing aid in right ear     Past Surgical History:  Procedure Laterality Date   ABDOMINAL HYSTERECTOMY     APPENDECTOMY     CHOLECYSTECTOMY     FRACTURE SURGERY      Family History  Problem Relation Age of Onset   Stroke Mother    Heart attack Father    Heart disease Sister    Diabetes Sister    Heart disease Brother    Heart attack Brother    Diabetes Maternal Grandfather    Ovarian cancer Paternal Grandmother    Cancer Brother        bladder cancer   Diabetes Brother    Heart disease Brother    Hypertension Brother    Heart attack Daughter     Social History   Socioeconomic History   Marital status: Widowed    Spouse name: Not on file   Number of children: 3   Years of education: Not on file   Highest education level: Not on file  Occupational History   Not on file  Tobacco Use   Smoking status: Never   Smokeless tobacco: Never  Vaping Use  Vaping status: Never Used  Substance and Sexual Activity   Alcohol use: No   Drug use: No   Sexual activity: Never  Other Topics Concern   Not on file  Social History Narrative   Only has 1 living child left   Social Determinants of Health   Financial Resource Strain: Low Risk  (10/22/2020)   Overall Financial Resource Strain (CARDIA)    Difficulty of Paying Living Expenses: Not very hard  Food Insecurity: No Food Insecurity (10/22/2020)   Hunger Vital Sign    Worried About Running Out of Food in the Last Year: Never true    Ran Out of Food in the Last Year: Never true  Transportation Needs: Unmet Transportation Needs (08/25/2022)   PRAPARE - Transportation    Lack of Transportation (Medical): Yes    Lack of Transportation (Non-Medical): Yes   Physical Activity: Inactive (10/22/2020)   Exercise Vital Sign    Days of Exercise per Week: 0 days    Minutes of Exercise per Session: 0 min  Stress: No Stress Concern Present (10/22/2020)   [employer] of Occupational Health - Occupational Stress Questionnaire    Feeling of Stress : Not at all  Social Connections: Moderately Isolated (10/22/2020)   Social Connection and Isolation Panel [NHANES]    Frequency of Communication with Friends and Family: More than three times a week    Frequency of Social Gatherings with Friends and Family: Three times a week    Attends Religious Services: More than 4 times per year    Active Member of Clubs or Organizations: No    Attends Banker Meetings: Never    Marital Status: Widowed  Intimate Partner Violence: Not At Risk (10/22/2020)   Humiliation, Afraid, Rape, and Kick questionnaire    Fear of Current or Ex-Partner: No    Emotionally Abused: No    Physically Abused: No    Sexually Abused: No    Outpatient Medications Prior to Visit  Medication Sig Dispense Refill   apixaban (ELIQUIS) 5 MG TABS tablet Take 1 tablet (5 mg total) by mouth 2 (two) times daily. 60 tablet 0   atorvastatin (LIPITOR) 80 MG tablet Take 1 tablet (80 mg total) by mouth at bedtime. 90 tablet 0   Cholecalciferol 25 MCG (1000 UT) tablet Take 1,000 Units by mouth daily.     diltiazem (CARDIZEM CD) 300 MG 24 hr capsule Take 1 capsule by mouth daily.     FLUoxetine (PROZAC) 40 MG capsule Take 1 capsule (40 mg total) by mouth daily. 90 capsule 3   isosorbide mononitrate (IMDUR) 60 MG 24 hr tablet Take 1 tablet (60 mg total) by mouth daily. 90 tablet 0   lisinopril-hydrochlorothiazide (ZESTORETIC) 20-25 MG tablet Take 1 tablet by mouth daily. 90 tablet 0   metoprolol succinate (TOPROL-XL) 50 MG 24 hr tablet Take 1 tablet (50 mg total) by mouth daily. Take with or immediately following a meal. 90 tablet 0   Multiple Vitamin (MULTIVITAMIN) tablet Take 1 tablet by  mouth daily.     No facility-administered medications prior to visit.    Allergies  Allergen Reactions   Iodine Nausea And Vomiting   Levaquin [Levofloxacin] Other (See Comments)    Pt states she have nose bleeds and pain in arms and shoulders.    ROS Review of Systems  Constitutional:  Negative for activity change, appetite change, chills and fever.  HENT:  Positive for hearing loss.   Eyes:  Negative for visual disturbance.  Respiratory:  Negative for cough and shortness of breath.   Cardiovascular:  Positive for chest pain.  Gastrointestinal:  Negative for abdominal pain.  Neurological:  Positive for headaches. Negative for dizziness.  Psychiatric/Behavioral:  Positive for sleep disturbance. The patient is nervous/anxious.       Objective:    Physical Exam Constitutional:      Appearance: Normal appearance.  HENT:     Head: Normocephalic and atraumatic.  Eyes:     Conjunctiva/sclera: Conjunctivae normal.  Cardiovascular:     Rate and Rhythm: Normal rate and regular rhythm.  Pulmonary:     Effort: Pulmonary effort is normal.     Breath sounds: Normal breath sounds.  Skin:    General: Skin is warm and dry.  Neurological:     General: No focal deficit present.     Mental Status: She is alert. Mental status is at baseline.  Psychiatric:        Mood and Affect: Mood normal.        Behavior: Behavior normal.     There were no vitals taken for this visit. Wt Readings from Last 3 Encounters:  10/18/22 135 lb 3.2 oz (61.3 kg)  10/06/22 137 lb 3.2 oz (62.2 kg)  09/12/22 134 lb 7.7 oz (61 kg)     Health Maintenance Due  Topic Date Due   Medicare Annual Wellness (AWV)  10/22/2021   INFLUENZA VACCINE  09/01/2022   COVID-19 Vaccine (4 - 2023-24 season) 10/02/2022    There are no preventive care reminders to display for this patient.  Lab Results  Component Value Date   TSH 2.068 09/12/2022   Lab Results  Component Value Date   WBC 5.2 09/12/2022   HGB  13.5 09/12/2022   HCT 41.9 09/12/2022   MCV 96.1 09/12/2022   PLT 232 09/12/2022   Lab Results  Component Value Date   NA 139 09/12/2022   K 3.8 09/12/2022   CO2 27 09/12/2022   GLUCOSE 105 (H) 09/12/2022   BUN 8 09/12/2022   CREATININE 0.70 09/12/2022   BILITOT 0.5 09/06/2022   ALKPHOS 104 02/14/2021   AST 26 09/06/2022   ALT 20 09/06/2022   PROT 6.5 09/06/2022   ALBUMIN 3.4 (L) 02/14/2021   CALCIUM 9.1 09/12/2022   ANIONGAP 9 09/12/2022   EGFR 78 09/06/2022   Lab Results  Component Value Date   CHOL 179 06/23/2020   Lab Results  Component Value Date   HDL 35 (L) 06/23/2020   Lab Results  Component Value Date   LDLCALC 94 06/23/2020   Lab Results  Component Value Date   TRIG 377 (H) 06/23/2020   Lab Results  Component Value Date   CHOLHDL 5.1 (H) 06/23/2020   Lab Results  Component Value Date   HGBA1C 5.7 (H) 05/08/2019      Assessment & Plan:   1. Moderate episode of recurrent major depressive disorder (HCC)/Housing insecurity/Chest pain, unspecified type: Patient's depression and anxiety exacerbated due to issues with her landlord.  Will increase Prozac to 40 mg and place a referral to social work to see if there are any recommendations in terms of housing for the patient.  Patient briefly started looking into retirement homes, this could be a good option for her.  I do think a lot of her symptoms are psychosomatic and related to her stress.  EKG in the office normal, however blood pressure elevated the more we discussed the issues with her landlord.  Workup for head pain last year was  completely negative, pain seems to be worsened with acute stress and anxiety.  Will have her follow-up in 6 to 8 weeks to see how she is doing with the increase in medication. Labs due today.  - AMB Referral to [facility] ([facility]) - FLUoxetine (PROZAC) 40 MG capsule; Take 1 capsule (40 mg total) by mouth daily.  Dispense: 90 capsule; Refill: 3 - CBC  w/Diff/Platelet - COMPLETE METABOLIC PANEL WITH GFR - EKG 16-XWRU   Follow-up: No follow-ups on file.    Margarita Mail, DO

## 2022-11-08 ENCOUNTER — Encounter: Payer: Self-pay | Admitting: Internal Medicine

## 2022-11-08 ENCOUNTER — Ambulatory Visit (INDEPENDENT_AMBULATORY_CARE_PROVIDER_SITE_OTHER): Payer: Medicare HMO | Admitting: Internal Medicine

## 2022-11-08 VITALS — BP 118/68 | HR 93 | Temp 98.1°F | Resp 16 | Ht 60.0 in | Wt 133.3 lb

## 2022-11-08 DIAGNOSIS — I4891 Unspecified atrial fibrillation: Secondary | ICD-10-CM

## 2022-11-08 DIAGNOSIS — I10 Essential (primary) hypertension: Secondary | ICD-10-CM

## 2022-11-08 DIAGNOSIS — R42 Dizziness and giddiness: Secondary | ICD-10-CM | POA: Diagnosis not present

## 2022-11-08 NOTE — Patient Instructions (Signed)
It was great seeing you today!  Plan discussed at today's visit: -Continue to monitor blood pressure at home, recommend calling Cardiology and potentially decreasing beta blocker -Perfect blood pressure 120/80, do not want BP <105/70   Follow up in: 6 months   Take care and let us know if you have any questions or concerns prior to your next visit.  Dr. Caralee Ates

## 2022-11-10 ENCOUNTER — Telehealth: Payer: Self-pay | Admitting: Internal Medicine

## 2022-11-10 NOTE — Telephone Encounter (Signed)
Home Health Verbal Orders - Caller/Agency: Noreene Larsson from [facility] Number: [phone]  Requesting OT/PT/Skilled Nursing/Social Work/Speech Therapy: PT  Frequency:   Missed her visits this week because patient refused.

## 2022-11-17 ENCOUNTER — Encounter: Payer: Self-pay | Admitting: Orthopedic Surgery

## 2022-11-17 NOTE — Progress Notes (Addendum)
CT of head dated 11/01/22:  FINDINGS: Brain: No hemorrhage. No hydrocephalus. No extra-axial fluid collection. No mass effect. No mass lesion. There is sequela of moderate chronic microvascular ischemic change. No CT evidence of an acute cortical infarct.   Vascular: No hyperdense vessel or unexpected calcification.   Skull: Normal. Negative for fracture or focal lesion.   Sinuses/Orbits: Prior wall down left-sided mastoidectomy. There is abnormal soft tissue in the left middle ear cavity surrounding the ossicles (series 6, image 38). Polypoid mucosal thickening in the left sphenoid sinus. Bilateral lens replacement. Orbits are otherwise unremarkable.   Other: None.   IMPRESSION: 1. No acute intracranial abnormality. 2. Prior wall down left-sided mastoidectomy. Abnormal soft tissue in the left middle ear cavity surrounding the ossicles. Recommend correlation with direct visualization.     Electronically Signed   By: Lorenza Cambridge M.D.   On: 11/15/2022 09:11  Above CT scan reviewed with Dr. Myer Haff.   Recommend she follow up with ENT regarding left ear.   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  Dr. Myer Haff recommends that she follow up with vascular neurosurgery at [facility] to discuss treatment options for this Vic Blackbird MD).   I called Toney Rakes (per patient's request) to discuss above and left her a VM to call me back.   Toney Rakes called me back. We discussed above findings.   - Will send CT results to her ENT Jenne Campus) and she will f/u with him.  - Lu will discuss recommendation for her to see vascular neurosurgery Vic Blackbird) and let me know if she wants referral done.  - Lu states she is still having headaches. Dr. Myer Haff did not think these  were from the aneurysm. Recommend referral to neurology. Lu will discuss with patient and let me know.  - I did type up summary of what we discussed and will mail to patient.

## 2022-11-29 DIAGNOSIS — H7012 Chronic mastoiditis, left ear: Secondary | ICD-10-CM | POA: Diagnosis not present

## 2022-11-29 DIAGNOSIS — H903 Sensorineural hearing loss, bilateral: Secondary | ICD-10-CM | POA: Diagnosis not present

## 2023-01-02 ENCOUNTER — Other Ambulatory Visit: Payer: Self-pay | Admitting: Nurse Practitioner

## 2023-01-02 DIAGNOSIS — I10 Essential (primary) hypertension: Secondary | ICD-10-CM

## 2023-01-04 NOTE — Telephone Encounter (Signed)
Requested Prescriptions  Pending Prescriptions Disp Refills   lisinopril-hydrochlorothiazide (ZESTORETIC) 20-25 MG tablet [Pharmacy Med Name: LISINOPRIL-HCTZ 20-25 MG TAB] 90 tablet 0    Sig: Take 1 tablet by mouth daily.     Cardiovascular:  ACEI + Diuretic Combos Passed - 01/02/2023  7:32 AM      Passed - Na in normal range and within 180 days    Sodium  Date Value Ref Range Status  09/12/2022 139 135 - 145 mmol/L Final  04/04/2013 138 136 - 145 mmol/L Final         Passed - K in normal range and within 180 days    Potassium  Date Value Ref Range Status  09/12/2022 3.8 3.5 - 5.1 mmol/L Final  04/04/2013 4.3 3.5 - 5.1 mmol/L Final         Passed - Cr in normal range and within 180 days    Creat  Date Value Ref Range Status  09/06/2022 0.73 0.60 - 0.95 mg/dL Final   Creatinine, Ser  Date Value Ref Range Status  09/12/2022 0.70 0.44 - 1.00 mg/dL Final         Passed - eGFR is 30 or above and within 180 days    GFR, Est African American  Date Value Ref Range Status  05/08/2019 71 > OR = 60 mL/min/1.77m2 Final   GFR, Est Non African American  Date Value Ref Range Status  05/08/2019 61 > OR = 60 mL/min/1.29m2 Final   GFR, Estimated  Date Value Ref Range Status  09/12/2022 >60 >60 mL/min Final    Comment:    (NOTE) Calculated using the CKD-EPI Creatinine Equation (2021)    eGFR  Date Value Ref Range Status  09/06/2022 78 > OR = 60 mL/min/1.46m2 Final         Passed - Patient is not pregnant      Passed - Last BP in normal range    BP Readings from Last 1 Encounters:  11/08/22 118/68         Passed - Valid encounter within last 6 months    Recent Outpatient Visits           1 month ago Essential hypertension   [facility] Margarita Mail, DO   3 months ago Essential hypertension   [facility] Berniece Salines, FNP   4 months ago Moderate episode of recurrent major depressive disorder [facility])   [facility] Margarita Mail, DO   1 year ago Other fatigue   [facility] Margarita Mail, DO   1 year ago Ambulatory dysfunction   [facility] Margarita Mail, DO       Future Appointments             In 1 month Evelene Croon, Atilano Median, MD [facility]   In 4 months Margarita Mail, DO [facility]

## 2023-01-21 ENCOUNTER — Emergency Department: Payer: Medicare HMO

## 2023-01-21 ENCOUNTER — Inpatient Hospital Stay
Admission: EM | Admit: 2023-01-21 | Discharge: 2023-01-23 | DRG: 156 | Disposition: A | Discharge status: Home / Self Care | Payer: Medicare HMO | Attending: Internal Medicine | Admitting: Internal Medicine

## 2023-01-21 ENCOUNTER — Other Ambulatory Visit: Payer: Self-pay

## 2023-01-21 DIAGNOSIS — I4891 Unspecified atrial fibrillation: Secondary | ICD-10-CM | POA: Diagnosis present

## 2023-01-21 DIAGNOSIS — R0602 Shortness of breath: Secondary | ICD-10-CM | POA: Diagnosis not present

## 2023-01-21 DIAGNOSIS — I480 Paroxysmal atrial fibrillation: Secondary | ICD-10-CM | POA: Diagnosis not present

## 2023-01-21 DIAGNOSIS — Z9889 Other specified postprocedural states: Secondary | ICD-10-CM

## 2023-01-21 DIAGNOSIS — R0603 Acute respiratory distress: Secondary | ICD-10-CM | POA: Diagnosis not present

## 2023-01-21 DIAGNOSIS — Z8052 Family history of malignant neoplasm of bladder: Secondary | ICD-10-CM

## 2023-01-21 DIAGNOSIS — I10 Essential (primary) hypertension: Secondary | ICD-10-CM | POA: Diagnosis present

## 2023-01-21 DIAGNOSIS — Z888 Allergy status to other drugs, medicaments and biological substances status: Secondary | ICD-10-CM

## 2023-01-21 DIAGNOSIS — Z955 Presence of coronary angioplasty implant and graft: Secondary | ICD-10-CM

## 2023-01-21 DIAGNOSIS — Z90710 Acquired absence of both cervix and uterus: Secondary | ICD-10-CM

## 2023-01-21 DIAGNOSIS — R0902 Hypoxemia: Secondary | ICD-10-CM | POA: Diagnosis not present

## 2023-01-21 DIAGNOSIS — I340 Nonrheumatic mitral (valve) insufficiency: Secondary | ICD-10-CM | POA: Diagnosis present

## 2023-01-21 DIAGNOSIS — J386 Stenosis of larynx: Principal | ICD-10-CM | POA: Diagnosis present

## 2023-01-21 DIAGNOSIS — E785 Hyperlipidemia, unspecified: Secondary | ICD-10-CM | POA: Diagnosis present

## 2023-01-21 DIAGNOSIS — R061 Stridor: Secondary | ICD-10-CM | POA: Diagnosis not present

## 2023-01-21 DIAGNOSIS — I25118 Atherosclerotic heart disease of native coronary artery with other forms of angina pectoris: Secondary | ICD-10-CM | POA: Diagnosis not present

## 2023-01-21 DIAGNOSIS — J040 Acute laryngitis: Secondary | ICD-10-CM | POA: Diagnosis present

## 2023-01-21 DIAGNOSIS — Z79899 Other long term (current) drug therapy: Secondary | ICD-10-CM

## 2023-01-21 DIAGNOSIS — R1310 Dysphagia, unspecified: Secondary | ICD-10-CM | POA: Diagnosis present

## 2023-01-21 DIAGNOSIS — Z9089 Acquired absence of other organs: Secondary | ICD-10-CM

## 2023-01-21 DIAGNOSIS — R0689 Other abnormalities of breathing: Secondary | ICD-10-CM | POA: Diagnosis not present

## 2023-01-21 DIAGNOSIS — R069 Unspecified abnormalities of breathing: Secondary | ICD-10-CM | POA: Diagnosis not present

## 2023-01-21 DIAGNOSIS — Z7901 Long term (current) use of anticoagulants: Secondary | ICD-10-CM

## 2023-01-21 DIAGNOSIS — Z9049 Acquired absence of other specified parts of digestive tract: Secondary | ICD-10-CM

## 2023-01-21 DIAGNOSIS — Z8673 Personal history of transient ischemic attack (TIA), and cerebral infarction without residual deficits: Secondary | ICD-10-CM

## 2023-01-21 DIAGNOSIS — Z8249 Family history of ischemic heart disease and other diseases of the circulatory system: Secondary | ICD-10-CM

## 2023-01-21 DIAGNOSIS — I350 Nonrheumatic aortic (valve) stenosis: Secondary | ICD-10-CM | POA: Diagnosis present

## 2023-01-21 DIAGNOSIS — Z743 Need for continuous supervision: Secondary | ICD-10-CM | POA: Diagnosis not present

## 2023-01-21 DIAGNOSIS — B9789 Other viral agents as the cause of diseases classified elsewhere: Secondary | ICD-10-CM | POA: Diagnosis present

## 2023-01-21 DIAGNOSIS — E669 Obesity, unspecified: Secondary | ICD-10-CM | POA: Insufficient documentation

## 2023-01-21 DIAGNOSIS — Z8041 Family history of malignant neoplasm of ovary: Secondary | ICD-10-CM

## 2023-01-21 DIAGNOSIS — Z66 Do not resuscitate: Secondary | ICD-10-CM | POA: Diagnosis present

## 2023-01-21 DIAGNOSIS — D72829 Elevated white blood cell count, unspecified: Secondary | ICD-10-CM | POA: Diagnosis present

## 2023-01-21 DIAGNOSIS — I2510 Atherosclerotic heart disease of native coronary artery without angina pectoris: Secondary | ICD-10-CM | POA: Diagnosis present

## 2023-01-21 DIAGNOSIS — Z6831 Body mass index (BMI) 31.0-31.9, adult: Secondary | ICD-10-CM

## 2023-01-21 DIAGNOSIS — Z823 Family history of stroke: Secondary | ICD-10-CM

## 2023-01-21 DIAGNOSIS — Z833 Family history of diabetes mellitus: Secondary | ICD-10-CM

## 2023-01-21 DIAGNOSIS — Z974 Presence of external hearing-aid: Secondary | ICD-10-CM

## 2023-01-21 DIAGNOSIS — R000 Tachycardia, unspecified: Secondary | ICD-10-CM | POA: Diagnosis not present

## 2023-01-21 LAB — BLOOD GAS, ARTERIAL
Acid-Base Excess: 2.2 mmol/L — ABNORMAL HIGH (ref 0.0–2.0)
Bicarbonate: 27.3 mmol/L (ref 20.0–28.0)
Delivery systems: POSITIVE
Expiratory PAP: 5 cm[H2O]
FIO2: 0.45 %
Inspiratory PAP: 10 cm[H2O]
O2 Saturation: 100 %
Patient temperature: 37
pCO2 arterial: 43 mm[Hg] (ref 32–48)
pH, Arterial: 7.41 (ref 7.35–7.45)
pO2, Arterial: 192 mm[Hg] — ABNORMAL HIGH (ref 83–108)

## 2023-01-21 LAB — COMPREHENSIVE METABOLIC PANEL
ALT: 20 U/L (ref 0–44)
AST: 27 U/L (ref 15–41)
Albumin: 3.6 g/dL (ref 3.5–5.0)
Alkaline Phosphatase: 131 U/L — ABNORMAL HIGH (ref 38–126)
Anion gap: 10 (ref 5–15)
BUN: 13 mg/dL (ref 8–23)
CO2: 26 mmol/L (ref 22–32)
Calcium: 9.3 mg/dL (ref 8.9–10.3)
Chloride: 101 mmol/L (ref 98–111)
Creatinine, Ser: 0.82 mg/dL (ref 0.44–1.00)
GFR, Estimated: >60 mL/min (ref >60)
Glucose, Bld: 161 mg/dL — ABNORMAL HIGH (ref 70–99)
Potassium: 3.8 mmol/L (ref 3.5–5.1)
Sodium: 137 mmol/L (ref 135–145)
Total Bilirubin: 0.4 mg/dL (ref <1.2)
Total Protein: 7.4 g/dL (ref 6.5–8.1)

## 2023-01-21 LAB — SARS CORONAVIRUS 2 BY RT PCR: SARS Coronavirus 2 by RT PCR: NEGATIVE

## 2023-01-21 LAB — PROCALCITONIN: Procalcitonin: <0.1 ng/mL

## 2023-01-21 LAB — CBC
HCT: 40.6 % (ref 36.0–46.0)
Hemoglobin: 13.8 g/dL (ref 12.0–15.0)
MCH: 32.2 pg (ref 26.0–34.0)
MCHC: 34 g/dL (ref 30.0–36.0)
MCV: 94.6 fL (ref 80.0–100.0)
Platelets: 276 10*3/uL (ref 150–400)
RBC: 4.29 MIL/uL (ref 3.87–5.11)
RDW: 12.8 % (ref 11.5–15.5)
WBC: 10.8 10*3/uL — ABNORMAL HIGH (ref 4.0–10.5)
nRBC: 0 % (ref 0.0–0.2)

## 2023-01-21 LAB — LACTIC ACID, PLASMA
Lactic Acid, Venous: 2.3 mmol/L (ref 0.5–1.9)
Lactic Acid, Venous: 3.1 mmol/L (ref 0.5–1.9)

## 2023-01-21 LAB — BRAIN NATRIURETIC PEPTIDE: B Natriuretic Peptide: 128.6 pg/mL — ABNORMAL HIGH (ref 0.0–100.0)

## 2023-01-21 LAB — TROPONIN I (HIGH SENSITIVITY): Troponin I (High Sensitivity): 8 ng/L (ref <18)

## 2023-01-21 MED ORDER — POLYETHYLENE GLYCOL 3350 17 G PO PACK
17.0000 g | PACK | Freq: Every day | ORAL | Status: DC | PRN
  Filled 2023-01-21: qty 1

## 2023-01-21 MED ORDER — ACETAMINOPHEN 650 MG RE SUPP: 650.0000 mg | Freq: Four times a day (QID) | RECTAL | Status: DC | PRN

## 2023-01-21 MED ORDER — DILTIAZEM HCL ER COATED BEADS 300 MG PO CP24
300.0000 mg | ORAL_CAPSULE | Freq: Every day | ORAL | Status: DC
  Administered 2023-01-22 – 2023-01-23 (×2): 300 mg via ORAL
  Filled 2023-01-21 (×3): qty 1

## 2023-01-21 MED ORDER — LISINOPRIL-HYDROCHLOROTHIAZIDE 20-25 MG PO TABS: 1.0000 | ORAL_TABLET | Freq: Every day | ORAL | Status: DC

## 2023-01-21 MED ORDER — VITAMIN D 25 MCG (1000 UNIT) PO TABS
1000.0000 [IU] | ORAL_TABLET | Freq: Every day | ORAL | Status: DC
  Administered 2023-01-22 – 2023-01-23 (×2): 1000 [IU] via ORAL
  Filled 2023-01-21 (×2): qty 1

## 2023-01-21 MED ORDER — DIPHENHYDRAMINE HCL 50 MG/ML IJ SOLN
25.0000 mg | Freq: Once | INTRAMUSCULAR | Status: AC
  Administered 2023-01-21: 25 mg via INTRAVENOUS
  Filled 2023-01-21: qty 1

## 2023-01-21 MED ORDER — IPRATROPIUM-ALBUTEROL 0.5-2.5 (3) MG/3ML IN SOLN
3.0000 mL | Freq: Once | RESPIRATORY_TRACT | Status: DC
  Filled 2023-01-21: qty 3

## 2023-01-21 MED ORDER — RACEPINEPHRINE HCL 2.25 % IN NEBU
0.5000 mL | INHALATION_SOLUTION | Freq: Once | RESPIRATORY_TRACT | Status: AC
  Administered 2023-01-21: 0.5 mL via RESPIRATORY_TRACT
  Filled 2023-01-21: qty 0.5

## 2023-01-21 MED ORDER — METOPROLOL SUCCINATE ER 25 MG PO TB24
25.0000 mg | ORAL_TABLET | Freq: Every day | ORAL | Status: DC
  Administered 2023-01-22 – 2023-01-23 (×2): 25 mg via ORAL
  Filled 2023-01-21 (×2): qty 1

## 2023-01-21 MED ORDER — LACTATED RINGERS IV SOLN: INTRAVENOUS | Status: AC

## 2023-01-21 MED ORDER — ATORVASTATIN CALCIUM 20 MG PO TABS
80.0000 mg | ORAL_TABLET | Freq: Every day | ORAL | Status: DC
  Administered 2023-01-21 – 2023-01-22 (×2): 80 mg via ORAL
  Filled 2023-01-21 (×2): qty 4

## 2023-01-21 MED ORDER — FLUOXETINE HCL 20 MG PO CAPS
40.0000 mg | ORAL_CAPSULE | Freq: Every day | ORAL | Status: DC
  Administered 2023-01-22 – 2023-01-23 (×2): 40 mg via ORAL
  Filled 2023-01-21 (×2): qty 2

## 2023-01-21 MED ORDER — DEXAMETHASONE SODIUM PHOSPHATE 10 MG/ML IJ SOLN
10.0000 mg | Freq: Three times a day (TID) | INTRAMUSCULAR | Status: DC
  Administered 2023-01-21 – 2023-01-23 (×5): 10 mg via INTRAVENOUS
  Filled 2023-01-21 (×5): qty 1

## 2023-01-21 MED ORDER — SODIUM CHLORIDE 0.9% FLUSH
3.0000 mL | Freq: Two times a day (BID) | INTRAVENOUS | Status: DC
  Administered 2023-01-21 – 2023-01-23 (×4): 3 mL via INTRAVENOUS

## 2023-01-21 MED ORDER — SODIUM CHLORIDE 0.9 % IV SOLN
1.0000 g | Freq: Once | INTRAVENOUS | Status: AC
  Administered 2023-01-21: 1 g via INTRAVENOUS
  Filled 2023-01-21: qty 10

## 2023-01-21 MED ORDER — ONDANSETRON HCL 4 MG/2ML IJ SOLN: 4.0000 mg | Freq: Four times a day (QID) | INTRAMUSCULAR | Status: DC | PRN

## 2023-01-21 MED ORDER — ACETAMINOPHEN 325 MG PO TABS: 650.0000 mg | ORAL_TABLET | Freq: Four times a day (QID) | ORAL | Status: DC | PRN

## 2023-01-21 MED ORDER — ONDANSETRON HCL 4 MG PO TABS: 4.0000 mg | ORAL_TABLET | Freq: Four times a day (QID) | ORAL | Status: DC | PRN

## 2023-01-21 MED ORDER — METHYLPREDNISOLONE SODIUM SUCC 125 MG IJ SOLR
125.0000 mg | Freq: Once | INTRAMUSCULAR | Status: AC
  Administered 2023-01-21: 125 mg via INTRAVENOUS
  Filled 2023-01-21: qty 2

## 2023-01-21 MED ORDER — RACEPINEPHRINE HCL 2.25 % IN NEBU
INHALATION_SOLUTION | RESPIRATORY_TRACT | Status: AC
  Filled 2023-01-21: qty 0.5

## 2023-01-21 MED ORDER — SODIUM CHLORIDE 0.9 % IV SOLN
1.0000 g | INTRAVENOUS | Status: DC
  Administered 2023-01-22: 1 g via INTRAVENOUS
  Filled 2023-01-21 (×2): qty 10

## 2023-01-21 MED ORDER — APIXABAN 2.5 MG PO TABS
2.5000 mg | ORAL_TABLET | Freq: Two times a day (BID) | ORAL | Status: DC
  Administered 2023-01-21 – 2023-01-23 (×4): 2.5 mg via ORAL
  Filled 2023-01-21 (×4): qty 1

## 2023-01-21 MED ORDER — ISOSORBIDE MONONITRATE ER 30 MG PO TB24
60.0000 mg | ORAL_TABLET | Freq: Every day | ORAL | Status: DC
  Administered 2023-01-22 – 2023-01-23 (×2): 60 mg via ORAL
  Filled 2023-01-21: qty 1
  Filled 2023-01-21: qty 2

## 2023-01-21 NOTE — ED Notes (Signed)
This tech assisted pt to restroom in room. Bed was moved closer to bathroom so patient could stand up and ambulate to toilet. This tech assisted pt with walking. Pt urinated. Pt back in bad. Given remote and HOB positioned to comfort level. Lights turned off per request. No other needs at this time verbalized

## 2023-01-21 NOTE — ED Notes (Addendum)
Pt family calling out at this time for pt stating pt is chocking. Upon assessment pt is coughing against bipap. Pt becomign restless on Bipap and trying to remove Bipap. Bipap removed to ensure pt was not aspirating. Pt states she feels as if throat is closing up. Lung sounds present in all 4 lobes. Throat upon assessment appears to have some swelling. Airway is patent. MD made aware. Bipap placed on pt until MD arrives to bedside. Pt sating at 100% on bipap 45% FiO2. Pt in [place]. Respiratory effort noted to be labored.

## 2023-01-21 NOTE — ED Provider Notes (Signed)
[facility] Provider Note    Event Date/Time   First MD Initiated Contact with Patient 01/21/23 1340     (approximate)   History   Shortness of Breath   HPI  Samantha [name] is a 89 y.o. female with a history of CAD, atrial fibrillation, hypertension brought in by EMS for shortness of breath on CPAP.  They report respiratory distress when they arrived     Physical Exam   Triage Vital Signs: ED Triage Vitals  Encounter Vitals Group     BP 01/21/23 1344 (!) 159/69     Systolic BP Percentile --      Diastolic BP Percentile --      Pulse Rate 01/21/23 1344 100     Resp 01/21/23 1344 (!) 29     Temp 01/21/23 1346 (!) 97.2 F (36.2 C)     Temp Source 01/21/23 1346 Axillary     SpO2 01/21/23 1344 99 %     Weight 01/21/23 1343 73.9 kg (163 lb)     Height 01/21/23 1343 1.524 m (5')     Head Circumference --      Peak Flow --      Pain Score 01/21/23 1340 5     Pain Loc --      Pain Education --      Exclude from Growth Chart --     Most recent vital signs: Vitals:   01/21/23 1509 01/21/23 1510  BP:    Pulse: (!) 105 (!) 104  Resp: (!) 29 (!) 27  Temp:    SpO2: 99% 99%     General: Awake, on CPAP CV:  Good peripheral perfusion.  Resp:  Tachypnea Abd:  No distention.  Soft, nontender Other:  No lower extremity swelling   ED Results / Procedures / Treatments   Labs (all labs ordered are listed, but only abnormal results are displayed) Labs Reviewed  CBC - Abnormal; Notable for the following components:      Result Value   WBC 10.8 (*)    All other components within normal limits  COMPREHENSIVE METABOLIC PANEL - Abnormal; Notable for the following components:   Glucose, Bld 161 (*)    Alkaline Phosphatase 131 (*)    All other components within normal limits  BLOOD GAS, ARTERIAL - Abnormal; Notable for the following components:   pO2, Arterial 192 (*)    Acid-Base Excess 2.2 (*)    All other components within normal limits   BRAIN NATRIURETIC PEPTIDE - Abnormal; Notable for the following components:   B Natriuretic Peptide 128.6 (*)    All other components within normal limits  LACTIC ACID, PLASMA - Abnormal; Notable for the following components:   Lactic Acid, Venous 2.3 (*)    All other components within normal limits  CULTURE, BLOOD (ROUTINE X 2)  CULTURE, BLOOD (ROUTINE X 2)  LACTIC ACID, PLASMA  URINALYSIS, W/ REFLEX TO CULTURE (INFECTION SUSPECTED)  TROPONIN I (HIGH SENSITIVITY)     EKG  ED ECG REPORT I, Jene Every, the attending physician, personally viewed and interpreted this ECG.  Date: 01/21/2023  Rhythm: normal sinus rhythm QRS Axis: normal Intervals: normal ST/T Wave abnormalities: normal Narrative Interpretation: no evidence of acute ischemia    RADIOLOGY Chest x-ray viewed interpreted by me without evidence of pneumonia or pneumothorax    PROCEDURES:  Critical Care performed: yes  CRITICAL CARE Performed by: Jene Every   Total critical care time: 30 minutes  Critical care time was  exclusive of separately billable procedures and treating other patients.  Critical care was necessary to treat or prevent imminent or life-threatening deterioration.  Critical care was time spent personally by me on the following activities: development of treatment plan with patient and/or surrogate as well as nursing, discussions with consultants, evaluation of patient's response to treatment, examination of patient, obtaining history from patient or surrogate, ordering and performing treatments and interventions, ordering and review of laboratory studies, ordering and review of radiographic studies, pulse oximetry and re-evaluation of patient's condition.   Procedures   MEDICATIONS ORDERED IN ED: Medications  Racepinephrine HCl 2.25 % nebulizer solution 0.5 mL (0.5 mLs Nebulization Given 01/21/23 1459)  diphenhydrAMINE (BENADRYL) injection 25 mg (25 mg Intravenous Given 01/21/23  1500)  methylPREDNISolone sodium succinate (SOLU-MEDROL) 125 mg/2 mL injection 125 mg (125 mg Intravenous Given 01/21/23 1459)  Racepinephrine HCl 2.25 % nebulizer solution (  Given 01/21/23 1500)     IMPRESSION / MDM / ASSESSMENT AND PLAN / ED COURSE  I reviewed the triage vital signs and the nursing notes. Patient's presentation is most consistent with acute presentation with potential threat to life or bodily function.  Patient presents with shortness of breath as detailed above, switched to BiPAP upon arrival with improvement in respiratory status.  ABG, chest x-ray reassuring ----------------------------------------- 2:57 PM on 01/21/2023 -----------------------------------------  Patient complaining of a sensation of throat closing up and throat pain.  BiPAP removed, she does have what appears to be inspiratory stridor.  Consulted Dr. Jenne Campus of ENT who will come see the patient in the ED, greatly appreciate his assistance, in the meantime have ordered IV Solu-Medrol, Benadryl, racemic epinephrine in case this is an allergic reaction which I doubt  ----------------------------------------- 3:41 PM on 01/21/2023 ----------------------------------------- Dr. Jenne Campus reports subglottic narrowing, likely related to viral illness bacterial illness, recommends steroids, antibiotics, admission  Patient does not want intubation. Will consult hospitalist team        FINAL CLINICAL IMPRESSION(S) / ED DIAGNOSES   Final diagnoses:  Shortness of breath  Inflammation of subglottic region     Rx / DC Orders   ED Discharge Orders     None        Note:  This document was prepared using Dragon voice recognition software and may include unintentional dictation errors.   Jene Every, MD 01/21/23 [phone]

## 2023-01-21 NOTE — ED Notes (Signed)
RT to bedside

## 2023-01-21 NOTE — H&P (Signed)
History and Physical    Patient: Samantha [name] HQI:[id number] DOB: [birth year] DOA: 01/21/2023 DOS: the patient was seen and examined on 01/21/2023 PCP: Margarita Mail, DO  Patient coming from: Home  Chief Complaint:  Chief Complaint  Patient presents with   Shortness of Breath   HPI: Samantha [name] is a 89 y.o. female with medical history significant of CAD s/p PCI with DES to mid-LAD (2006) and RCA (2007, 2008), atrial fibrillation on Eliquis, hypertension, hyperlipidemia, mild aortic stenosis, who presents to the ED due to shortness of breathe.  Samantha [name] states she noticed shortness of breath that began yesterday afternoon in addition to feeling as though her airway is closing.  She notes that her symptoms progressively worsened and became severe today and that is why she came to the ER.  She denies any chest pain, abdominal pain, nausea, vomiting, diarrhea.  She denies any fevers, chills, cough.  ED Course:  On arrival to the ED, patient was saturating at 100% on BiPAP with respiratory rate of 24.  She was hypertensive at 139/57 with heart rate of 85. Initial workup notable for WBC of 10.8, lactic acid 2.3, glucose 161, creatinine 0.82 with GFR above 60.  Troponin negative at 8 and BNP 128.6.  Chest x-ray was obtained with no acute disease.  ENT was consulted and laryngoscopy was pursued.  Notable for subglottis erythema and edema.  Recommending Decadron and Rocephin.  TRH contacted for admission.  Review of Systems: As mentioned in the history of present illness. All other systems reviewed and are negative.  Past Medical History:  Diagnosis Date   A-fib (HCC) 06/07/2014   Arm fracture 03/10/2017   right   CAD (coronary artery disease) 06/07/2014   Closed right radial fracture 03/21/2017   Depression 09/24/2015   Dyslipidemia 08/16/2016   Dysphonia 08/16/2016   Hypertension    Mild aortic stenosis 08/22/2016   Echo July 2018   Mitral regurgitation 08/22/2016   Echo  July 2018   Stroke [facility])    Vertigo    Wears hearing aid in right ear    Past Surgical History:  Procedure Laterality Date   ABDOMINAL HYSTERECTOMY     APPENDECTOMY     CHOLECYSTECTOMY     FRACTURE SURGERY     Social History:  reports that she has never smoked. She has never used smokeless tobacco. She reports that she does not drink alcohol and does not use drugs.  Allergies  Allergen Reactions   Iodine Nausea And Vomiting   Levaquin [Levofloxacin] Other (See Comments)    Pt states she have nose bleeds and pain in arms and shoulders.    Family History  Problem Relation Age of Onset   Stroke Mother    Heart attack Father    Heart disease Sister    Diabetes Sister    Heart disease Brother    Heart attack Brother    Diabetes Maternal Grandfather    Ovarian cancer Paternal Grandmother    Cancer Brother        bladder cancer   Diabetes Brother    Heart disease Brother    Hypertension Brother    Heart attack Daughter     Prior to Admission medications   Medication Sig Start Date End Date Taking? Authorizing Provider  apixaban (ELIQUIS) 2.5 MG TABS tablet Take 1 tablet (2.5 mg total) by mouth 2 (two) times daily. 11/08/22   Margarita Mail, DO  atorvastatin (LIPITOR) 80 MG tablet Take 1 tablet (80 mg total)  by mouth at bedtime. 01/07/22   Margarita Mail, DO  Cholecalciferol 25 MCG (1000 UT) tablet Take 1,000 Units by mouth daily.    [provider]  diltiazem (CARDIZEM CD) 300 MG 24 hr capsule Take 1 capsule by mouth daily. 09/20/22 09/20/23  [provider]  FLUoxetine (PROZAC) 40 MG capsule Take 1 capsule (40 mg total) by mouth daily. 09/06/22   Margarita Mail, DO  isosorbide mononitrate (IMDUR) 60 MG 24 hr tablet Take 1 tablet (60 mg total) by mouth daily. 08/02/22   Margarita Mail, DO  lisinopril-hydrochlorothiazide (ZESTORETIC) 20-25 MG tablet Take 1 tablet by mouth daily. 01/04/23   Margarita Mail, DO  metoprolol succinate (TOPROL-XL) 25 MG  24 hr tablet Take 1 tablet (25 mg total) by mouth daily. 11/08/22   Margarita Mail, DO  Multiple Vitamin (MULTIVITAMIN) tablet Take 1 tablet by mouth daily.    [provider]    Physical Exam: Vitals:   01/21/23 1508 01/21/23 1509 01/21/23 1510 01/21/23 1600  BP:    (!) 145/63  Pulse: (!) 113 (!) 105 (!) 104 88  Resp: (!) 27 (!) 29 (!) 27 (!) 25  Temp:    97.8 F (36.6 C)  TempSrc:    Axillary  SpO2: 99% 99% 99% 98%  Weight:      Height:       Physical Exam Vitals and nursing note reviewed.  Constitutional:      Appearance: She is obese.  HENT:     Head: Normocephalic and atraumatic.     Mouth/Throat:     Mouth: Mucous membranes are dry.     Pharynx: Oropharynx is clear.  Eyes:     Conjunctiva/sclera: Conjunctivae normal.     Pupils: Pupils are equal, round, and reactive to light.  Cardiovascular:     Rate and Rhythm: Normal rate and regular rhythm.     Heart sounds: No murmur heard. Pulmonary:     Effort: Tachypnea present. No accessory muscle usage.     Breath sounds: Normal breath sounds. Stridor and transmitted upper airway sounds present.  Abdominal:     General: Bowel sounds are normal. There is no distension.     Palpations: Abdomen is soft.     Tenderness: There is no abdominal tenderness. There is no guarding.  Musculoskeletal:     Cervical back: Neck supple.     Right lower leg: No edema.     Left lower leg: No edema.  Skin:    General: Skin is warm and dry.  Neurological:     Mental Status: She is alert and oriented to person, place, and time. Mental status is at baseline.  Psychiatric:        Mood and Affect: Mood normal.        Behavior: Behavior normal.    Data Reviewed: CBC with WBC of 10.8, hemoglobin of 13.8, platelet 276 CMP with sodium of 137, potassium 3.8, bicarb 26, glucose 161, BUN 13, creatinine 0.82, AST 27, ALT 20 and GFR above 60 Lactic acid 2.3 BNP 128.6 Troponin 8 ABG with pH of 7.41, pCO2 43, pO2 192  EKG  personally reviewed.  Sinus rhythm with rate of 101.  First-degree AV block.  No ST or T wave changes concerning for acute ischemia.  DG Chest Port 1 View Result Date: 01/21/2023 CLINICAL DATA:  Shortness of breath with hypoxia. EXAM: PORTABLE CHEST 1 VIEW COMPARISON:  06/17/2021 FINDINGS: Lungs are adequately inflated with minimal scarring lateral left base. No acute airspace process or  effusion. Old left rib fractures. Cardiomediastinal silhouette and remainder of the exam is unchanged. IMPRESSION: No active disease. Electronically Signed   By: Elberta Fortis M.D.   On: 01/21/2023 14:06   Results are pending, will review when available.  Assessment and Plan:  * Subglottic inflammation Patient presenting with <24 hours of shortness of breath and feeling of throat swelling found to have subglottis inflammation with stenosis approximating 40-50%.  Mild leukocytosis noted.  ENT feels this is likely viral in nature.  - ENT consulted; appreciate their recommendations - Telemetry monitoring - COVID-19 PCR viral panel pending - Procalcitonin pending - S/p Solu-Medrol 125 mg once.  Start Decadron 10 mg every 8 hours - Continue Rocephin 1 g daily  CAD (coronary artery disease) No chest pain reported at this time.  - Continue home regimen  A-fib (HCC) EKG with current sinus rhythm.  - Continue home metoprolol, diltiazem, and Eliquis  Essential hypertension - Continue home regimen  Advance Care Planning:   Code Status: Limited: Do not attempt resuscitation (DNR) -DNR-LIMITED -Do Not Intubate/DNI verified by patient with her sister at bedside.  She would not want any invasive managements.  If her subglottis swelling should worsen, she would not want tracheostomy.  Consults: ENT  Family Communication: Patient's sister updated at bedside  Severity of Illness: The appropriate patient status for this patient is OBSERVATION. Observation status is judged to be reasonable and necessary in order  to provide the required intensity of service to ensure the patient's safety. The patient's presenting symptoms, physical exam findings, and initial radiographic and laboratory data in the context of their medical condition is felt to place them at decreased risk for further clinical deterioration. Furthermore, it is anticipated that the patient will be medically stable for discharge from the hospital within 2 midnights of admission.   Author: Verdene Lennert, MD 01/21/2023 4:57 PM  For on call review [email or web address].

## 2023-01-21 NOTE — Assessment & Plan Note (Signed)
No chest pain reported at this time.  - Continue home regimen

## 2023-01-21 NOTE — Consult Note (Addendum)
Samantha [name], Samantha [name] [id number] 05-23-1928 Samantha Every, MD  Reason for Consult: Stridor  HPI: 89 year old female presented to the emergency room with shortness of breath mild dysphagia and sore throat.  This noted in the last 24 to 48 hours got progressively worse.  She has received racemic epi and Decadron in the emergency room and has improved.  He was recently admitted to [facility] for evaluation and treatment of a intracerebral aneurysm.  She has no history of recent intubation.  She may have had a mild upper respiratory infection but otherwise has been stable.  Allergies:  Allergies  Allergen Reactions   Iodine Nausea And Vomiting   Levaquin [Levofloxacin] Other (See Comments)    Pt states she have nose bleeds and pain in arms and shoulders.    ROS: Review of systems normal other than 12 systems except per HPI.  PMH:  Past Medical History:  Diagnosis Date   A-fib (HCC) 06/07/2014   Arm fracture 03/10/2017   right   CAD (coronary artery disease) 06/07/2014   Closed right radial fracture 03/21/2017   Depression 09/24/2015   Dyslipidemia 08/16/2016   Dysphonia 08/16/2016   Hypertension    Mild aortic stenosis 08/22/2016   Echo July 2018   Mitral regurgitation 08/22/2016   Echo July 2018   Stroke [facility])    Vertigo    Wears hearing aid in right ear     FH:  Family History  Problem Relation Age of Onset   Stroke Mother    Heart attack Father    Heart disease Sister    Diabetes Sister    Heart disease Brother    Heart attack Brother    Diabetes Maternal Grandfather    Ovarian cancer Paternal Grandmother    Cancer Brother        bladder cancer   Diabetes Brother    Heart disease Brother    Hypertension Brother    Heart attack Daughter     SH:  Social History   Socioeconomic History   Marital status: Widowed    Spouse name: Not on file   Number of children: 3   Years of education: Not on file   Highest education level: Not on file  Occupational History    Not on file  Tobacco Use   Smoking status: Never   Smokeless tobacco: Never  Vaping Use   Vaping status: Never Used  Substance and Sexual Activity   Alcohol use: No   Drug use: No   Sexual activity: Never  Other Topics Concern   Not on file  Social History Narrative   Only has 1 living child left   Social Drivers of Health   Financial Resource Strain: Low Risk  (10/22/2020)   Overall Financial Resource Strain (CARDIA)    Difficulty of Paying Living Expenses: Not very hard  Food Insecurity: No Food Insecurity (10/22/2020)   Hunger Vital Sign    Worried About Running Out of Food in the Last Year: Never true    Ran Out of Food in the Last Year: Never true  Transportation Needs: Unmet Transportation Needs (08/25/2022)   PRAPARE - Transportation    Lack of Transportation (Medical): Yes    Lack of Transportation (Non-Medical): Yes  Physical Activity: Inactive (10/22/2020)   Exercise Vital Sign    Days of Exercise per Week: 0 days    Minutes of Exercise per Session: 0 min  Stress: No Stress Concern Present (10/22/2020)   [employer] of Occupational Health - Occupational Stress Questionnaire  Feeling of Stress : Not at all  Social Connections: Moderately Isolated (10/22/2020)   Social Connection and Isolation Panel [NHANES]    Frequency of Communication with Friends and Family: More than three times a week    Frequency of Social Gatherings with Friends and Family: Three times a week    Attends Religious Services: More than 4 times per year    Active Member of Clubs or Organizations: No    Attends Banker Meetings: Never    Marital Status: Widowed  Intimate Partner Violence: Not At Risk (10/22/2020)   Humiliation, Afraid, Rape, and Kick questionnaire    Fear of Current or Ex-Partner: No    Emotionally Abused: No    Physically Abused: No    Sexually Abused: No    PSH:  Past Surgical History:  Procedure Laterality Date   ABDOMINAL HYSTERECTOMY      APPENDECTOMY     CHOLECYSTECTOMY     FRACTURE SURGERY      Physical  Exam: Patient is awake and alert sitting upright in the bed pending on her own secretions.  Mildly weak voice but no obvious significant stridor.  The anterior nose benign external ears.  Normal the oral cavity or pharynx she had previous tonsillectomy but no evidence of edema or swelling.  Procedure: Flexible fiberoptic laryngoscopy-after patient consent a topical anesthetic phenylephrine lidocaine solutions was dripped within each nostril.  Flexible fiberoptic laryngoscope was introduced through the right nostril.  Examination of the tongue base and epiglottis were normal.  The glottis appeared normal with normal vocal cord mobility.  The immediate subglottis had significant circumferential erythema and edema consistent with mild subglottic stenosis.  This appeared to be approximately 40 to 50% obstructed   A/P: Subglottic narrowing with no history of trauma acute onset.  This is likely a  virally mediated type infection.  She has improved significantly on the racemic epi and the Decadron.  I had a long talk with her primary caregiver who is a neighbor as well as her sister and [name] herself.  I asked her in detail about possible intervention that she would want in case her symptoms worsen.  She was adamant that she does not want to be intubated and she does not want to have tracheotomy placed.  She would allow IV medication of Decadron which I would recommend 10 mg IV [name] 8 hours.  Empiric coverage with IV Rocephin for any possible secondary bacterial infection would also be indicated.  As the swelling decreases would recommend she go home on a prednisone taper of 60 mg taper over 12 days.  I have passed this on to Dr. Cyril Loosen her wishes will be documented in her chart and her DNR status updated.   Davina Poke 01/21/2023 3:50 PM

## 2023-01-21 NOTE — ED Notes (Addendum)
MD at bedside. RT called. Per MD pt placed on [place] @ 3L. Pt sating at 99%. Labored breathing accessed.

## 2023-01-21 NOTE — Assessment & Plan Note (Addendum)
Patient presenting with <24 hours of shortness of breath and feeling of throat swelling found to have subglottis inflammation with stenosis approximating 40-50%.  Mild leukocytosis noted.  ENT feels this is likely viral in nature.  - ENT consulted; appreciate their recommendations - Telemetry monitoring - COVID-19 PCR viral panel pending - Procalcitonin pending - S/p Solu-Medrol 125 mg once.  Start Decadron 10 mg every 8 hours - Continue Rocephin 1 g daily

## 2023-01-21 NOTE — ED Notes (Signed)
Patient noted to still have continued stridor and difficulty talking due to breathing effort when this RN assessed patient. Jon Billings, NP messaged via secure chat and orders obtained.

## 2023-01-21 NOTE — ED Triage Notes (Signed)
Pt in via [facility] c/o SOB. Per EMS sats in the 80's on RA upon arrival. Pt on Cpap sating @ 98%. Pt has hx of CHF.     Per EMS pt given 2g of mag Albuterol treatment   Vitals Per EMS:  O2-99% HR-103 CO2-43

## 2023-01-21 NOTE — Assessment & Plan Note (Signed)
-   Continue home regimen 

## 2023-01-21 NOTE — Assessment & Plan Note (Signed)
EKG with current sinus rhythm.  - Continue home metoprolol, diltiazem, and Eliquis

## 2023-01-22 DIAGNOSIS — E669 Obesity, unspecified: Secondary | ICD-10-CM | POA: Insufficient documentation

## 2023-01-22 DIAGNOSIS — Z8249 Family history of ischemic heart disease and other diseases of the circulatory system: Secondary | ICD-10-CM | POA: Diagnosis not present

## 2023-01-22 DIAGNOSIS — R1310 Dysphagia, unspecified: Secondary | ICD-10-CM | POA: Diagnosis not present

## 2023-01-22 DIAGNOSIS — Z90710 Acquired absence of both cervix and uterus: Secondary | ICD-10-CM | POA: Diagnosis not present

## 2023-01-22 DIAGNOSIS — I350 Nonrheumatic aortic (valve) stenosis: Secondary | ICD-10-CM | POA: Diagnosis not present

## 2023-01-22 DIAGNOSIS — Z66 Do not resuscitate: Secondary | ICD-10-CM | POA: Diagnosis not present

## 2023-01-22 DIAGNOSIS — I2510 Atherosclerotic heart disease of native coronary artery without angina pectoris: Secondary | ICD-10-CM | POA: Diagnosis not present

## 2023-01-22 DIAGNOSIS — Z8041 Family history of malignant neoplasm of ovary: Secondary | ICD-10-CM | POA: Diagnosis not present

## 2023-01-22 DIAGNOSIS — I340 Nonrheumatic mitral (valve) insufficiency: Secondary | ICD-10-CM | POA: Diagnosis not present

## 2023-01-22 DIAGNOSIS — Z823 Family history of stroke: Secondary | ICD-10-CM | POA: Diagnosis not present

## 2023-01-22 DIAGNOSIS — Z8052 Family history of malignant neoplasm of bladder: Secondary | ICD-10-CM | POA: Diagnosis not present

## 2023-01-22 DIAGNOSIS — Z8673 Personal history of transient ischemic attack (TIA), and cerebral infarction without residual deficits: Secondary | ICD-10-CM | POA: Diagnosis not present

## 2023-01-22 DIAGNOSIS — Z955 Presence of coronary angioplasty implant and graft: Secondary | ICD-10-CM | POA: Diagnosis not present

## 2023-01-22 DIAGNOSIS — Z7901 Long term (current) use of anticoagulants: Secondary | ICD-10-CM | POA: Diagnosis not present

## 2023-01-22 DIAGNOSIS — R0602 Shortness of breath: Secondary | ICD-10-CM | POA: Diagnosis not present

## 2023-01-22 DIAGNOSIS — Z79899 Other long term (current) drug therapy: Secondary | ICD-10-CM | POA: Diagnosis not present

## 2023-01-22 DIAGNOSIS — E785 Hyperlipidemia, unspecified: Secondary | ICD-10-CM | POA: Diagnosis not present

## 2023-01-22 DIAGNOSIS — D72829 Elevated white blood cell count, unspecified: Secondary | ICD-10-CM | POA: Diagnosis not present

## 2023-01-22 DIAGNOSIS — Z888 Allergy status to other drugs, medicaments and biological substances status: Secondary | ICD-10-CM | POA: Diagnosis not present

## 2023-01-22 DIAGNOSIS — I480 Paroxysmal atrial fibrillation: Secondary | ICD-10-CM | POA: Diagnosis not present

## 2023-01-22 DIAGNOSIS — I10 Essential (primary) hypertension: Secondary | ICD-10-CM | POA: Diagnosis not present

## 2023-01-22 DIAGNOSIS — J386 Stenosis of larynx: Secondary | ICD-10-CM | POA: Diagnosis not present

## 2023-01-22 DIAGNOSIS — J040 Acute laryngitis: Secondary | ICD-10-CM | POA: Diagnosis not present

## 2023-01-22 DIAGNOSIS — Z833 Family history of diabetes mellitus: Secondary | ICD-10-CM | POA: Diagnosis not present

## 2023-01-22 DIAGNOSIS — B9789 Other viral agents as the cause of diseases classified elsewhere: Secondary | ICD-10-CM | POA: Diagnosis not present

## 2023-01-22 DIAGNOSIS — Z974 Presence of external hearing-aid: Secondary | ICD-10-CM | POA: Diagnosis not present

## 2023-01-22 LAB — CBC WITH DIFFERENTIAL/PLATELET
Abs Immature Granulocytes: 0.05 10*3/uL (ref 0.00–0.07)
Basophils Absolute: 0 10*3/uL (ref 0.0–0.1)
Basophils Relative: 0 %
Eosinophils Absolute: 0 10*3/uL (ref 0.0–0.5)
Eosinophils Relative: 0 %
HCT: 36.4 % (ref 36.0–46.0)
Hemoglobin: 12.2 g/dL (ref 12.0–15.0)
Immature Granulocytes: 1 %
Lymphocytes Relative: 11 %
Lymphs Abs: 1.1 10*3/uL (ref 0.7–4.0)
MCH: 31.7 pg (ref 26.0–34.0)
MCHC: 33.5 g/dL (ref 30.0–36.0)
MCV: 94.5 fL (ref 80.0–100.0)
Monocytes Absolute: 0.2 10*3/uL (ref 0.1–1.0)
Monocytes Relative: 2 %
Neutro Abs: 8.5 10*3/uL — ABNORMAL HIGH (ref 1.7–7.7)
Neutrophils Relative %: 86 %
Platelets: 238 10*3/uL (ref 150–400)
RBC: 3.85 MIL/uL — ABNORMAL LOW (ref 3.87–5.11)
RDW: 13.1 % (ref 11.5–15.5)
WBC: 9.9 10*3/uL (ref 4.0–10.5)
nRBC: 0 % (ref 0.0–0.2)

## 2023-01-22 LAB — BASIC METABOLIC PANEL
Anion gap: 9 (ref 5–15)
BUN: 16 mg/dL (ref 8–23)
CO2: 25 mmol/L (ref 22–32)
Calcium: 8.9 mg/dL (ref 8.9–10.3)
Chloride: 104 mmol/L (ref 98–111)
Creatinine, Ser: 0.81 mg/dL (ref 0.44–1.00)
GFR, Estimated: >60 mL/min (ref >60)
Glucose, Bld: 176 mg/dL — ABNORMAL HIGH (ref 70–99)
Potassium: 4.3 mmol/L (ref 3.5–5.1)
Sodium: 138 mmol/L (ref 135–145)

## 2023-01-22 LAB — RESPIRATORY PANEL BY PCR

## 2023-01-22 MED ORDER — MELATONIN 5 MG PO TABS
5.0000 mg | ORAL_TABLET | Freq: Once | ORAL | Status: AC
  Administered 2023-01-23: 5 mg via ORAL
  Filled 2023-01-22: qty 1

## 2023-01-22 MED ORDER — HYDROCHLOROTHIAZIDE 25 MG PO TABS
25.0000 mg | ORAL_TABLET | Freq: Every day | ORAL | Status: DC
  Administered 2023-01-22: 25 mg via ORAL
  Filled 2023-01-22: qty 1

## 2023-01-22 MED ORDER — LISINOPRIL 10 MG PO TABS
20.0000 mg | ORAL_TABLET | Freq: Every day | ORAL | Status: DC
  Administered 2023-01-22: 20 mg via ORAL
  Filled 2023-01-22: qty 2

## 2023-01-22 MED ORDER — ALBUTEROL SULFATE (2.5 MG/3ML) 0.083% IN NEBU
2.5000 mg | INHALATION_SOLUTION | Freq: Four times a day (QID) | RESPIRATORY_TRACT | Status: DC
Start: 2023-01-22 — End: 2023-01-23
  Administered 2023-01-22 – 2023-01-23 (×5): 2.5 mg via RESPIRATORY_TRACT
  Filled 2023-01-22 (×6): qty 3

## 2023-01-22 MED ORDER — BUDESONIDE 0.25 MG/2ML IN SUSP
0.2500 mg | Freq: Two times a day (BID) | RESPIRATORY_TRACT | Status: DC
  Administered 2023-01-22 – 2023-01-23 (×3): 0.25 mg via RESPIRATORY_TRACT
  Filled 2023-01-22 (×3): qty 2

## 2023-01-22 NOTE — ED Notes (Signed)
Patient placed on bedpan. Cleaned and brief placed. Patient underwear tossed in trash per patient request. New bedsheet, pad and hospital gown put on patient. Head of bed adjusted up.    Patients hair bow, gown, and slippers placed in patient belongings bag next to her bed.   Patient noted to currently have on hearing aids.

## 2023-01-22 NOTE — Progress Notes (Addendum)
  Progress Note   Patient: Samantha [name] ZOX:[id number] DOB: 08-06-1928 DOA: 01/21/2023     0 DOS: the patient was seen and examined on 01/22/2023   Brief hospital course: Samantha [name] is a 89 y.o. female with medical history significant of CAD s/p PCI with DES to mid-LAD (2006) and RCA (2007, 2008), atrial fibrillation on Eliquis, hypertension, hyperlipidemia, mild aortic stenosis, who presents to the ED due to shortness of breathe.  Patient is seen by ENT, laryngoscopy was performed, showed significant subglottic narrowing, potentially caused by infection.  Patient was treated with as needed racemic epi and Decadron.   Principal Problem:   Subglottic inflammation Active Problems:   Essential hypertension   A-fib (HCC)   CAD (coronary artery disease)   Assessment and Plan: * Subglottic inflammation Patient presenting with <24 hours of shortness of breath and feeling of throat swelling found to have subglottis inflammation with stenosis approximating 40-50%.  Mild leukocytosis noted.  ENT feels this is likely viral in nature. Discussed with Dr. Jenne Campus, patient still has significant wheezing, will keep patient in the hospital.  Continue albuterol nebulization, racemic epi as needed, systemic and inhaled steroids.  Also started Rocephin. At discharge, patient will be treated with steroid taper with prednisone 60 mg to start, to finish 12-day course.  CAD (coronary artery disease) No chest pain reported at this time.  - Continue home regimen  Paroxysmal A-fib (HCC) EKG with current sinus rhythm.  - Continue home metoprolol, diltiazem, and Eliquis  Essential hypertension Discontinue lisinopril in case it caused airway closure.       Subjective:  Patient still complaining significant wheezing, but feels better than yesterday.  Physical Exam: Vitals:   01/22/23 0600 01/22/23 0955 01/22/23 1000 01/22/23 1011  BP: (!) 141/62 (!) 160/68 (!) 142/63   Pulse: 80 85 88    Resp: (!) 21  (!) 21   Temp:    97.9 F (36.6 C)  TempSrc:    Axillary  SpO2: 97%  100%   Weight:      Height:       General exam: Appears calm and comfortable  Respiratory system: Wheezing. Respiratory effort normal. Cardiovascular system: S1 & S2 heard, RRR. No JVD, murmurs, rubs, gallops or clicks. No pedal edema. Gastrointestinal system: Abdomen is nondistended, soft and nontender. No organomegaly or masses felt. Normal bowel sounds heard. Central nervous system: Alert and oriented. No focal neurological deficits. Extremities: Symmetric 5 x 5 power. Skin: No rashes, lesions or ulcers Psychiatry:  Mood & affect appropriate.    Data Reviewed:  Reviewed chest x-ray results, lab results.  Family Communication: sister updated at bedside  Disposition: Status is: Inpatient Remains inpatient appropriate because: Severity of disease, IV treatment.     Time spent: 35 minutes  Author: Marrion Coy, MD 01/22/2023 10:44 AM  For on call review [email or web address].

## 2023-01-22 NOTE — ED Notes (Signed)
Call light answered. Pt was assisted to the bedpan and positioned for comfort. The procedure was tolerated well, and Pt urinated once, bedpan removed. Call light repositioned in reach. In accordance with the pt request, the lights were dimmed, and door was left open.

## 2023-01-22 NOTE — ED Notes (Signed)
Patient given medications, able to swallow appropriately without difficulty. Provided with saltines at request. VSS, CCM in use, call light within reach. No other needs identified at this time.

## 2023-01-22 NOTE — Hospital Course (Signed)
Samantha [name] is a 89 y.o. female with medical history significant of CAD s/p PCI with DES to mid-LAD (2006) and RCA (2007, 2008), atrial fibrillation on Eliquis, hypertension, hyperlipidemia, mild aortic stenosis, who presents to the ED due to shortness of breathe.  Patient is seen by ENT, laryngoscopy was performed, showed significant subglottic narrowing, potentially caused by infection.  Patient was treated with as needed racemic epi and Decadron.  Patient slowly improved with steroid and also given Rocephin.  She is currently saturating well on room air.  She is being discharged on 3 days of Augmentin and a tapering course of prednisone starting at 60 mg, decreasing every other day 10 mg as advised by ENT.  Also discontinue home lisinopril as it might be due to angioedema.  She will continue the rest of her home medications and need to have a close follow-up with her providers for further management.

## 2023-01-22 NOTE — ED Notes (Signed)
Patient resting quietly in bed, states inability to sleep at night anymore. Declined taking meds to help her sleep. Morning labs obtained. Temperature repeated. VSS, CCM in use, call light within reach. No other needs identified at this time.

## 2023-01-22 NOTE — ED Notes (Signed)
Advised nurse that patient has ready bed 

## 2023-01-23 ENCOUNTER — Other Ambulatory Visit: Payer: Self-pay | Admitting: Internal Medicine

## 2023-01-23 ENCOUNTER — Telehealth: Payer: Self-pay

## 2023-01-23 DIAGNOSIS — I10 Essential (primary) hypertension: Secondary | ICD-10-CM | POA: Diagnosis not present

## 2023-01-23 DIAGNOSIS — E785 Hyperlipidemia, unspecified: Secondary | ICD-10-CM

## 2023-01-23 DIAGNOSIS — J040 Acute laryngitis: Secondary | ICD-10-CM | POA: Diagnosis not present

## 2023-01-23 DIAGNOSIS — E669 Obesity, unspecified: Secondary | ICD-10-CM

## 2023-01-23 DIAGNOSIS — R0602 Shortness of breath: Secondary | ICD-10-CM | POA: Diagnosis not present

## 2023-01-23 DIAGNOSIS — I480 Paroxysmal atrial fibrillation: Secondary | ICD-10-CM | POA: Diagnosis not present

## 2023-01-23 MED ORDER — ALBUTEROL SULFATE HFA 108 (90 BASE) MCG/ACT IN AERS
2.0000 | INHALATION_SPRAY | Freq: Four times a day (QID) | RESPIRATORY_TRACT | 2 refills | Status: AC | PRN
Start: 2023-01-23 — End: ?

## 2023-01-23 MED ORDER — PREDNISONE 10 MG (21) PO TBPK: ORAL_TABLET | ORAL | 0 refills | Status: DC

## 2023-01-23 MED ORDER — AMOXICILLIN-POT CLAVULANATE 875-125 MG PO TABS: 1.0000 | ORAL_TABLET | Freq: Two times a day (BID) | ORAL | 0 refills | Status: AC

## 2023-01-23 NOTE — Progress Notes (Signed)
Pt sats 94 % RA at rest Pt stats remained 92%-94% ambulating RA

## 2023-01-23 NOTE — Transitions of Care (Post Inpatient/ED Visit) (Unsigned)
   01/23/2023  Name: Samantha [name] MRN: [id number] DOB: 12-31-1928  Today's TOC FU Call Status: Today's TOC FU Call Status:: Unsuccessful Call (1st Attempt) Unsuccessful Call (1st Attempt) Date: 01/23/23  Attempted to reach the patient regarding the most recent Inpatient/ED visit.  Follow Up Plan: Additional outreach attempts will be made to reach the patient to complete the Transitions of Care (Post Inpatient/ED visit) call.   Signature Karena Addison, LPN [facility] Nurse Health Advisor Direct Dial [phone]

## 2023-01-23 NOTE — Discharge Summary (Addendum)
Physician Discharge Summary   Patient: Samantha [name] MRN: [id number] DOB: 1928/05/25  Admit date:     01/21/2023  Discharge date: 01/23/23  Discharge Physician: Arnetha Courser   PCP: Margarita Mail, DO   Recommendations at discharge:  Please obtain CBC and BMP on follow-up Patient is being given 3 days of antibiotics and a tapering course of steroid, please ensure the completion of course Follow-up with primary care provider Follow-up with ENT if needed  Discharge Diagnoses: Principal Problem:   Subglottic inflammation Active Problems:   Essential hypertension   A-fib (HCC)   CAD (coronary artery disease)   Shortness of breath   Obesity (BMI 30-39.9)   Hospital Course: ERIYAN REIGNER is a 89 y.o. female with medical history significant of CAD s/p PCI with DES to mid-LAD (2006) and RCA (2007, 2008), atrial fibrillation on Eliquis, hypertension, hyperlipidemia, mild aortic stenosis, who presents to the ED due to shortness of breathe.  Patient is seen by ENT, laryngoscopy was performed, showed significant subglottic narrowing, potentially caused by infection.  Patient was treated with as needed racemic epi and Decadron.  Patient slowly improved with steroid and also given Rocephin.  She is currently saturating well on room air.  She is being discharged on 3 days of Augmentin and a tapering course of prednisone starting at 60 mg, decreasing every other day 10 mg as advised by ENT.  Also discontinue home lisinopril as it might be due to angioedema.  She will continue the rest of her home medications and need to have a close follow-up with her providers for further management.  Assessment and Plan: * Subglottic inflammation Patient presenting with <24 hours of shortness of breath and feeling of throat swelling found to have subglottis inflammation with stenosis approximating 40-50%.  Mild leukocytosis noted.  ENT feels this is likely viral in nature.  Patient received Rocephin  and Decadron while in the hospital and is being discharged on Augmentin and prednisone  CAD (coronary artery disease) No chest pain reported at this time.  - Continue home regimen  A-fib (HCC) EKG with current sinus rhythm.  - Continue home metoprolol, diltiazem, and Eliquis  Essential hypertension - Continue home regimen  Consultants: ENT Procedures performed: Laryngoscopy Disposition: Home Diet recommendation:  Discharge Diet Orders (From admission, onward)     Start     Ordered   01/23/23 0000  Diet - low sodium heart healthy        01/23/23 1126           Cardiac diet DISCHARGE MEDICATION: Allergies as of 01/23/2023       Reactions   Iodine Nausea And Vomiting   Levaquin [levofloxacin] Other (See Comments)   Pt states she have nose bleeds and pain in arms and shoulders.        Medication List     STOP taking these medications    lisinopril-hydrochlorothiazide 20-25 MG tablet Commonly known as: ZESTORETIC       TAKE these medications    albuterol 108 (90 Base) MCG/ACT inhaler Commonly known as: VENTOLIN HFA Inhale 2 puffs into the lungs every 6 (six) hours as needed for wheezing or shortness of breath.   amoxicillin-clavulanate 875-125 MG tablet Commonly known as: AUGMENTIN Take 1 tablet by mouth 2 (two) times daily for 3 days.   atorvastatin 80 MG tablet Commonly known as: LIPITOR Take 1 tablet (80 mg total) by mouth at bedtime.   Cholecalciferol 25 MCG (1000 UT) tablet Take 1,000 Units by mouth daily.  diltiazem 300 MG 24 hr capsule Commonly known as: CARDIZEM CD Take 1 capsule by mouth daily.   Eliquis 2.5 MG Tabs tablet Generic drug: apixaban Take 1 tablet (2.5 mg total) by mouth 2 (two) times daily.   FLUoxetine 40 MG capsule Commonly known as: PROZAC Take 1 capsule (40 mg total) by mouth daily.   isosorbide mononitrate 60 MG 24 hr tablet Commonly known as: IMDUR Take 1 tablet (60 mg total) by mouth daily.   metoprolol  succinate 25 MG 24 hr tablet Commonly known as: TOPROL-XL Take 1 tablet (25 mg total) by mouth daily.   multivitamin tablet Take 1 tablet by mouth daily.   predniSONE 10 MG (21) Tbpk tablet Commonly known as: STERAPRED UNI-PAK 21 TAB Take 6 tablets tomorrow, decrease 1 tablet every other day until you complete the course        Follow-up Information     Margarita Mail, DO Follow up.   Specialty: Internal Medicine Why: Hospital follow up Contact information: [address] [place] [address] [phone]                Discharge Exam: Samantha [name] Weights   01/21/23 1343  Weight: 73.9 kg   General.  Pleasant elderly lady, in no acute distress. Pulmonary.  Lungs clear bilaterally, normal respiratory effort. CV.  Regular rate and rhythm, no JVD, rub or murmur. Abdomen.  Soft, nontender, nondistended, BS positive. CNS.  Alert and oriented .  No focal neurologic deficit. Extremities.  No edema, no cyanosis, pulses intact and symmetrical. Psychiatry.  Judgment and insight appears normal.   Condition at discharge: stable  The results of significant diagnostics from this hospitalization (including imaging, microbiology, ancillary and laboratory) are listed below for reference.   Imaging Studies: DG Chest Port 1 View Result Date: 01/21/2023 CLINICAL DATA:  Shortness of breath with hypoxia. EXAM: PORTABLE CHEST 1 VIEW COMPARISON:  06/17/2021 FINDINGS: Lungs are adequately inflated with minimal scarring lateral left base. No acute airspace process or effusion. Old left rib fractures. Cardiomediastinal silhouette and remainder of the exam is unchanged. IMPRESSION: No active disease. Electronically Signed   By: Elberta Fortis M.D.   On: 01/21/2023 14:06    Microbiology: Results for orders placed or performed during the hospital encounter of 01/21/23  Blood culture (routine x 2)     Status: None (Preliminary result)   Collection Time: 01/21/23  1:42 PM    Specimen: BLOOD  Result Value Ref Range Status   Specimen Description BLOOD RIGHT [facility]  Final   Special Requests   Final    BOTTLES DRAWN AEROBIC AND ANAEROBIC Blood Culture adequate volume   Culture   Final    NO GROWTH 2 DAYS Performed at [facility], [address] [place], [place] [address]    Report Status PENDING  Incomplete  Blood culture (routine x 2)     Status: None (Preliminary result)   Collection Time: 01/21/23  4:10 PM   Specimen: BLOOD  Result Value Ref Range Status   Specimen Description BLOOD RIGHT HAND  Final   Special Requests   Final    BOTTLES DRAWN AEROBIC AND ANAEROBIC Blood Culture results may not be optimal due to an inadequate volume of blood received in culture bottles   Culture   Final    NO GROWTH 2 DAYS Performed at [facility], [address] [place], [place] [address]    Report Status PENDING  Incomplete  Respiratory (~20 pathogens) panel by PCR  Status: None   Collection Time: 01/21/23  6:43 PM   Specimen: Nasopharyngeal Swab; Respiratory  Result Value Ref Range Status   Adenovirus NOT DETECTED NOT DETECTED Final   Coronavirus 229E NOT DETECTED NOT DETECTED Final    Comment: (NOTE) The Coronavirus on the Respiratory Panel, DOES NOT test for the novel  Coronavirus (2019 nCoV)    Coronavirus HKU1 NOT DETECTED NOT DETECTED Final   Coronavirus NL63 NOT DETECTED NOT DETECTED Final   Coronavirus OC43 NOT DETECTED NOT DETECTED Final   Metapneumovirus NOT DETECTED NOT DETECTED Final   Rhinovirus / Enterovirus NOT DETECTED NOT DETECTED Final   Influenza A NOT DETECTED NOT DETECTED Final   Influenza B NOT DETECTED NOT DETECTED Final   Parainfluenza Virus 1 NOT DETECTED NOT DETECTED Final   Parainfluenza Virus 2 NOT DETECTED NOT DETECTED Final   Parainfluenza Virus 3 NOT DETECTED NOT DETECTED Final   Parainfluenza Virus 4 NOT DETECTED NOT DETECTED Final   Respiratory Syncytial Virus NOT DETECTED NOT DETECTED Final   Bordetella  pertussis NOT DETECTED NOT DETECTED Final   Bordetella Parapertussis NOT DETECTED NOT DETECTED Final   Chlamydophila pneumoniae NOT DETECTED NOT DETECTED Final   Mycoplasma pneumoniae NOT DETECTED NOT DETECTED Final    Comment: Performed at [facility], [address], [place], [place] [address]  SARS Coronavirus 2 by RT PCR (hospital order, performed in [facility] hospital lab) *cepheid single result test* Anterior Nasal Swab     Status: None   Collection Time: 01/21/23  6:43 PM   Specimen: Anterior Nasal Swab  Result Value Ref Range Status   SARS Coronavirus 2 by RT PCR NEGATIVE NEGATIVE Final    Comment: (NOTE) SARS-CoV-2 target nucleic acids are NOT DETECTED.  The SARS-CoV-2 RNA is generally detectable in upper and lower respiratory specimens during the acute phase of infection. The lowest concentration of SARS-CoV-2 viral copies this assay can detect is 250 copies / mL. A negative result does not preclude SARS-CoV-2 infection and should not be used as the sole basis for treatment or other patient management decisions.  A negative result may occur with improper specimen collection / handling, submission of specimen other than nasopharyngeal swab, presence of viral mutation(s) within the areas targeted by this assay, and inadequate number of viral copies (<250 copies / mL). A negative result must be combined with clinical observations, patient history, and epidemiological information.  Fact Sheet for Patients:   RoadLapTop.co.za  Fact Sheet for Healthcare Providers: http://kim-miller.com/  This test is not yet approved or  cleared by the [place] FDA and has been authorized for detection and/or diagnosis of SARS-CoV-2 by FDA under an Emergency Use Authorization (EUA).  This EUA will remain in effect (meaning this test can be used) for the duration of the COVID-19 declaration under Section 564(b)(1) of the Act, 21  U.S.C. section 360bbb-3(b)(1), unless the authorization is terminated or revoked sooner.  Performed at [facility], [address] [place], [place] [address]     Labs: CBC: Recent Labs  Lab 01/21/23 1342 01/22/23 0303  WBC 10.8* 9.9  NEUTROABS  --  8.5*  HGB 13.8 12.2  HCT 40.6 36.4  MCV 94.6 94.5  PLT 276 238   Basic Metabolic Panel: Recent Labs  Lab 01/21/23 1342 01/22/23 0303  NA 137 138  K 3.8 4.3  CL 101 104  CO2 26 25  GLUCOSE 161* 176*  BUN 13 16  CREATININE 0.82 0.81  CALCIUM 9.3 8.9   Liver  Function Tests: Recent Labs  Lab 01/21/23 1342  AST 27  ALT 20  ALKPHOS 131*  BILITOT 0.4  PROT 7.4  ALBUMIN 3.6   CBG: No results for input(s): "GLUCAP" in the last 168 hours.  Discharge time spent: greater than 30 minutes.  This record has been created using Conservation officer, historic buildings. Errors have been sought and corrected,but may not always be located. Such creation errors do not reflect on the standard of care.   Signed: Arnetha Courser, MD [facility] 01/23/2023

## 2023-01-24 NOTE — Telephone Encounter (Signed)
Requested medication (s) are due for refill today: yes  Requested medication (s) are on the active medication list: yes  Last refill:  01/17/22 #90  Future visit scheduled:no}  Notes to clinic:  overdue lab work    Requested Prescriptions  Pending Prescriptions Disp Refills   atorvastatin (LIPITOR) 80 MG tablet [Pharmacy Med Name: ATORVASTATIN 80 MG TABLET] 90 tablet 0    Sig: Take 1 tablet (80 mg total) by mouth at bedtime.     Cardiovascular:  Antilipid - Statins Failed - 01/24/2023  2:20 PM      Failed - Lipid Panel in normal range within the last 12 months    Cholesterol  Date Value Ref Range Status  06/23/2020 179 <200 mg/dL Final   LDL Cholesterol (Calc)  Date Value Ref Range Status  06/23/2020 94 mg/dL (calc) Final    Comment:    Reference range: <100 . Desirable range <100 mg/dL for primary prevention;   <70 mg/dL for patients with CHD or diabetic patients  with > or = 2 CHD risk factors. Marland Kitchen LDL-C is now calculated using the Martin-Hopkins  calculation, which is a validated novel method providing  better accuracy than the Friedewald equation in the  estimation of LDL-C.  Horald Pollen et al. Lenox Ahr. 9604;540(98): 2061-2068  (http://education.[facility].com/faq/FAQ164)    HDL  Date Value Ref Range Status  06/23/2020 35 (L) > OR = 50 mg/dL Final   Triglycerides  Date Value Ref Range Status  06/23/2020 377 (H) <150 mg/dL Final    Comment:    . If a non-fasting specimen was collected, consider repeat triglyceride testing on a fasting specimen if clinically indicated.  Perry Mount et al. J. of Clin. Lipidol. 2015;9:129-169. Marland Kitchen          Passed - Patient is not pregnant      Passed - Valid encounter within last 12 months    Recent Outpatient Visits           2 months ago Essential hypertension   [facility] Margarita Mail, DO   3 months ago Essential hypertension   [place] Della Goo F,  FNP   4 months ago Moderate episode of recurrent major depressive disorder [facility])   [facility] Margarita Mail, DO   1 year ago Other fatigue   [facility] Margarita Mail, DO   1 year ago Ambulatory dysfunction   [name] Margarita Mail, DO       Future Appointments             In 1 week Evelene Croon, Atilano Median, MD [facility]   In 3 months Margarita Mail, DO [facility]

## 2023-01-26 LAB — CULTURE, BLOOD (ROUTINE X 2)
Culture: NO GROWTH
Culture: NO GROWTH
Special Requests: ADEQUATE

## 2023-01-26 NOTE — Telephone Encounter (Signed)
Was sent to wrong office

## 2023-02-02 NOTE — Transitions of Care (Post Inpatient/ED Visit) (Signed)
   02/02/2023  Name: Samantha [name] MRN: [id number] DOB: Jun 13, 1928  Today's TOC FU Call Status: Today's TOC FU Call Status:: Unsuccessful Call (2nd Attempt) Unsuccessful Call (1st Attempt) Date: 01/23/23 Unsuccessful Call (2nd Attempt) Date: 02/02/23  Attempted to reach the patient regarding the most recent Inpatient/ED visit.  Follow Up Plan: Additional outreach attempts will be made to reach the patient to complete the Transitions of Care (Post Inpatient/ED visit) call.   Signature Julian Lemmings, LPN [facility] Nurse Health Advisor Direct Dial [phone]

## 2023-02-02 NOTE — Transitions of Care (Post Inpatient/ED Visit) (Signed)
   02/02/2023  Name: Samantha [name] MRN: [id number] DOB: [birth year]  Today's TOC FU Call Status: Today's TOC FU Call Status:: Unsuccessful Call (3rd Attempt) Unsuccessful Call (1st Attempt) Date: 01/23/23 Unsuccessful Call (2nd Attempt) Date: 02/02/23 Unsuccessful Call (3rd Attempt) Date: 02/02/23  Attempted to reach the patient regarding the most recent Inpatient/ED visit.  Follow Up Plan: No further outreach attempts will be made at this time. We have been unable to contact the patient.  Signature Julian Lemmings, LPN [facility] Nurse Health Advisor Direct Dial [phone]

## 2023-02-03 ENCOUNTER — Ambulatory Visit (INDEPENDENT_AMBULATORY_CARE_PROVIDER_SITE_OTHER): Payer: Medicare HMO | Admitting: Pediatrics

## 2023-02-03 ENCOUNTER — Encounter: Payer: Self-pay | Admitting: Pediatrics

## 2023-02-03 ENCOUNTER — Ambulatory Visit: Payer: Self-pay

## 2023-02-03 VITALS — BP 171/78 | HR 68 | Temp 98.1°F | Resp 16 | Wt 161.8 lb

## 2023-02-03 DIAGNOSIS — M25512 Pain in left shoulder: Secondary | ICD-10-CM

## 2023-02-03 DIAGNOSIS — H9193 Unspecified hearing loss, bilateral: Secondary | ICD-10-CM

## 2023-02-03 DIAGNOSIS — I10 Essential (primary) hypertension: Secondary | ICD-10-CM | POA: Diagnosis not present

## 2023-02-03 DIAGNOSIS — I480 Paroxysmal atrial fibrillation: Secondary | ICD-10-CM

## 2023-02-03 DIAGNOSIS — Z1330 Encounter for screening examination for mental health and behavioral disorders, unspecified: Secondary | ICD-10-CM

## 2023-02-03 DIAGNOSIS — Z7689 Persons encountering health services in other specified circumstances: Secondary | ICD-10-CM

## 2023-02-03 DIAGNOSIS — Z09 Encounter for follow-up examination after completed treatment for conditions other than malignant neoplasm: Secondary | ICD-10-CM | POA: Diagnosis not present

## 2023-02-03 NOTE — Telephone Encounter (Signed)
 Chief Complaint: Information only Symptoms: Dislocated Shoulder    Disposition: [] ED /[] Urgent Care (no appt availability in office) / [] Appointment(In office/virtual)/ []  [facility] [place] [facility]/ [] Home Care/ [] Refused Recommended Disposition /[] [place] [facility]/ [x]  Follow-up with PCP Additional Notes: Patient was seen in office today by PCp and was referred to [facility] on [address] for further evaluation and possible intervention today. Patient stated when she arrived at the location and waited for 30 minutes she was told that they do not have the medication needed in office to fix her shoulder and she should go to the ED. Patient refuses to go the the ED. Agent reached out to office and was told they would would make provider aware. Offered patient the [facility] day injury clinic information and patient stated her driver is not allowed to drive outside of [place] due to her age. Patient is asking what she should do now. Care advice given and ED advised again but patient refused and stated she was going home now and wants to PCP to be aware.   Reason for Disposition  [1] Follow-up call to recent contact AND [2] information only call, no triage required  Answer Assessment - Initial Assessment Questions 1. REASON FOR CALL or QUESTION: What is your reason for calling today? or How can I best help you? or What question do you have that I can help answer?     [facility] can't help me, what am I suppose to do?  Protocols used: Information Only Call - No Triage-A-AH
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
## 2023-02-03 NOTE — Assessment & Plan Note (Signed)
 Difficulty hearing with current hearing aid, which is not fitting properly. Patient plans to have it looked at by the [facility] hospital. -Encourage patient to follow up with [facility] hospital for hearing aid adjustment.

## 2023-02-03 NOTE — Assessment & Plan Note (Addendum)
 Elevated blood pressure noted during visit, possibly due to pain and anxiety. Pt  -Continue current antihypertensive medications. -Recheck blood pressure in 2-3 weeks.

## 2023-02-03 NOTE — Assessment & Plan Note (Signed)
 Patient is on anticoagulation therapy (Eliquis) for atrial fibrillation. Continue diltiazam and metoprolol. Follows with cardiology. -Continue Eliquis as prescribed.

## 2023-02-03 NOTE — Patient Instructions (Signed)

## 2023-02-04 LAB — CBC
Hematocrit: 37.4 % (ref 34.0–46.6)
Hemoglobin: 12.1 g/dL (ref 11.1–15.9)
MCH: 31.2 pg (ref 26.6–33.0)
MCHC: 32.4 g/dL (ref 31.5–35.7)
MCV: 96 fL (ref 79–97)
Platelets: 311 10*3/uL (ref 150–450)
RBC: 3.88 x10E6/uL (ref 3.77–5.28)
RDW: 13.1 % (ref 11.7–15.4)
WBC: 14.6 10*3/uL — ABNORMAL HIGH (ref 3.4–10.8)

## 2023-02-04 LAB — BASIC METABOLIC PANEL
BUN/Creatinine Ratio: 20 (ref 12–28)
BUN: 15 mg/dL (ref 10–36)
CO2: 25 mmol/L (ref 20–29)
Calcium: 9.6 mg/dL (ref 8.7–10.3)
Chloride: 105 mmol/L (ref 96–106)
Creatinine, Ser: 0.76 mg/dL (ref 0.57–1.00)
Glucose: 138 mg/dL — ABNORMAL HIGH (ref 70–99)
Potassium: 4.3 mmol/L (ref 3.5–5.2)
Sodium: 144 mmol/L (ref 134–144)
eGFR: 73 mL/min/{1.73_m2} (ref >59)

## 2023-02-07 ENCOUNTER — Other Ambulatory Visit: Payer: Self-pay

## 2023-02-07 ENCOUNTER — Emergency Department: Payer: Medicare HMO

## 2023-02-07 ENCOUNTER — Emergency Department
Admission: EM | Admit: 2023-02-07 | Discharge: 2023-02-07 | Disposition: A | Discharge status: Home / Self Care | Payer: Medicare HMO | Attending: Student in an Organized Health Care Education/Training Program | Admitting: Student in an Organized Health Care Education/Training Program

## 2023-02-07 DIAGNOSIS — M25512 Pain in left shoulder: Secondary | ICD-10-CM | POA: Diagnosis not present

## 2023-02-07 DIAGNOSIS — S2242XA Multiple fractures of ribs, left side, initial encounter for closed fracture: Secondary | ICD-10-CM | POA: Diagnosis not present

## 2023-02-07 DIAGNOSIS — M85812 Other specified disorders of bone density and structure, left shoulder: Secondary | ICD-10-CM | POA: Diagnosis not present

## 2023-02-07 DIAGNOSIS — M19012 Primary osteoarthritis, left shoulder: Secondary | ICD-10-CM | POA: Diagnosis not present

## 2023-02-07 MED ORDER — LIDOCAINE 5 % EX PTCH
1.0000 | MEDICATED_PATCH | CUTANEOUS | Status: DC
  Administered 2023-02-07: 1 via TRANSDERMAL
  Filled 2023-02-07: qty 1

## 2023-02-07 MED ORDER — LIDOCAINE 5 % EX PTCH: 1.0000 | MEDICATED_PATCH | Freq: Two times a day (BID) | CUTANEOUS | 0 refills | Status: DC

## 2023-02-07 NOTE — Telephone Encounter (Signed)
 Patient is now checked into ED. Provider aware.

## 2023-02-07 NOTE — ED Provider Notes (Signed)
 [facility] Provider Note    Event Date/Time   First MD Initiated Contact with Patient 02/07/23 1756     (approximate)   History   Shoulder Injury   HPI  Samantha [name] is a 89 y.o. female [place] to the ER for evaluation of left shoulder pain for the past several days.  States that she was told she may have dislocated it after she was brought to the ER by EMS several days ago.  States that she is having persistent pain at that site and followed up with her PCP and was told at that clinic that she may have dislocated her shoulder.  No imaging was done at that time she followed up with orthopedics [facility] but did not have any additional imaging done was referred to the ER.  She denies any other associated injury.  It is worse with palpation of the superior aspect of the left shoulder and worsen with movement.  Denies any numbness or tingling distally.  No chest pain or shortness of breath.  No chest wall tenderness.     Physical Exam   Triage Vital Signs: ED Triage Vitals  Encounter Vitals Group     BP 02/07/23 1357 (!) 197/108     Systolic BP Percentile --      Diastolic BP Percentile --      Pulse Rate 02/07/23 1355 80     Resp 02/07/23 1355 17     Temp 02/07/23 1355 98.3 F (36.8 C)     Temp src --      SpO2 02/07/23 1355 96 %     Weight 02/07/23 1355 160 lb 15 oz (73 kg)     Height 02/07/23 1355 5' (1.524 m)     Head Circumference --      Peak Flow --      Pain Score 02/07/23 1355 9     Pain Loc --      Pain Education --      Exclude from Growth Chart --     Most recent vital signs: Vitals:   02/07/23 1355 02/07/23 1357  BP:  (!) 197/108  Pulse: 80   Resp: 17   Temp: 98.3 F (36.8 C)   SpO2: 96%      Constitutional: Alert  Eyes: Conjunctivae are normal.  Head: Atraumatic. Nose: No congestion/rhinnorhea. Mouth/Throat: Mucous membranes are moist.   Neck: Painless ROM.  Cardiovascular:   Good peripheral  circulation. Respiratory: Normal respiratory effort.  No retractions.  Gastrointestinal: Soft and nontender.  Musculoskeletal:  no deformity.  There is pain with palpation of the posterior and superior aspect of the left shoulder.  No step-off or deformity of the cervical spine.  No C-spine tenderness.  No overlying warmth or erythema.  No crepitus.  Chest wall is stable to compression and nontender. Neurologic:  MAE spontaneously. No gross focal neurologic deficits are appreciated.  Skin:  Skin is warm, dry and intact. No rash noted. Psychiatric: Mood and affect are normal. Speech and behavior are normal.    ED Results / Procedures / Treatments   Labs (all labs ordered are listed, but only abnormal results are displayed) Labs Reviewed - No data to display   EKG     RADIOLOGY Please see ED Course for my review and interpretation.  I personally reviewed all radiographic images ordered to evaluate for the above acute complaints and reviewed radiology reports and findings.  These findings were personally discussed with the patient.  Please  see medical record for radiology report.    PROCEDURES:  Critical Care performed:   Procedures   MEDICATIONS ORDERED IN ED: Medications  lidocaine  (LIDODERM ) 5 % 1 patch (has no administration in time range)     IMPRESSION / MDM / ASSESSMENT AND PLAN / ED COURSE  I reviewed the triage vital signs and the nursing notes.                              Differential diagnosis includes, but is not limited to, fracture, dislocation, ligamentous injury, rotator cuff injury, capsulitis septic arthritis  Patient presented to the ER for evaluation of left shoulder pain as described above.  X-ray on my review and interpretation does show osteopenia without evidence of dislocation or fracture.  Pain is reproducible with pain and seems to be isolated to the shoulder is not consistent with septic arthritis.  She denies any chest pain.  Has had some  improvement with Tylenol .  Does not seem consistent with cardiac or pulmonary referred pain.  She does appear stable and appropriate for outpatient follow-up.       FINAL CLINICAL IMPRESSION(S) / ED DIAGNOSES   Final diagnoses:  Acute pain of left shoulder     Rx / DC Orders   ED Discharge Orders          Ordered    lidocaine  (LIDODERM ) 5 %  Every 12 hours        02/07/23 1808             Note:  This document was prepared using Dragon voice recognition software and may include unintentional dictation errors.    Lang Dover, MD 02/07/23 [phone]

## 2023-02-07 NOTE — ED Provider Triage Note (Signed)
 Emergency Medicine Provider Triage Evaluation Note  Samantha [name] , a 89 y.o. female  was evaluated in triage.  Pt complains of left shoulder pain since 01/20/2023. She feels that it may have happened when EMS was helping her walk. She believes it is dislocated. She went to ortho clinic, but they didn't have the medicine they needed to put it back in so they went home. Pain continues  Physical Exam  BP (!) 197/108   Pulse 80   Temp 98.3 F (36.8 C)   Resp 17   Ht 5' (1.524 m)   Wt 73 kg   SpO2 96%   BMI 31.43 kg/m  Gen:   Awake, no distress   Resp:  Normal effort  MSK:   Moves extremities without difficulty  Other:    Medical Decision Making  Medically screening exam initiated at 2:24 PM.  Appropriate orders placed.  [name] was informed that the remainder of the evaluation will be completed by another provider, this initial triage assessment does not replace that evaluation, and the importance of remaining in the ED until their evaluation is complete.    Samantha Kirk NOVAK, FNP 02/07/23 1553
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
## 2023-02-08 ENCOUNTER — Encounter: Payer: Self-pay | Admitting: Pediatrics

## 2023-02-08 ENCOUNTER — Ambulatory Visit: Payer: Self-pay | Admitting: *Deleted

## 2023-02-08 ENCOUNTER — Ambulatory Visit (INDEPENDENT_AMBULATORY_CARE_PROVIDER_SITE_OTHER): Payer: Medicare HMO | Admitting: Pediatrics

## 2023-02-08 VITALS — BP 147/78 | HR 96 | Temp 98.6°F | Resp 17 | Ht 60.0 in | Wt 144.6 lb

## 2023-02-08 DIAGNOSIS — Z1330 Encounter for screening examination for mental health and behavioral disorders, unspecified: Secondary | ICD-10-CM

## 2023-02-08 DIAGNOSIS — I10 Essential (primary) hypertension: Secondary | ICD-10-CM | POA: Diagnosis not present

## 2023-02-08 DIAGNOSIS — I480 Paroxysmal atrial fibrillation: Secondary | ICD-10-CM | POA: Diagnosis not present

## 2023-02-08 DIAGNOSIS — M25512 Pain in left shoulder: Secondary | ICD-10-CM

## 2023-02-08 DIAGNOSIS — Z79899 Other long term (current) drug therapy: Secondary | ICD-10-CM

## 2023-02-08 NOTE — Progress Notes (Signed)
 Office Visit  BP (!) 147/78 (BP Location: Left Arm, Patient Position: Sitting, Cuff Size: Normal)   Pulse 96   Temp 98.6 F (37 C) (Oral)   Resp 17   Ht 5' (1.524 m)   Wt 144 lb 9.6 oz (65.6 kg)   SpO2 96%   BMI 28.24 kg/m    Subjective:    Patient ID: Samantha [name], female    DOB: May 27, 1928, 89 y.o.   MRN: [id number]  HPI: Samantha [name] is a 89 y.o. female  Chief Complaint  Patient presents with   Follow-up    Doesn't feel that her shoulder is any better. Also needs to review her meds and ensure she is taking what she should be. Concern for diltiazem  (CARDIZEM  CD) 300 MG 24 hr capsule.      #medication review Patient notes some confusion over current regimen She stopped taking diltiazem  3 days ago on her own, she found a letter saying she should stop it No chest pain, sob, palpations, orthopnea, dyspnea, PND, lower extremity edema.  #shoulder pain Patient with ongoing left shoulder pain She was seen in ER, no dislocation on imaging Gave her lidocaine  patches, not working Still unable to lift arm much due to pain  Relevant past medical, surgical, family and social history reviewed and updated as indicated. Interim medical history since our last visit reviewed. Allergies and medications reviewed and updated.  ROS per HPI unless specifically indicated above     Objective:    BP (!) 147/78 (BP Location: Left Arm, Patient Position: Sitting, Cuff Size: Normal)   Pulse 96   Temp 98.6 F (37 C) (Oral)   Resp 17   Ht 5' (1.524 m)   Wt 144 lb 9.6 oz (65.6 kg)   SpO2 96%   BMI 28.24 kg/m   Wt Readings from Last 3 Encounters:  02/08/23 144 lb 9.6 oz (65.6 kg)  02/07/23 160 lb 15 oz (73 kg)  02/03/23 161 lb 12.8 oz (73.4 kg)     Physical Exam Constitutional:      Appearance: Normal appearance.  HENT:     Head: Normocephalic and atraumatic.  Eyes:     Pupils: Pupils are equal, round, and reactive to light.  Cardiovascular:     Rate and Rhythm: Normal  rate and regular rhythm.     Pulses: Normal pulses.     Heart sounds: Normal heart sounds.  Pulmonary:     Effort: Pulmonary effort is normal.     Breath sounds: Normal breath sounds.  Musculoskeletal:     Right shoulder: Normal.     Left shoulder: Tenderness and bony tenderness present. No effusion. Decreased range of motion.     Cervical back: Normal range of motion.  Skin:    General: Skin is warm and dry.     Capillary Refill: Capillary refill takes less than 2 seconds.  Neurological:     General: No focal deficit present.     Mental Status: She is alert. Mental status is at baseline.  Psychiatric:        Mood and Affect: Mood normal.        Behavior: Behavior normal.         02/08/2023    2:03 PM 02/03/2023    2:12 PM 10/06/2022   11:57 AM 09/06/2022    2:36 PM 04/08/2021    2:25 PM  Depression screen PHQ 2/9  Decreased Interest 2 1 0 3 3  Down, Depressed, Hopeless 2 1 0  3 3  PHQ - 2 Score 4 2 0 6 6  Altered sleeping 2 1  3    Tired, decreased energy 2 1  3 3   Change in appetite 2 1  3 2   Feeling bad or failure about yourself  1 0  3 0  Trouble concentrating 2 1  3  0  Moving slowly or fidgety/restless 2 1  3  0  Suicidal thoughts 1 1  3  0  PHQ-9 Score 16 8  27    Difficult doing work/chores Somewhat difficult Somewhat difficult  Extremely dIfficult Somewhat difficult       02/08/2023    2:03 PM 02/03/2023    2:12 PM 09/06/2022    2:46 PM 07/07/2020    2:07 PM  GAD 7 : Generalized Anxiety Score  Nervous, Anxious, on Edge 1 1 3  0  Control/stop worrying 1 1 3  0  Worry too much - different things 1 1 3  0  Trouble relaxing 1 1 3  0  Restless 1 1 3  0  Easily annoyed or irritable 1 0 3 0  Afraid - awful might happen 1 1 3  0  Total GAD 7 Score 7 6 21  0  Anxiety Difficulty Somewhat difficult Not difficult at all Extremely difficult Not difficult at all       Assessment & Plan:  Assessment & Plan   Polypharmacy Assessment & Plan: Will have our clinical pharmacy help with  reconciliation and pill pack.   Acute pain of left shoulder Assessment & Plan: R/o dislocation and fracture. Suspect spray or other shoulder tendinoplasty. Given lidocaine  patch in ER not working. Plan voltaren and pain management with rehab exercises and tylenol .    Essential hypertension Assessment & Plan: Pt w improvement in readings today. She stopped taking lisinopril /hydrochlorothiazide  combo after recent admission. Additionally, stopped taking diltiazem  on her own. Has not noted any symptomatic changes. Well appearing today. Plan to monitor off the two medications and re-evaluate in a few weeks.   Paroxysmal atrial fibrillation [facility]) Assessment & Plan: Stopped diltiazem  on her own. She was placed on this back in August 2024 when she had episode of afib w RVR. I don't see any notes recommending discontinuation. Follows with cardiology. Asymptomatic. Continue metoprolol  and eliquis . Can continue to monitor off this medication, encouraged to reach out to cardiology to move up follow up visit.   Encounter for behavioral health screening As part of their intake evaluation, the patient was screened for depression, anxiety.  PHQ9 SCORE 16, GAD7 SCORE 7. Screening results positive for tested conditions. On Prozac  40mg  continue to monitor.   Follow up plan: No follow-ups on file.  Savannha Welle SHAUNNA NETT, MD

## 2023-02-08 NOTE — Telephone Encounter (Signed)
 Reason for Disposition  [1] MODERATE pain (e.g., interferes with normal activities) AND [2] present > 3 days  Answer Assessment - Initial Assessment Questions 1. ONSET: When did the pain start?     She saw Dr. Herold on 01/03/2023.   Said she had a dislocated shoulder.   Went to ED but they said she did not have a dislocated shoulder.    ED gave her a pain patch.     She is so confused about her medications.   I need to bring her in to get these medications.    175/90 something. No headaches. 2. LOCATION: Where is the pain located?     Left shoulder 3. PAIN: How bad is the pain? (Scale 1-10; or mild, moderate, severe)   - MILD (1-3): doesn't interfere with normal activities   - MODERATE (4-7): interferes with normal activities (e.g., work or school) or awakens from sleep   - SEVERE (8-10): excruciating pain, unable to do any normal activities, unable to move arm at all due to pain     Moderate pain.    I don't know why she is in so much pain with that shoulder 4. WORK OR EXERCISE: Has there been any recent work or exercise that involved this part of the body?     No 5. CAUSE: What do you think is causing the shoulder pain?     I don't know 6. OTHER SYMPTOMS: Do you have any other symptoms? (e.g., neck pain, swelling, rash, fever, numbness, weakness)     BP is elevated too 7. PREGNANCY: Is there any chance you are pregnant? When was your last menstrual period?     N/A due to age  Protocols used: Shoulder Pain-A-AH

## 2023-02-08 NOTE — Telephone Encounter (Signed)
  Chief Complaint: shoulder pain, medications, elevated bp and lab results   Sister Larry Margo called in.  Symptoms: shoulder pain Frequency: daily Pertinent Negatives: Patient denies injuries Disposition: [] ED /[] Urgent Care (no appt availability in office) / [x] Appointment(In office/virtual)/ []  [place] [facility]/ [] Home Care/ [] Refused Recommended Disposition /[] [place] [facility]/ []  Follow-up with PCP Additional Notes: Larry wants to discuss Breasia' medications, lab work results, pain she's having in shoulder, and elevated BP.    Appt made for today at 2:00 with Dr. Herold.

## 2023-02-08 NOTE — Patient Instructions (Addendum)
 For pain - take tylenol  50mmg (extra strength) 2-3 x a day, space them out every 6-8 hours - over the counter voltaren (diclofenac gel) every 4-6 hours applied to the area - Please rest and ice your shoulder.   As your pain improves, please do the shoulder exercises.  Your medications: albuterol  108 (90 Base) MCG/ACT inhaler Commonly known as: VENTOLIN  HFA Inhale 2 puffs into the lungs every 6 (six) hours as needed for wheezing or shortness of breath.    amoxicillin -clavulanate 875-125 MG tablet Commonly known as: AUGMENTIN  Take 1 tablet by mouth 2 (two) times daily for 3 days.    atorvastatin  80 MG tablet Commonly known as: LIPITOR Take 1 tablet (80 mg total) by mouth at bedtime.    Cholecalciferol  25 MCG (1000 UT) tablet Take 1,000 Units by mouth daily.       Eliquis  2.5 MG Tabs tablet Generic drug: apixaban  Take 1 tablet (2.5 mg total) by mouth 2 (two) times daily.    FLUoxetine  40 MG capsule Commonly known as: PROZAC  Take 1 capsule (40 mg total) by mouth daily.    isosorbide  mononitrate 60 MG 24 hr tablet Commonly known as: IMDUR  Take 1 tablet (60 mg total) by mouth daily.    metoprolol  succinate 25 MG 24 hr tablet Commonly known as: TOPROL -XL Take 1 tablet (25 mg total) by mouth daily.

## 2023-02-10 DIAGNOSIS — M25512 Pain in left shoulder: Secondary | ICD-10-CM | POA: Insufficient documentation

## 2023-02-10 DIAGNOSIS — Z79899 Other long term (current) drug therapy: Secondary | ICD-10-CM | POA: Insufficient documentation

## 2023-02-12 NOTE — Assessment & Plan Note (Signed)
 Will have our clinical pharmacy help with reconciliation and pill pack.

## 2023-02-12 NOTE — Assessment & Plan Note (Signed)
 R/o dislocation and fracture. Given lidocaine patch in ER not working. Plan voltaren and pain management with rehab exercises and tylenol.

## 2023-02-12 NOTE — Assessment & Plan Note (Signed)
 Stopped diltiazem  on her own. She was placed on this back in August 2024 when she had episode of afib w RVR. I don't see any notes recommending discontinuation. Follows with cardiology. Asymptomatic. Continue metoprolol  and eliquis . Can continue to monitor off this medication, encouraged to reach out to cardiology to move up follow up visit.

## 2023-02-12 NOTE — Assessment & Plan Note (Signed)
 Pt w improvement in readings today. She stopped taking lisinopril /hydrochlorothiazide  combo after recent admission. Additionally, stopped taking diltiazem  on her own. Has not noted any symptomatic changes. Well appearing today. Plan to monitor off the two medications and re-evaluate in a few weeks.

## 2023-02-13 ENCOUNTER — Telehealth: Payer: Self-pay

## 2023-02-13 NOTE — Progress Notes (Signed)
 Care Guide Pharmacy Note  02/13/2023 Name: RAHAF CARBONELL MRN: [id number] DOB: 09/27/1928  Referred By: Herold Hadassah SQUIBB, MD Reason for referral: Care Coordination (Outreach to schedule with Pharm d )   Samantha [name] is a 89 y.o. year old female who is a primary care patient of Herold Hadassah SQUIBB, MD.  Cathlean FORBES Lybbert was referred to the pharmacist for assistance related to: HTN and HLD  Successful contact was made with the patient to discuss pharmacy services including being ready for the pharmacist to call at least 5 minutes before the scheduled appointment time and to have medication bottles and any blood pressure readings ready for review. The patient agreed to meet with the pharmacist via telephone visit on (date/time).02/17/2023  Jeoffrey Buffalo , RMA     [place]  [facility], [facility] Guide  Direct Dial: [phone]  Website: [email or web address]

## 2023-02-16 ENCOUNTER — Other Ambulatory Visit: Payer: Self-pay

## 2023-02-17 NOTE — Progress Notes (Signed)
   02/17/2023 Name: Samantha [name] MRN: [id number] DOB: 02-10-1928  Chief Complaint  Patient presents with   Medication Management   Samantha [name] is a 89 y.o. year old female who presented for a telephone visit.   They were referred to the pharmacist by their PCP for assistance in managing complex medication management.   Subjective:  Care Team: Primary Care Provider: Jackolyn Confer, MD ; Next Scheduled Visit: 02/24/23  Medication Access/Adherence  Current Pharmacy:  [facility] - [place], [place] - [address] [place] [address] Phone: [phone] Fax: [phone]  -Patient reports affordability concerns with their medications: No  -Patient reports access/transportation concerns to their pharmacy: Yes -however, patient receives home delivery of medications through [facility] -Patient reports adherence concerns with their medications:  No  -patient uses a weekly pill organizer and endorses good adherence to medication regimen  Hypertension: Current medications: Metoprolol XL 25 mg daily, isosorbide MN ER 60 mg daily -Patient has a validated, automated, upper arm home BP cuff -Current blood pressure readings readings: 156/91 -Patient denies hypotensive s/sx including dizziness, lightheadedness.  -Patient reports hypertensive symptoms including headache -Patient reports history of intracerebral aneurysm  Objective: Lab Results  Component Value Date   HGBA1C 5.7 (H) 05/08/2019   Lab Results  Component Value Date   CREATININE 0.76 02/03/2023   BUN 15 02/03/2023   NA 144 02/03/2023   K 4.3 02/03/2023   CL 105 02/03/2023   CO2 25 02/03/2023   Assessment/Plan:   Hypertension: -Currently uncontrolled -Recommend that patient contact primary care provider's office for recommendation/soonest appointment in regards to headache and elevated blood pressure -Report to the emergency department if symptoms worsen -If blood pressure remains  elevated (anything greater than 140/90 consistently) I would recommend the addition of another antihypertensive and/or increasing metoprolol to 50 mg daily  Lenna Gilford, PharmD, DPLA

## 2023-02-24 ENCOUNTER — Ambulatory Visit: Payer: Medicare HMO | Admitting: Pediatrics

## 2023-02-24 VITALS — BP 132/75 | HR 85 | Ht 62.0 in | Wt 143.0 lb

## 2023-02-24 DIAGNOSIS — R0982 Postnasal drip: Secondary | ICD-10-CM

## 2023-02-24 DIAGNOSIS — E785 Hyperlipidemia, unspecified: Secondary | ICD-10-CM | POA: Diagnosis not present

## 2023-02-24 DIAGNOSIS — Z1330 Encounter for screening examination for mental health and behavioral disorders, unspecified: Secondary | ICD-10-CM

## 2023-02-24 DIAGNOSIS — R5383 Other fatigue: Secondary | ICD-10-CM | POA: Diagnosis not present

## 2023-02-24 DIAGNOSIS — I480 Paroxysmal atrial fibrillation: Secondary | ICD-10-CM | POA: Diagnosis not present

## 2023-02-24 LAB — CBC
Hematocrit: 40.3 % (ref 34.0–46.6)
Hemoglobin: 13.3 g/dL (ref 11.1–15.9)
MCH: 30.9 pg (ref 26.6–33.0)
MCHC: 33 g/dL (ref 31.5–35.7)
MCV: 94 fL (ref 79–97)
Platelets: 296 10*3/uL (ref 150–450)
RBC: 4.3 x10E6/uL (ref 3.77–5.28)
RDW: 13.6 % (ref 11.7–15.4)
WBC: 7.7 10*3/uL (ref 3.4–10.8)

## 2023-02-24 MED ORDER — ATORVASTATIN CALCIUM 80 MG PO TABS: 80.0000 mg | ORAL_TABLET | Freq: Every day | ORAL | 0 refills | Status: DC

## 2023-02-24 MED ORDER — FLUTICASONE PROPIONATE 50 MCG/ACT NA SUSP: 2.0000 | Freq: Every day | NASAL | 6 refills | Status: AC

## 2023-02-24 NOTE — Progress Notes (Unsigned)
Office Visit  BP 132/75   Pulse 85   Ht 5\' 2"  (1.575 m)   Wt 143 lb (64.9 kg)   BMI 26.16 kg/m    Subjective:    Patient ID: Samantha [name], female    DOB: 11/25/1928, 89 y.o.   MRN: [id number]  HPI: Samantha [name] is a 89 y.o. female  Chief Complaint  Patient presents with   Medical Management of Chronic Issues    Discussed the use of AI scribe software for clinical note transcription with the patient, who gave verbal consent to proceed.  History of Present Illness   The patient, with a history of atrial fibrillation and a stent placement, presents with persistent fatigue and a chronic cough. The fatigue is described as overwhelming, leading to an inability to perform daily activities due to a sensation of complete energy drain. The patient reports sleeping for extended periods during the day, often up to 16-17 hours, and experiencing difficulty sleeping at night. This pattern has been ongoing for a significant period.  The patient also reports a chronic cough, described as deep and intermittent, which has been present for an extended period. The patient has been using over-the-counter cough medications with limited success. There is no reported swelling or other associated symptoms.  The patient also reports persistent right arm weakness, particularly when attempting to lift objects. This has been slowly improving over time. The patient has been using diclofenac gel for this issue, but it is unclear if this has been beneficial.  The patient's medications include atorvastatin, metoprolol, and an inhaler, which was difficult to use during a recent episode of shortness of breath. The patient also reports a dry throat, despite adequate hydration.  The patient's last cardiology appointment was in October, and there is a report of fluid in the lungs from an X-ray taken a month ago. The patient has not been engaging in prescribed exercises due to the arm weakness and overwhelming  fatigue.     Relevant past medical, surgical, family and social history reviewed and updated as indicated. Interim medical history since our last visit reviewed. Allergies and medications reviewed and updated.  ROS per HPI unless specifically indicated above     Objective:    BP 132/75   Pulse 85   Ht 5\' 2"  (1.575 m)   Wt 143 lb (64.9 kg)   BMI 26.16 kg/m   Wt Readings from Last 3 Encounters:  02/24/23 143 lb (64.9 kg)  02/08/23 144 lb 9.6 oz (65.6 kg)  02/07/23 160 lb 15 oz (73 kg)     Physical Exam Constitutional:      Appearance: Normal appearance.  HENT:     Head: Normocephalic and atraumatic.  Eyes:     Pupils: Pupils are equal, round, and reactive to light.  Cardiovascular:     Rate and Rhythm: Normal rate and regular rhythm.     Pulses: Normal pulses.     Heart sounds: Normal heart sounds.  Pulmonary:     Effort: Pulmonary effort is normal.     Breath sounds: Normal breath sounds.  Abdominal:     General: Abdomen is flat.     Palpations: Abdomen is soft.  Musculoskeletal:        General: Normal range of motion.     Cervical back: Normal range of motion.  Skin:    General: Skin is warm and dry.     Capillary Refill: Capillary refill takes less than 2 seconds.  Neurological:  General: No focal deficit present.     Mental Status: She is alert. Mental status is at baseline.  Psychiatric:        Mood and Affect: Mood normal.        Behavior: Behavior normal.         02/24/2023   12:59 PM 02/08/2023    2:03 PM 02/03/2023    2:12 PM 10/06/2022   11:57 AM 09/06/2022    2:36 PM  Depression screen PHQ 2/9  Decreased Interest 3 2 1  0 3  Down, Depressed, Hopeless 3 2 1  0 3  PHQ - 2 Score 6 4 2  0 6  Altered sleeping 3 2 1  3   Tired, decreased energy 3 2 1  3   Change in appetite 3 2 1  3   Feeling bad or failure about yourself  3 1 0  3  Trouble concentrating 3 2 1  3   Moving slowly or fidgety/restless 3 2 1  3   Suicidal thoughts 1 1 1  3   PHQ-9 Score 25 16 8   27   Difficult doing work/chores Very difficult Somewhat difficult Somewhat difficult  Extremely dIfficult       02/24/2023    1:00 PM 02/08/2023    2:03 PM 02/03/2023    2:12 PM 09/06/2022    2:46 PM  GAD 7 : Generalized Anxiety Score  Nervous, Anxious, on Edge 3 1 1 3   Control/stop worrying 3 1 1 3   Worry too much - different things 3 1 1 3   Trouble relaxing 3 1 1 3   Restless 0 1 1 3   Easily annoyed or irritable 0 1 0 3  Afraid - awful might happen 1 1 1 3   Total GAD 7 Score 13 7 6 21   Anxiety Difficulty Very difficult Somewhat difficult Not difficult at all Extremely difficult       Assessment & Plan:  Assessment & Plan   Other fatigue Paroxysmal atrial fibrillation (HCC) Assessment & Plan: Reports excessive daytime sleepiness and difficulty staying awake during the day. No change in fatigue levels after stopping diltiazem though poor historian with medication changes timeline. In reviewing the papers she uses as reference for her medications, she should have stopped imdur not diltiazem. Reports occasional shortness of breath. No recent cardiology follow-up noted. Suspect fatigue/sob multifactorial though I do worry about her rates at home and/or questionable orthopnea/PND symptoms as well. Will get blood work below, will expedite cardiology follow up to help with med rec as unclear why she discontinued diltiazem. Pt given strict return precautions and anticipatory guidance for ED. -Normal EKG in office today. NSR. -Refer to cardiology for further evaluation and management. -Consider adjustment of metoprolol dose after cardiology consultation. -Consider sleep apnea as a potential cause and discuss further evaluation if symptoms persist.  Orders: -     CBC -     Comprehensive metabolic panel -     Magnesium -     TSH -     Brain natriuretic peptide -     EKG 12-Lead  Dyslipidemia Requesting refills. -     Atorvastatin Calcium; Take 1 tablet (80 mg total) by mouth at bedtime.   Dispense: 90 tablet; Refill: 0  Postnasal drip Longstanding cough, possibly related to dry throat and postnasal drip. Given ongoing for months, do not suspect related to above. -Advise use of Tessalon Perles and Mucinex as needed. -Recommend trial of Flonase nasal spray to alleviate potential postnasal drip. -     Fluticasone Propionate;  Place 2 sprays into both nostrils daily.  Dispense: 16 g; Refill: 6  Encounter for behavioral health screening As part of their intake evaluation, the patient was screened for depression, anxiety.  PHQ9 SCORE 25, GAD7 SCORE 17. Screening results positive for tested conditions. Declines medication adjustments at this time. Continue prozac 40mg .  Follow up plan: Return in about 3 months (around 05/25/2023) for HTN, Chronic illness f/u.  Garland Hincapie Howell Pringle, MD

## 2023-02-24 NOTE — Patient Instructions (Addendum)
I will call you for your next appointment, I want to touch base with cardiology first For now continue your current medications

## 2023-02-25 LAB — TSH: TSH: 3.09 u[IU]/mL (ref 0.450–4.500)

## 2023-02-25 LAB — COMPREHENSIVE METABOLIC PANEL
ALT: 16 [IU]/L (ref 0–32)
AST: 19 [IU]/L (ref 0–40)
Albumin: 3.6 g/dL (ref 3.6–4.6)
Alkaline Phosphatase: 124 [IU]/L — ABNORMAL HIGH (ref 44–121)
BUN/Creatinine Ratio: 13 (ref 12–28)
BUN: 11 mg/dL (ref 10–36)
Bilirubin Total: 0.4 mg/dL (ref 0.0–1.2)
CO2: 26 mmol/L (ref 20–29)
Calcium: 9.7 mg/dL (ref 8.7–10.3)
Chloride: 105 mmol/L (ref 96–106)
Creatinine, Ser: 0.83 mg/dL (ref 0.57–1.00)
Globulin, Total: 2.1 g/dL (ref 1.5–4.5)
Glucose: 99 mg/dL (ref 70–99)
Potassium: 3.6 mmol/L (ref 3.5–5.2)
Sodium: 147 mmol/L — ABNORMAL HIGH (ref 134–144)
Total Protein: 5.7 g/dL — ABNORMAL LOW (ref 6.0–8.5)
eGFR: 66 mL/min/{1.73_m2} (ref >59)

## 2023-02-25 LAB — BRAIN NATRIURETIC PEPTIDE: BNP: 176.5 pg/mL — ABNORMAL HIGH (ref 0.0–100.0)

## 2023-02-25 LAB — MAGNESIUM: Magnesium: 1.8 mg/dL (ref 1.6–2.3)

## 2023-02-28 ENCOUNTER — Telehealth: Payer: Self-pay

## 2023-02-28 ENCOUNTER — Other Ambulatory Visit: Payer: Self-pay | Admitting: Pediatrics

## 2023-02-28 DIAGNOSIS — R0602 Shortness of breath: Secondary | ICD-10-CM

## 2023-02-28 NOTE — Telephone Encounter (Signed)
Called to patient per provider. Wanted to inform since she is seeing cardiology tomorrow they we access her medication and changes that may or may not be needed.   If anything urgent is needed before that we can see her today but better to be seen by cardiology. Ok for [facility] to review.

## 2023-03-01 DIAGNOSIS — I480 Paroxysmal atrial fibrillation: Secondary | ICD-10-CM | POA: Diagnosis not present

## 2023-03-01 DIAGNOSIS — E7800 Pure hypercholesterolemia, unspecified: Secondary | ICD-10-CM | POA: Diagnosis not present

## 2023-03-01 DIAGNOSIS — I509 Heart failure, unspecified: Secondary | ICD-10-CM | POA: Diagnosis not present

## 2023-03-01 DIAGNOSIS — I10 Essential (primary) hypertension: Secondary | ICD-10-CM | POA: Diagnosis not present

## 2023-03-01 DIAGNOSIS — I2510 Atherosclerotic heart disease of native coronary artery without angina pectoris: Secondary | ICD-10-CM | POA: Diagnosis not present

## 2023-03-01 DIAGNOSIS — R519 Headache, unspecified: Secondary | ICD-10-CM | POA: Diagnosis not present

## 2023-03-02 ENCOUNTER — Encounter: Payer: Self-pay | Admitting: Pediatrics

## 2023-03-02 NOTE — Assessment & Plan Note (Signed)
Reports excessive daytime sleepiness and difficulty staying awake during the day. No change in fatigue levels after stopping diltiazem. In reviewing the papers she uses as reference for her medications, she should have stopped imdur not diltiazem. Reports occasional shortness of breath. No recent cardiology follow-up noted. Suspect fatigue/sob multifactorial though I do worry about her rates at home and/or questionable orthopnea/PND symptoms as well. Will get blood work below, will expedite cardiology follow up to help with med rec as unclear why she discontinued diltiazem. -Normal EKG in office today. NSR. -Refer to cardiology for further evaluation and management. -Consider adjustment of metoprolol dose after cardiology consultation. -Consider sleep apnea as a potential cause and discuss further evaluation if symptoms persist.

## 2023-03-20 DIAGNOSIS — R519 Headache, unspecified: Secondary | ICD-10-CM | POA: Diagnosis not present

## 2023-03-20 DIAGNOSIS — E7800 Pure hypercholesterolemia, unspecified: Secondary | ICD-10-CM | POA: Diagnosis not present

## 2023-03-20 DIAGNOSIS — R040 Epistaxis: Secondary | ICD-10-CM | POA: Diagnosis not present

## 2023-03-20 DIAGNOSIS — I10 Essential (primary) hypertension: Secondary | ICD-10-CM | POA: Diagnosis not present

## 2023-03-20 DIAGNOSIS — Z8673 Personal history of transient ischemic attack (TIA), and cerebral infarction without residual deficits: Secondary | ICD-10-CM | POA: Diagnosis not present

## 2023-03-20 DIAGNOSIS — I471 Supraventricular tachycardia, unspecified: Secondary | ICD-10-CM | POA: Diagnosis not present

## 2023-03-20 DIAGNOSIS — I2510 Atherosclerotic heart disease of native coronary artery without angina pectoris: Secondary | ICD-10-CM | POA: Diagnosis not present

## 2023-03-20 DIAGNOSIS — I480 Paroxysmal atrial fibrillation: Secondary | ICD-10-CM | POA: Diagnosis not present

## 2023-03-25 ENCOUNTER — Encounter: Payer: Self-pay | Admitting: Emergency Medicine

## 2023-03-25 ENCOUNTER — Other Ambulatory Visit: Payer: Self-pay

## 2023-03-25 ENCOUNTER — Emergency Department
Admission: EM | Admit: 2023-03-25 | Discharge: 2023-03-25 | Disposition: A | Discharge status: Home / Self Care | Payer: Non-veteran care | Attending: Emergency Medicine | Admitting: Emergency Medicine

## 2023-03-25 DIAGNOSIS — Z7901 Long term (current) use of anticoagulants: Secondary | ICD-10-CM | POA: Diagnosis not present

## 2023-03-25 DIAGNOSIS — R040 Epistaxis: Secondary | ICD-10-CM | POA: Diagnosis not present

## 2023-03-25 DIAGNOSIS — I10 Essential (primary) hypertension: Secondary | ICD-10-CM | POA: Insufficient documentation

## 2023-03-25 DIAGNOSIS — Z743 Need for continuous supervision: Secondary | ICD-10-CM | POA: Diagnosis not present

## 2023-03-25 DIAGNOSIS — R58 Hemorrhage, not elsewhere classified: Secondary | ICD-10-CM | POA: Diagnosis not present

## 2023-03-25 HISTORY — DX: Cerebral aneurysm, nonruptured: I67.1

## 2023-03-25 MED ORDER — OXYMETAZOLINE HCL 0.05 % NA SOLN
1.0000 | Freq: Once | NASAL | Status: AC
  Administered 2023-03-25: 1 via NASAL

## 2023-03-25 MED ORDER — TRANEXAMIC ACID FOR EPISTAXIS
500.0000 mg | Freq: Once | TOPICAL | Status: AC
  Administered 2023-03-25: 500 mg via TOPICAL
  Filled 2023-03-25: qty 10
  Filled 2023-03-25: qty 5

## 2023-03-25 MED ORDER — METOPROLOL TARTRATE 25 MG PO TABS
25.0000 mg | ORAL_TABLET | Freq: Once | ORAL | Status: AC
  Administered 2023-03-25: 25 mg via ORAL
  Filled 2023-03-25: qty 1

## 2023-03-25 MED ORDER — ISOSORBIDE DINITRATE 30 MG PO TABS
30.0000 mg | ORAL_TABLET | Freq: Once | ORAL | Status: AC
  Administered 2023-03-25: 30 mg via ORAL
  Filled 2023-03-25: qty 1

## 2023-03-25 NOTE — ED Provider Notes (Signed)
 [facility] Provider Note    Event Date/Time   First MD Initiated Contact with Patient 03/25/23 0202     (approximate)   History   Epistaxis (Epistaxis in left nare this am.  Patient was given Afrin in Left nare by EMS pta.)   HPI  Samantha [name] is a 89 y.o. female   Past medical history of atrial fibrillation on Eliquis who is here for a nosebleed.  Started around 930 this evening and did not stop with direct pressure.  Has had a nosebleed a couple of weeks ago that she saw her primary doctor for.  No other acute medical complaints.  Independent Historian contributed to assessment above:  EMS who provides information as above        Physical Exam   Triage Vital Signs: ED Triage Vitals  Encounter Vitals Group     BP      Systolic BP Percentile      Diastolic BP Percentile      Pulse      Resp      Temp      Temp src      SpO2      Weight      Height      Head Circumference      Peak Flow      Pain Score      Pain Loc      Pain Education      Exclude from Growth Chart     Most recent vital signs: Vitals:   03/25/23 0345 03/25/23 0400  BP: (!) 223/89 (!) 214/86  Pulse: 65 64  Resp:    Temp:    SpO2: 95% 96%    General: Awake, no distress.  CV:  Good peripheral perfusion.  Resp:  Normal effort.  Abd:  No distention.  Other:  Epistaxis on the left nares.  Hypertension.  No obvious source of bleeding.  Moving all extremities with full active range of motion.   ED Results / Procedures / Treatments   Labs (all labs ordered are listed, but only abnormal results are displayed) Labs Reviewed - No data to display  PROCEDURES:  Critical Care performed: No  Epistaxis Management  Date/Time: 03/25/2023 4:44 AM  Performed by: Pilar Jarvis, MD Authorized by: Pilar Jarvis, MD   Consent:    Consent obtained:  Verbal   Consent given by:  Patient   Risks, benefits, and alternatives were discussed: yes     Risks discussed:   Infection and nasal injury   Alternatives discussed:  No treatment and delayed treatment Universal protocol:    Procedure explained and questions answered to patient or proxy's satisfaction: yes   Anesthesia:    Anesthesia method:  None Procedure details:    Treatment site:  L anterior   Repair method: Afrin and clamp.   Treatment complexity:  Limited Post-procedure details:    Assessment:  Bleeding stopped   Procedure completion:  Tolerated   MEDICATIONS ORDERED IN ED: Medications  metoprolol tartrate (LOPRESSOR) tablet 25 mg (has no administration in time range)  isosorbide dinitrate (ISORDIL) tablet 30 mg (has no administration in time range)  oxymetazoline (AFRIN) 0.05 % nasal spray 1 spray (1 spray Each Nare Given 03/25/23 0211)  tranexamic acid (CYKLOKAPRON) 1000 MG/10ML topical solution 500 mg (500 mg Topical Given 03/25/23 0215)    IMPRESSION / MDM / ASSESSMENT AND PLAN / ED COURSE  I reviewed the triage vital signs and the nursing notes.  Patient's presentation is most consistent with acute complicated illness / injury requiring diagnostic workup.  Differential diagnosis includes, but is not limited to, epistaxis, anterior versus posterior bleeding, acute blood loss anemia    MDM:    Nosebleed started without obvious inciting event tonight with no other acute medical complaints will attempt to achieve hemostasis.  Doubt acute blood loss anemia given the acuity of bleed to starting tonight and hemodynamic stability.  In terms of her high blood pressure, I will give her home medications now.  I reviewed a family medicine note from January and it seems that she has been discontinued on several of her antihypertensives recently with a plan to reevaluate blood pressure and restart as needed.  She may need to restart these, I informed her to follow-up with her primary doctor regarding high blood pressure checks and restart medications as  needed.     Hemostasis achieved with Afrin and clamp.  Observed for 1 hour with no rebleeding.  Blood pressure better with medications.  Discharge. FINAL CLINICAL IMPRESSION(S) / ED DIAGNOSES   Final diagnoses:  Epistaxis     Rx / DC Orders   ED Discharge Orders     None        Note:  This document was prepared using Dragon voice recognition software and may include unintentional dictation errors.    Pilar Jarvis, MD 03/25/23 [phone]

## 2023-03-25 NOTE — Discharge Instructions (Signed)
 If you have another nosebleed, blow your nose to get rid of clots and then spray 2 sprays of the Afrin into the affected nostril, and then apply a clamp for 30 minutes.  If this does not stop the bleeding come back to the emergency department.  Your blood pressure is high.  Your family doctor had noted that you stopped taking some blood pressure medications recently.  Please follow-up with your family medicine doctor to restart these medications if your blood pressure remains high.  Thank you for choosing [place] for your health care today!  Please see your primary doctor this week for a follow up appointment.   If you have any new, worsening, or unexpected symptoms call your doctor right away or come back to the emergency department for reevaluation.  It was my pleasure to care for you today.   Daneil Dan Modesto Charon, MD

## 2023-03-25 NOTE — ED Triage Notes (Signed)
 Patient was watching TV when her Left nare began bleeding, onset 10:00pm.  Patient denies fall or injury.  Patient is hypertensive.

## 2023-03-28 ENCOUNTER — Telehealth: Payer: Self-pay

## 2023-03-28 DIAGNOSIS — R519 Headache, unspecified: Secondary | ICD-10-CM | POA: Diagnosis not present

## 2023-03-28 DIAGNOSIS — Z59819 Housing instability, housed unspecified: Secondary | ICD-10-CM | POA: Diagnosis not present

## 2023-03-28 DIAGNOSIS — I4891 Unspecified atrial fibrillation: Secondary | ICD-10-CM | POA: Diagnosis not present

## 2023-03-28 DIAGNOSIS — I10 Essential (primary) hypertension: Secondary | ICD-10-CM | POA: Diagnosis not present

## 2023-03-28 DIAGNOSIS — Z5986 Financial insecurity: Secondary | ICD-10-CM | POA: Diagnosis not present

## 2023-03-28 NOTE — Transitions of Care (Post Inpatient/ED Visit) (Signed)
 03/28/2023  Name: Samantha [name] MRN: [id number] DOB: August 10, 1928  Today's TOC FU Call Status: Today's TOC FU Call Status:: Successful TOC FU Call Completed TOC FU Call Complete Date: 03/28/23 Patient's Name and Date of Birth confirmed.  Transition Care Management Follow-up Telephone Call Date of Discharge: 03/27/23 Discharge Facility: [facility] Type of Discharge: Emergency Department Reason for ED Visit: Other: (hypertension) How have you been since you were released from the hospital?: Better Any questions or concerns?: No  Items Reviewed: Did you receive and understand the discharge instructions provided?: Yes Medications obtained,verified, and reconciled?: Yes (Medications Reviewed) Any new allergies since your discharge?: No Dietary orders reviewed?: Yes Do you have support at home?: Yes People in Home: child(ren), adult  Medications Reviewed Today: Medications Reviewed Today     Reviewed by Karena Addison, LPN (Licensed Practical Nurse) on 03/28/23 at 1523  Med List Status: <None>   Medication Order Taking? Sig Documenting Provider Last Dose Status Informant  albuterol (VENTOLIN HFA) 108 (90 Base) MCG/ACT inhaler [id number] No Inhale 2 puffs into the lungs every 6 (six) hours as needed for wheezing or shortness of breath. Arnetha Courser, MD Taking Active   apixaban (ELIQUIS) 2.5 MG TABS tablet [id number] No Take 1 tablet (2.5 mg total) by mouth 2 (two) times daily. Margarita Mail, DO Taking Active Self, Pharmacy Records  ascorbic acid (VITAMIN C) 500 MG tablet [id number] No Take 500 mg by mouth daily. [provider] Taking Active   atorvastatin (LIPITOR) 80 MG tablet [id number]  Take 1 tablet (80 mg total) by mouth at bedtime. Jackolyn Confer, MD  Active   Cholecalciferol 25 MCG (1000 UT) tablet [id number] No Take 1,000 Units by mouth daily. [provider] Taking Active Self, Pharmacy Records           Med Note Mady Gemma,  Rosanne Sack D   Tue Oct 22, 2019  9:31 AM)    FLUoxetine (PROZAC) 40 MG capsule [id number] No Take 1 capsule (40 mg total) by mouth daily. Margarita Mail, DO Taking Active Self, Pharmacy Records  fluticasone [facility]) 50 MCG/ACT nasal spray [id number]  Place 2 sprays into both nostrils daily. Jackolyn Confer, MD  Active   isosorbide mononitrate (IMDUR) 60 MG 24 hr tablet [id number] No Take 1 tablet (60 mg total) by mouth daily. Margarita Mail, DO Taking Active Self, Pharmacy Records  lidocaine (LIDODERM) 5 % [id number] No Place 1 patch onto the skin every 12 (twelve) hours. Remove & Discard patch within 12 hours or as directed by MD Willy Eddy, MD Taking Active   metoprolol succinate (TOPROL-XL) 25 MG 24 hr tablet [id number] No Take 1 tablet (25 mg total) by mouth daily. Margarita Mail, DO Taking Active Self, Pharmacy Records  Multiple Vitamin (MULTIVITAMIN) tablet [id number] No Take 1 tablet by mouth daily. [provider] Taking Active Self, Pharmacy Records            Home Care and Equipment/Supplies: Were Home Health Services Ordered?: NA Any new equipment or medical supplies ordered?: NA  Functional Questionnaire: Do you need assistance with bathing/showering or dressing?: No Do you need assistance with meal preparation?: No Do you need assistance with eating?: No Do you have difficulty maintaining continence: No Do you need assistance with getting out of bed/getting out of a chair/moving?: No Do you have difficulty managing or taking your medications?: No  Follow up appointments reviewed: PCP Follow-up appointment confirmed?: No (declined, will call later to schedule) MD Provider Line Number:[phone] Given: No Specialist  Hospital Follow-up appointment confirmed?: Yes Date of Specialist follow-up appointment?: 03/28/23 Follow-Up Specialty Provider:: neuro Do you need transportation to your follow-up appointment?: No Do you understand care options if your  condition(s) worsen?: Yes-patient verbalized understanding    SIGNATURE Karena Addison, LPN [facility] Nurse Health Advisor Direct Dial [phone]

## 2023-03-29 ENCOUNTER — Encounter: Payer: Self-pay | Admitting: Pediatrics

## 2023-03-29 ENCOUNTER — Ambulatory Visit: Payer: No Typology Code available for payment source | Admitting: Pediatrics

## 2023-03-29 VITALS — BP 179/80 | HR 65 | Temp 98.8°F | Resp 16 | Ht 60.0 in | Wt 142.4 lb

## 2023-03-29 DIAGNOSIS — Z1330 Encounter for screening examination for mental health and behavioral disorders, unspecified: Secondary | ICD-10-CM

## 2023-03-29 DIAGNOSIS — Z09 Encounter for follow-up examination after completed treatment for conditions other than malignant neoplasm: Secondary | ICD-10-CM | POA: Diagnosis not present

## 2023-03-29 DIAGNOSIS — Z79899 Other long term (current) drug therapy: Secondary | ICD-10-CM | POA: Diagnosis not present

## 2023-03-29 DIAGNOSIS — R040 Epistaxis: Secondary | ICD-10-CM | POA: Diagnosis not present

## 2023-03-29 DIAGNOSIS — H9193 Unspecified hearing loss, bilateral: Secondary | ICD-10-CM

## 2023-03-29 DIAGNOSIS — I10 Essential (primary) hypertension: Secondary | ICD-10-CM | POA: Diagnosis not present

## 2023-03-29 DIAGNOSIS — I480 Paroxysmal atrial fibrillation: Secondary | ICD-10-CM | POA: Diagnosis not present

## 2023-03-29 NOTE — Progress Notes (Signed)
 Office Visit  BP (!) 179/80 (BP Location: Left Arm, Patient Position: Sitting, Cuff Size: Normal)   Pulse 65   Temp 98.8 F (37.1 C) (Oral)   Resp 16   Ht 5' (1.524 m)   Wt 142 lb 6.4 oz (64.6 kg)   SpO2 98%   BMI 27.81 kg/m    Subjective:    Patient ID: Samantha [name], female    DOB: 1928/05/18, 89 y.o.   MRN: [id number]  HPI: Samantha [name] is a 89 y.o. female  Chief Complaint  Patient presents with   Hospitalization Follow-up    Seen for nose bleed. Still feels weak and uneasy    Discussed the use of AI scribe software for clinical note transcription with the patient, who gave verbal consent to proceed.  History of Present Illness   The patient, with hypertension and a history of brain artery occlusion, presents with medication management issues and persistent symptoms. She is accompanied by her daughter, who assists with managing her medications and appointments.  She is experiencing significant confusion regarding her medication regimen, which has been complicated by recent changes from her cardiologist and neurologist. Her cardiologist adjusted her medications, including amiodarone and metoprolol, prior to her hospital visit for epistaxis. She has not yet picked up the new prescription due to the need to confirm with her primary care provider. She has been instructed to cut her metoprolol pills in half and has been taking amiodarone as prescribed. She is no longer taking diltiazem. A new prescription for lisinopril 10 mg was called in by her neurologist, but she has not yet picked it up.  She reports persistent headaches and a recent episode of epistaxis. She experiences fatigue and a lack of energy, stating she feels exhausted and has no energy to do anything but sleep. She also reports dizziness and unsteadiness, which she describes as feeling 'not coordinated'.  Her daughter assists her with managing her medications and appointments. She prefers to live independently  and is resistant to moving in with her sister, despite having support from her neighbors and sister living nearby.     Relevant past medical, surgical, family and social history reviewed and updated as indicated. Interim medical history since our last visit reviewed. Allergies and medications reviewed and updated.  ROS per HPI unless specifically indicated above     Objective:    BP (!) 179/80 (BP Location: Left Arm, Patient Position: Sitting, Cuff Size: Normal)   Pulse 65   Temp 98.8 F (37.1 C) (Oral)   Resp 16   Ht 5' (1.524 m)   Wt 142 lb 6.4 oz (64.6 kg)   SpO2 98%   BMI 27.81 kg/m   Wt Readings from Last 3 Encounters:  03/29/23 142 lb 6.4 oz (64.6 kg)  03/25/23 139 lb 15.9 oz (63.5 kg)  02/24/23 143 lb (64.9 kg)     Physical Exam Constitutional:      Appearance: Normal appearance.  HENT:     Head: Normocephalic and atraumatic.  Eyes:     Pupils: Pupils are equal, round, and reactive to light.  Cardiovascular:     Rate and Rhythm: Normal rate and regular rhythm.     Pulses: Normal pulses.  Pulmonary:     Effort: Pulmonary effort is normal.     Breath sounds: Normal breath sounds.  Musculoskeletal:        General: Normal range of motion.     Cervical back: Normal range of motion.  Skin:  General: Skin is warm and dry.     Capillary Refill: Capillary refill takes less than 2 seconds.  Neurological:     General: No focal deficit present.     Mental Status: She is alert. Mental status is at baseline.  Psychiatric:        Mood and Affect: Mood normal.        Behavior: Behavior normal.         03/29/2023   10:53 AM 02/24/2023   12:59 PM 02/08/2023    2:03 PM 02/03/2023    2:12 PM 10/06/2022   11:57 AM  Depression screen PHQ 2/9  Decreased Interest 0 3 2 1  0  Down, Depressed, Hopeless 1 3 2 1  0  PHQ - 2 Score 1 6 4 2  0  Altered sleeping 3 3 2 1    Tired, decreased energy 3 3 2 1    Change in appetite 3 3 2 1    Feeling bad or failure about yourself  2 3 1  0    Trouble concentrating 2 3 2 1    Moving slowly or fidgety/restless 3 3 2 1    Suicidal thoughts 1 1 1 1    PHQ-9 Score 18 25 16 8    Difficult doing work/chores Extremely dIfficult Very difficult Somewhat difficult Somewhat difficult        03/29/2023   10:53 AM 02/24/2023    1:00 PM 02/08/2023    2:03 PM 02/03/2023    2:12 PM  GAD 7 : Generalized Anxiety Score  Nervous, Anxious, on Edge 3 3 1 1   Control/stop worrying 3 3 1 1   Worry too much - different things 3 3 1 1   Trouble relaxing 3 3 1 1   Restless 0 0 1 1  Easily annoyed or irritable 2 0 1 0  Afraid - awful might happen 0 1 1 1   Total GAD 7 Score 14 13 7 6   Anxiety Difficulty Extremely difficult Very difficult Somewhat difficult Not difficult at all       Assessment & Plan:  Assessment & Plan   Hospital discharge follow-up Polypharmacy Essential hypertension Assessment & Plan: Pt with multiple recent medication changes. Had BP well controlled previously. She was seen by neurology and recommended to start lisinopril not yet picked up. I suspect poorly controlled readings may be due to lowered metoprolol dose. I reviewed home readings and she is usually in 90s/50s in am and then 140s/90s in the afternoon. Encouraged pt to get new bp cuff and bring to next visit. For now, will go back to metop 25mg  given no improvement on fatigue/lethargy on lower dose. Hold off on lisinopril. Earlier this year, we discontinued imdur which may be another agent to tap into. Has f/u with cardiology in 2 weeks.    Paroxysmal atrial fibrillation (HCC) Recent changes in medication regimen by cardiology, including reduction of Metoprolol dose, which may be contributing to increased blood pressure and symptoms of fatigue and dizziness. -Increase Metoprolol back to 25mg  daily. -Do not pick up new prescription of Lisinopril 10mg  daily. -Next cardiology follow-up scheduled for March 17th.  Epistaxis Suspect due to blood thinner. No recurrence. Consider  discontinuation after risk benefit discussion.  Encounter for behavioral health screening As part of their intake evaluation, the patient was screened for depression, anxiety.  PHQ9 SCORE 18, GAD7 SCORE 14. Screening results positive for tested conditions. Declines med changes, revisit at follow up.   Headaches: Ongoing symptoms, with recent hospital visit for nosebleed. -Continue current treatment plan. -Neurologist has recommended  an MRI without contrast, pending scheduling.  Follow up plan: Scheduled  Jackolyn Confer, MD

## 2023-03-29 NOTE — Assessment & Plan Note (Signed)
 Pt with multiple recent medication changes. Had BP well controlled previously. She was seen by neurology and recommended to start lisinopril not yet picked up. I suspect poorly controlled readings may be due to lowered metoprolol dose. I reviewed home readings and she is usually in 90s/50s in am and then 140s/90s in the afternoon. Encouraged pt to get new bp cuff and bring to next visit. For now, will go back to metop 25mg  given no improvement on fatigue/lethargy on lower dose. Hold off on lisinopril. Earlier this year, we discontinued imdur which may be another agent to tap into. Has f/u with cardiology in 2 weeks.

## 2023-03-29 NOTE — Patient Instructions (Signed)
 Please start taking metoprolol 25mg  (FULL PILL) Take amiodarone just once daily  Do not start or pick up LISINOPRIL 10mg   I will work on the getting the MRI scheduled and communicating with the brain and heart doctors.

## 2023-03-30 ENCOUNTER — Other Ambulatory Visit: Payer: Self-pay | Admitting: Neurology

## 2023-03-30 DIAGNOSIS — R519 Headache, unspecified: Secondary | ICD-10-CM

## 2023-04-03 ENCOUNTER — Encounter: Payer: Self-pay | Admitting: Pediatrics

## 2023-04-06 ENCOUNTER — Ambulatory Visit
Admission: RE | Admit: 2023-04-06 | Discharge: 2023-04-06 | Disposition: A | Discharge status: Home / Self Care | Source: Ambulatory Visit | Attending: Neurology | Admitting: Neurology

## 2023-04-06 DIAGNOSIS — I6782 Cerebral ischemia: Secondary | ICD-10-CM | POA: Diagnosis not present

## 2023-04-06 DIAGNOSIS — R519 Headache, unspecified: Secondary | ICD-10-CM | POA: Diagnosis not present

## 2023-04-11 ENCOUNTER — Telehealth: Payer: Self-pay | Admitting: Pediatrics

## 2023-04-11 NOTE — Telephone Encounter (Signed)
 Copied from CRM [id number]. Topic: Referral - Status >> Mar 31, 2023 11:58 AM Antony Haste wrote: Reason for CRM: The patient wanted to check the status on her Neurology referral for an MRI, advised the current status of her referral shows it hasn't been authorized. Informed the patient once this has been authorized the practice will reach out to her for scheduling.

## 2023-04-13 NOTE — Telephone Encounter (Signed)
 This CRM was from 03/31/23. Patient had the MRI 04/06/23. Nothing further is needed.

## 2023-04-17 DIAGNOSIS — R040 Epistaxis: Secondary | ICD-10-CM | POA: Diagnosis not present

## 2023-05-09 ENCOUNTER — Ambulatory Visit: Payer: Medicare HMO | Admitting: Internal Medicine

## 2023-05-16 DIAGNOSIS — H903 Sensorineural hearing loss, bilateral: Secondary | ICD-10-CM | POA: Insufficient documentation

## 2023-05-16 HISTORY — DX: Sensorineural hearing loss, bilateral: H90.3

## 2023-05-23 DIAGNOSIS — R0602 Shortness of breath: Secondary | ICD-10-CM | POA: Diagnosis not present

## 2023-05-23 DIAGNOSIS — E7800 Pure hypercholesterolemia, unspecified: Secondary | ICD-10-CM | POA: Diagnosis not present

## 2023-05-23 DIAGNOSIS — Z7901 Long term (current) use of anticoagulants: Secondary | ICD-10-CM | POA: Diagnosis not present

## 2023-05-23 DIAGNOSIS — R5383 Other fatigue: Secondary | ICD-10-CM | POA: Diagnosis not present

## 2023-05-23 DIAGNOSIS — I10 Essential (primary) hypertension: Secondary | ICD-10-CM | POA: Diagnosis not present

## 2023-05-23 DIAGNOSIS — I480 Paroxysmal atrial fibrillation: Secondary | ICD-10-CM | POA: Diagnosis not present

## 2023-05-23 DIAGNOSIS — R42 Dizziness and giddiness: Secondary | ICD-10-CM | POA: Diagnosis not present

## 2023-05-23 DIAGNOSIS — R4189 Other symptoms and signs involving cognitive functions and awareness: Secondary | ICD-10-CM | POA: Diagnosis not present

## 2023-05-23 DIAGNOSIS — I2510 Atherosclerotic heart disease of native coronary artery without angina pectoris: Secondary | ICD-10-CM | POA: Diagnosis not present

## 2023-05-25 ENCOUNTER — Ambulatory Visit: Payer: Medicare HMO | Admitting: Nurse Practitioner

## 2023-05-28 ENCOUNTER — Other Ambulatory Visit: Payer: Self-pay | Admitting: Pediatrics

## 2023-05-28 DIAGNOSIS — E785 Hyperlipidemia, unspecified: Secondary | ICD-10-CM

## 2023-05-30 ENCOUNTER — Encounter: Payer: Self-pay | Admitting: Pediatrics

## 2023-05-30 ENCOUNTER — Ambulatory Visit: Payer: Self-pay | Admitting: Pediatrics

## 2023-05-30 VITALS — BP 164/85 | HR 80 | Temp 97.1°F | Wt 141.6 lb

## 2023-05-30 DIAGNOSIS — R519 Headache, unspecified: Secondary | ICD-10-CM | POA: Diagnosis not present

## 2023-05-30 DIAGNOSIS — I10 Essential (primary) hypertension: Secondary | ICD-10-CM | POA: Diagnosis not present

## 2023-05-30 DIAGNOSIS — M25561 Pain in right knee: Secondary | ICD-10-CM

## 2023-05-30 NOTE — Progress Notes (Signed)
 Office Visit  BP (!) 164/85   Pulse 80   Temp (!) 97.1 F (36.2 C) (Oral)   Wt 141 lb 9.6 oz (64.2 kg)   SpO2 96%   BMI 27.65 kg/m    Subjective:    Patient ID: Samantha [name], female    DOB: Apr 23, 1928, 89 y.o.   MRN: [id number]  HPI: Samantha [name] is a 89 y.o. female  Chief Complaint  Patient presents with   Hypertension    Discussed the use of AI scribe software for clinical note transcription with the patient, who gave verbal consent to proceed.  History of Present Illness   Samantha [name] is a 89 year old female who presents with acute right knee pain and swelling.  She has been experiencing severe pain and swelling in her right knee for approximately one week, which began after gardening activities such as planting flowers and sweeping her patio. There have been no falls or direct trauma to the knee. The pain is exacerbated by standing, putting pressure on her foot, and knee movement. She has been using a knee brace, which provides some relief, although it feels tight. She takes extra strength Tylenol  for pain management but avoids ibuprofen due to her use of Eliquis . There has been no improvement in symptoms over the past week.  She has a history of an aneurysm diagnosis, which was later contradicted by a recent MRI. She is concerned due to a family history of aneurysms, as her daughter and niece both died from them. She experiences headaches and arm pain, described as shooting pain while sitting.  Her blood pressure was elevated during this visit, which she attributes to pain. She monitors her blood pressure at home and notes fluctuations, with some readings being too low. She has been experiencing sleep disturbances, sleeping more during the day and waking frequently at night to use the bathroom.  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 [facility]  and additional equipment to assist with her hearing loss.      Relevant past medical, surgical, family and social history reviewed and updated as indicated. Interim medical history since our last visit reviewed. Allergies and medications reviewed and updated.  ROS per HPI unless specifically indicated above     Objective:    BP (!) 164/85   Pulse 80   Temp (!) 97.1 F (36.2 C) (Oral)   Wt 141 lb 9.6 oz (64.2 kg)   SpO2 96%   BMI 27.65 kg/m   Wt Readings from Last 3 Encounters:  05/30/23 141 lb 9.6 oz (64.2 kg)  03/29/23 142 lb 6.4 oz (64.6 kg)  03/25/23 139 lb 15.9 oz (63.5 kg)     Physical Exam Constitutional:      Appearance: Normal appearance.  HENT:     Head: Normocephalic and atraumatic.  Eyes:     Pupils: Pupils are equal, round, and reactive to light.  Cardiovascular:     Rate and Rhythm: Normal rate and regular rhythm.     Pulses: Normal pulses.     Heart sounds: Normal heart sounds.  Pulmonary:     Effort: Pulmonary effort is normal.     Breath sounds: Normal breath sounds.  Musculoskeletal:        General: Swelling present. Normal range of motion.     Cervical back: Normal range of motion.  Comments: R knee joint swelling, no warmth   Skin:    General: Skin is warm and dry.     Capillary Refill: Capillary refill takes less than 2 seconds.  Neurological:     General: No focal deficit present.     Mental Status: She is alert. Mental status is at baseline.  Psychiatric:        Mood and Affect: Mood normal.        Behavior: Behavior normal.         05/30/2023    1:15 PM 03/29/2023   10:53 AM 02/24/2023   12:59 PM 02/08/2023    2:03 PM 02/03/2023    2:12 PM  Depression screen PHQ 2/9  Decreased Interest 2 0 3 2 1   Down, Depressed, Hopeless 1 1 3 2 1   PHQ - 2 Score 3 1 6 4 2   Altered sleeping 3 3 3 2 1   Tired, decreased energy 3 3 3 2 1   Change in appetite 1 3 3 2 1   Feeling bad or failure about yourself  1 2 3 1  0  Trouble concentrating 1 2 3 2 1    Moving slowly or fidgety/restless  3 3 2 1   Suicidal thoughts 1 1 1 1 1   PHQ-9 Score 13 18 25 16 8   Difficult doing work/chores Not difficult at all Extremely dIfficult Very difficult Somewhat difficult Somewhat difficult       05/30/2023    1:16 PM 03/29/2023   10:53 AM 02/24/2023    1:00 PM 02/08/2023    2:03 PM  GAD 7 : Generalized Anxiety Score  Nervous, Anxious, on Edge 1 3 3 1   Control/stop worrying 1 3 3 1   Worry too much - different things 1 3 3 1   Trouble relaxing 1 3 3 1   Restless 1 0 0 1  Easily annoyed or irritable 1 2 0 1  Afraid - awful might happen 1 0 1 1  Total GAD 7 Score 7 14 13 7   Anxiety Difficulty Somewhat difficult Extremely difficult Very difficult Somewhat difficult       Assessment & Plan:  Assessment & Plan   Acute pain of right knee Acute knee pain with swelling post-gardening, likely due to tendon irritation or inflammatory process. No trauma, dislocation, or fracture. Conservative management chosen. - Continue knee brace use, especially at night. - Use acetaminophen  for pain management. - Apply diclofenac gel to affected area. - Ice knee to reduce swelling. - Elevate knee when possible. - Reassess in three weeks; consider x-ray if symptoms persist.  Essential hypertension Assessment & Plan: Elevated blood pressure likely secondary to knee pain. Current antihypertensive regimen may be suboptimal. Intermittent pulsating headaches possibly related to elevated blood pressure. Small aneurysm not confirmed on recent MRI. Family history of aneurysms noted. - Do not adjust antihypertensive medications currently due reduce confusion around medication changes/pill burden. - Monitor blood pressure at home. - Contact cardiology nurse practitioner to discuss recent medication changes and potential amlodipine restart. - Reassess blood pressure management post pain control.   Frequent headaches Unclear etiology. May be multifactorial and related to above.   - Has upcoming neuro eval, will wait and reassess if still wants second opinion - Monitor headache frequency and severity. - Reassess headache management in conjunction with blood pressure control.     Follow up plan: Return in about 2 weeks (around 06/13/2023) for knee pain and chronic.  Hadassah Letters, MD

## 2023-05-30 NOTE — Patient Instructions (Signed)
 I will message neurology  I will message cardiology  For your knee : - use tylenol  and voltaren gel

## 2023-05-30 NOTE — Telephone Encounter (Signed)
 Requested medication (s) are due for refill today: Yes  Requested medication (s) are on the active medication list: Yes  Last refill:  02/24/23  Future visit scheduled: Yes today  Notes to clinic:  Unable to refill per protocol due to failed labs, no updated results.      Requested Prescriptions  Pending Prescriptions Disp Refills   atorvastatin  (LIPITOR) 80 MG tablet [Pharmacy Med Name: ATORVASTATIN  80 MG TABLET] 90 tablet 0    Sig: Take 1 tablet (80 mg total) by mouth at bedtime.     Cardiovascular:  Antilipid - Statins Failed - 05/30/2023  1:09 PM      Failed - Lipid Panel in normal range within the last 12 months    Cholesterol  Date Value Ref Range Status  06/23/2020 179 <200 mg/dL Final   LDL Cholesterol (Calc)  Date Value Ref Range Status  06/23/2020 94 mg/dL (calc) Final    Comment:    Reference range: <100 . Desirable range <100 mg/dL for primary prevention;   <70 mg/dL for patients with CHD or diabetic patients  with > or = 2 CHD risk factors. Aaron Aas LDL-C is now calculated using the Martin-Hopkins  calculation, which is a validated novel method providing  better accuracy than the Friedewald equation in the  estimation of LDL-C.  Melinda Sprawls et al. Erroll Heard. 1610;960(45): 2061-2068  (http://education.[facility].com/faq/FAQ164)    HDL  Date Value Ref Range Status  06/23/2020 35 (L) > OR = 50 mg/dL Final   Triglycerides  Date Value Ref Range Status  06/23/2020 377 (H) <150 mg/dL Final    Comment:    . If a non-fasting specimen was collected, consider repeat triglyceride testing on a fasting specimen if clinically indicated.  Imagene Mam et al. J. of Clin. Lipidol. 2015;9:129-169. Aaron Aas          Passed - Patient is not pregnant      Passed - Valid encounter within last 12 months    Recent Outpatient Visits           Today    [facility] Hadassah Letters, MD   2 months ago Essential hypertension    [facility] Hadassah Letters, MD

## 2023-06-01 ENCOUNTER — Encounter: Payer: Self-pay | Admitting: Pediatrics

## 2023-06-01 NOTE — Assessment & Plan Note (Signed)
 Elevated blood pressure likely secondary to knee pain. Current antihypertensive regimen may be suboptimal. Intermittent pulsating headaches possibly related to elevated blood pressure. Small aneurysm not confirmed on recent MRI. Family history of aneurysms noted. - Do not adjust antihypertensive medications currently due reduce confusion around medication changes/pill burden. - Monitor blood pressure at home. - Contact cardiology nurse practitioner to discuss recent medication changes and potential amlodipine restart. - Reassess blood pressure management post pain control.

## 2023-06-13 ENCOUNTER — Ambulatory Visit (INDEPENDENT_AMBULATORY_CARE_PROVIDER_SITE_OTHER): Admitting: Pediatrics

## 2023-06-13 ENCOUNTER — Encounter: Payer: Self-pay | Admitting: Pediatrics

## 2023-06-13 VITALS — BP 179/90 | HR 64 | Temp 97.6°F | Wt 141.0 lb

## 2023-06-13 DIAGNOSIS — E785 Hyperlipidemia, unspecified: Secondary | ICD-10-CM | POA: Diagnosis not present

## 2023-06-13 DIAGNOSIS — I480 Paroxysmal atrial fibrillation: Secondary | ICD-10-CM | POA: Diagnosis not present

## 2023-06-13 DIAGNOSIS — I10 Essential (primary) hypertension: Secondary | ICD-10-CM

## 2023-06-13 MED ORDER — DILTIAZEM HCL ER COATED BEADS 120 MG PO CP24: 120.0000 mg | ORAL_CAPSULE | Freq: Every day | ORAL | 3 refills | Status: DC

## 2023-06-13 MED ORDER — ATORVASTATIN CALCIUM 80 MG PO TABS: 80.0000 mg | ORAL_TABLET | Freq: Every day | ORAL | 3 refills | Status: AC

## 2023-06-13 NOTE — Progress Notes (Signed)
 Office Visit  BP (!) 179/90   Pulse 64   Temp 97.6 F (36.4 C) (Oral)   Wt 141 lb (64 kg)   SpO2 98%   BMI 27.54 kg/m    Subjective:    Patient ID: Samantha [name], female    DOB: December 16, 1928, 89 y.o.   MRN: [id number]  HPI: Samantha [name] is a 89 y.o. female  Chief Complaint  Patient presents with   Knee Pain    Pt states knee is getting better but it is still sometimes painful    Discussed the use of AI scribe software for clinical note transcription with the patient, who gave verbal consent to proceed.  History of Present Illness   Samantha [name] is a 89 year old female with hypertension who presents with concerns about memory loss and high blood pressure.  She is concerned about her memory, noting an incident where she forgot about being on her knees while vacuuming. She worries about potential dementia, as she feels her memory is worsening.  She experiences ongoing knee pain, which has improved but still causes discomfort with certain movements. The pain began after using a hand vacuum on her hands and knees. Swelling has decreased, and she is currently taking meloxicam, which she believes has helped with the swelling.  She monitors her blood pressure daily and reports a recent reading of 195/90, attributing it to feeling unwell and being upset about her son not calling on Mother's Day. She mentions that her blood pressure has not been consistently high and recalls changes in her medication regimen by her cardiologist, which may have affected her blood pressure control.  She is currently taking several medications, including atorvastatin  and meloxicam, and manages her medications herself, receiving some from the [place] and others from a drugstore. She expresses confusion about her medication list due to discrepancies between different printouts.      Relevant past medical, surgical, family and social history reviewed and updated as indicated. Interim medical history since  our last visit reviewed. Allergies and medications reviewed and updated.  ROS per HPI unless specifically indicated above     Objective:     BP (!) 179/90   Pulse 64   Temp 97.6 F (36.4 C) (Oral)   Wt 141 lb (64 kg)   SpO2 98%   BMI 27.54 kg/m   Wt Readings from Last 3 Encounters:  06/13/23 141 lb (64 kg)  05/30/23 141 lb 9.6 oz (64.2 kg)  03/29/23 142 lb 6.4 oz (64.6 kg)     Physical Exam Constitutional:      Appearance: Normal appearance.  HENT:     Head: Normocephalic and atraumatic.  Eyes:     Pupils: Pupils are equal, round, and reactive to light.  Cardiovascular:     Rate and Rhythm: Normal rate and regular rhythm.     Pulses: Normal pulses.     Heart sounds: Normal heart sounds.  Pulmonary:     Effort: Pulmonary effort is normal.     Breath sounds: Normal breath sounds.  Musculoskeletal:        General: Normal range of motion.     Cervical back: Normal range of motion.  Skin:    General: Skin is warm and dry.     Capillary Refill: Capillary refill takes less than 2 seconds.  Neurological:     General: No focal deficit present.     Mental Status: She is alert. Mental status is at baseline.  Psychiatric:  Mood and Affect: Mood normal.        Behavior: Behavior normal.         06/13/2023    2:41 PM 05/30/2023    1:15 PM 03/29/2023   10:53 AM 02/24/2023   12:59 PM 02/08/2023    2:03 PM  Depression screen PHQ 2/9  Decreased Interest 0 2 0 3 2  Down, Depressed, Hopeless 0 1 1 3 2   PHQ - 2 Score 0 3 1 6 4   Altered sleeping 0 3 3 3 2   Tired, decreased energy 0 3 3 3 2   Change in appetite 0 1 3 3 2   Feeling bad or failure about yourself  0 1 2 3 1   Trouble concentrating 0 1 2 3 2   Moving slowly or fidgety/restless 0  3 3 2   Suicidal thoughts 0 1 1 1 1   PHQ-9 Score 0 13 18 25 16   Difficult doing work/chores Not difficult at all Not difficult at all Extremely dIfficult Very difficult Somewhat difficult       06/13/2023    2:42 PM 05/30/2023     1:16 PM 03/29/2023   10:53 AM 02/24/2023    1:00 PM  GAD 7 : Generalized Anxiety Score  Nervous, Anxious, on Edge 0 1 3 3   Control/stop worrying 0 1 3 3   Worry too much - different things 0 1 3 3   Trouble relaxing 0 1 3 3   Restless 0 1 0 0  Easily annoyed or irritable 0 1 2 0  Afraid - awful might happen 0 1 0 1  Total GAD 7 Score 0 7 14 13   Anxiety Difficulty  Somewhat difficult Extremely difficult Very difficult       Assessment & Plan:  Assessment & Plan   Essential hypertension Assessment & Plan: Recent elevated blood pressure at 195/90 mmHg, possibly due to medication changes. Current regimen listed on her printed sheet different than most recent cardiology recommendations. Updated today and will reassess in 2 weeks. - Review and update medication list. - Add amlodipine if blood pressure remains elevated. - Contact in a couple of days for blood pressure readings. - Refill atorvastatin . - Ensure discontinuation of metoprolol .  Orders: -     dilTIAZem  HCl ER Coated Beads; Take 1 capsule (120 mg total) by mouth daily.  Dispense: 90 capsule; Refill: 3  Paroxysmal atrial fibrillation [facility]) Assessment & Plan: Managed with current medication regimen. She prefers independent medication management. - Provide accurate medication list. - Discontinue metoprolol .  Orders: -     dilTIAZem  HCl ER Coated Beads; Take 1 capsule (120 mg total) by mouth daily.  Dispense: 90 capsule; Refill: 3  Dyslipidemia Assessment & Plan: Requesting refills of below.  Orders: -     Atorvastatin  Calcium ; Take 1 tablet (80 mg total) by mouth at bedtime.  Dispense: 90 tablet; Refill: 3     Follow up plan: Return in about 2 weeks (around 06/27/2023) for Chronic illness f/u, HTN.  Hadassah Letters, MD

## 2023-06-13 NOTE — Patient Instructions (Signed)
 Plan: - if your blood pressure is above 150/90, please restart AMLODIPINE 2.5MG . If not elevated do not take this medication

## 2023-06-20 ENCOUNTER — Encounter: Payer: Self-pay | Admitting: Pediatrics

## 2023-06-20 DIAGNOSIS — R519 Headache, unspecified: Secondary | ICD-10-CM | POA: Diagnosis not present

## 2023-06-20 DIAGNOSIS — Z1331 Encounter for screening for depression: Secondary | ICD-10-CM | POA: Diagnosis not present

## 2023-06-20 NOTE — Assessment & Plan Note (Signed)
 Managed with current medication regimen. She prefers independent medication management. - Provide accurate medication list. - Discontinue metoprolol .

## 2023-06-20 NOTE — Assessment & Plan Note (Signed)
 Requesting refills of below.

## 2023-06-20 NOTE — Assessment & Plan Note (Addendum)
 Recent elevated blood pressure at 195/90 mmHg, possibly due to medication changes. Current regimen listed on her printed sheet different than most recent cardiology recommendations. Updated today and will reassess in 2 weeks. - Review and update medication list. - Add amlodipine if blood pressure remains elevated. - Contact in a couple of days for blood pressure readings. - Refill atorvastatin . - Ensure discontinuation of metoprolol .

## 2023-06-29 ENCOUNTER — Encounter: Payer: Self-pay | Admitting: Pediatrics

## 2023-06-29 ENCOUNTER — Ambulatory Visit (INDEPENDENT_AMBULATORY_CARE_PROVIDER_SITE_OTHER): Admitting: Pediatrics

## 2023-06-29 VITALS — BP 133/80 | HR 109 | Temp 98.2°F | Resp 16 | Wt 141.0 lb

## 2023-06-29 DIAGNOSIS — F419 Anxiety disorder, unspecified: Secondary | ICD-10-CM | POA: Diagnosis not present

## 2023-06-29 DIAGNOSIS — R350 Frequency of micturition: Secondary | ICD-10-CM

## 2023-06-29 DIAGNOSIS — R5383 Other fatigue: Secondary | ICD-10-CM | POA: Diagnosis not present

## 2023-06-29 DIAGNOSIS — I480 Paroxysmal atrial fibrillation: Secondary | ICD-10-CM | POA: Diagnosis not present

## 2023-06-29 DIAGNOSIS — M5481 Occipital neuralgia: Secondary | ICD-10-CM | POA: Diagnosis not present

## 2023-06-29 DIAGNOSIS — I10 Essential (primary) hypertension: Secondary | ICD-10-CM

## 2023-06-29 MED ORDER — AMLODIPINE BESYLATE 2.5 MG PO TABS: 2.5000 mg | ORAL_TABLET | Freq: Every day | ORAL | 3 refills | Status: DC

## 2023-06-29 NOTE — Progress Notes (Signed)
 Office Visit  BP 133/80 (BP Location: Left Arm, Patient Position: Sitting, Cuff Size: Normal)   Pulse (!) 109   Temp 98.2 F (36.8 C) (Oral)   Resp 16   Wt 141 lb (64 kg)   SpO2 96%   BMI 27.54 kg/m    Subjective:    Patient ID: Samantha [name], female    DOB: 07-01-1928, 89 y.o.   MRN: [id number]  HPI: Samantha [name] is a 89 y.o. female  Chief Complaint  Patient presents with   Medical Management of Chronic Issues    2 weeks follow-up HTN    Discussed the use of AI scribe software for clinical note transcription with the patient, who gave verbal consent to proceed.  History of Present Illness   Samantha [name] is a 89 year old female with hypertension and atrial fibrillation who presents with anxiety and fatigue.  She experiences anxiety and fatigue, accompanied by episodes of confusion and disorientation. These episodes have increased in frequency following a change in her medication regimen. Her blood pressure remains elevated at approximately 150/100 mmHg despite daily amlodipine  use. She continues to feel anxious and fatigued, with associated shaking and tachycardia.  She has a history of atrial fibrillation and reports increased fatigue and anxiety, feeling jittery and tired with episodes of shaking. She takes diltiazem  for atrial fibrillation but is uncertain about its effectiveness. She also notes recent nocturia, waking four to five times per night.  She has a history of an aneurysm diagnosed at [facility], which contributes to significant anxiety. She experiences sudden, pulsating, and debilitating headaches localized above her eye, which she attributes to the aneurysm.  A recent visit to the [facility] hospital for a new hearing aid resulted in significant fatigue and disorientation. She was unable to eat due to nerves and went to bed early, waking up confused about the time and her schedule. The visit was physically exhausting due to extensive walking, which she is  not accustomed to.     Relevant past medical, surgical, family and social history reviewed and updated as indicated. Interim medical history since our last visit reviewed. Allergies and medications reviewed and updated.  ROS per HPI unless specifically indicated above     Objective:     BP 133/80 (BP Location: Left Arm, Patient Position: Sitting, Cuff Size: Normal)   Pulse (!) 109   Temp 98.2 F (36.8 C) (Oral)   Resp 16   Wt 141 lb (64 kg)   SpO2 96%   BMI 27.54 kg/m   Wt Readings from Last 3 Encounters:  06/29/23 141 lb (64 kg)  06/13/23 141 lb (64 kg)  05/30/23 141 lb 9.6 oz (64.2 kg)     Physical Exam Constitutional:      Appearance: Normal appearance.  Cardiovascular:     Rate and Rhythm: Normal rate and regular rhythm.     Pulses: Normal pulses.     Heart sounds: Normal heart sounds.  Pulmonary:     Effort: Pulmonary effort is normal.     Breath sounds: Normal breath sounds.  Musculoskeletal:        General: Normal range of motion.  Skin:    Comments: Normal skin color  Neurological:     General: No focal deficit present.     Mental Status: She is alert. Mental status is at baseline.  Psychiatric:        Mood and Affect: Mood normal.        Behavior: Behavior normal.  Thought Content: Thought content normal.         06/13/2023    2:41 PM 05/30/2023    1:15 PM 03/29/2023   10:53 AM 02/24/2023   12:59 PM 02/08/2023    2:03 PM  Depression screen PHQ 2/9  Decreased Interest 0 2 0 3 2  Down, Depressed, Hopeless 0 1 1 3 2   PHQ - 2 Score 0 3 1 6 4   Altered sleeping 0 3 3 3 2   Tired, decreased energy 0 3 3 3 2   Change in appetite 0 1 3 3 2   Feeling bad or failure about yourself  0 1 2 3 1   Trouble concentrating 0 1 2 3 2   Moving slowly or fidgety/restless 0  3 3 2   Suicidal thoughts 0 1 1 1 1   PHQ-9 Score 0 13 18 25 16   Difficult doing work/chores Not difficult at all Not difficult at all Extremely dIfficult Very difficult Somewhat difficult        06/13/2023    2:42 PM 05/30/2023    1:16 PM 03/29/2023   10:53 AM 02/24/2023    1:00 PM  GAD 7 : Generalized Anxiety Score  Nervous, Anxious, on Edge 0 1 3 3   Control/stop worrying 0 1 3 3   Worry too much - different things 0 1 3 3   Trouble relaxing 0 1 3 3   Restless 0 1 0 0  Easily annoyed or irritable 0 1 2 0  Afraid - awful might happen 0 1 0 1  Total GAD 7 Score 0 7 14 13   Anxiety Difficulty  Somewhat difficult Extremely difficult Very difficult       Assessment & Plan:  Assessment & Plan   Other fatigue Suspect multifactorial, see discussions below. She has not had any recent blood work so will plan on getting work up with below as well and continue addressing comorbidities. -     CBC with Differential/Platelet -     Comprehensive metabolic panel with GFR -     Magnesium  -     TSH -     Iron, TIBC and Ferritin Panel -     Vitamin B12 -     Folate -     VITAMIN D  25 Hydroxy (Vit-D Deficiency, Fractures)  Essential hypertension Assessment & Plan: Blood pressure remains elevated but improved with amlodipine . Recommend restarting amlodipine . May need 5mg  but will reassess at next follow up. - Continue amlodipine  2.5 mg daily.  Orders: -     amLODIPine  Besylate; Take 1 tablet (2.5 mg total) by mouth daily.  Dispense: 90 tablet; Refill: 3  Paroxysmal atrial fibrillation (HCC) Assessment & Plan: Pt tachy to 109 with recent medication changes. Atrial fibrillation may cause fatigue as above. Recent medication changes include stopping metoprolol  and starting amlodipine . EKG wnl. Plan to follow up after blood work - Order lab tests including thyroid  function, electrolytes, and kidney function. - No med changes for today  Orders: -     EKG 12-Lead  Occipital neuralgia of right side Assessment & Plan: Pulsating headaches above the eye w neurology thought to be due to occipital neuralgia. Referral to neurologist scheduled. 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 3. 2 mm outpouching extending posteriorly from the supraclinoid left ICA) as this is what is primarily driving her anxiety.  - Use heating pads for symptom relief. - Attend appointment with neurologist  Dr. Bernetta Brilliant   Anxiety Assessment & Plan: Increased anxiety with confusion, disorientation, shaking, nervousness, and fatigue. Anxiety may be linked to medication changes, atrial fibrillation, or other conditions. She expressed fear and stress about her health. - Continue current anxiety management plan.  Follow up plan: Return in about 2 weeks (around 07/13/2023) for Chronic illness f/u.  Hadassah Letters, MD

## 2023-06-29 NOTE — Patient Instructions (Addendum)
 Your heart rate was elevated today so we checked an EKG (rhythm of the heart) This showed no changes from before so we did not adjust your atrial fibrillation medications.  I also got some blood work.  Please start to take amlodipine  daily 2.5mg  instead of as needed.

## 2023-06-30 ENCOUNTER — Ambulatory Visit: Payer: Self-pay | Admitting: Pediatrics

## 2023-06-30 LAB — CBC WITH DIFFERENTIAL/PLATELET
Basophils Absolute: 0 10*3/uL (ref 0.0–0.2)
Basos: 0 %
EOS (ABSOLUTE): 0.2 10*3/uL (ref 0.0–0.4)
Eos: 2 %
Hematocrit: 44.8 % (ref 34.0–46.6)
Hemoglobin: 14.2 g/dL (ref 11.1–15.9)
Immature Grans (Abs): 0 10*3/uL (ref 0.0–0.1)
Immature Granulocytes: 0 %
Lymphocytes Absolute: 1.9 10*3/uL (ref 0.7–3.1)
Lymphs: 20 %
MCH: 30 pg (ref 26.6–33.0)
MCHC: 31.7 g/dL (ref 31.5–35.7)
MCV: 95 fL (ref 79–97)
Monocytes Absolute: 0.9 10*3/uL (ref 0.1–0.9)
Monocytes: 10 %
Neutrophils Absolute: 6.3 10*3/uL (ref 1.4–7.0)
Neutrophils: 68 %
Platelets: 289 10*3/uL (ref 150–450)
RBC: 4.74 x10E6/uL (ref 3.77–5.28)
RDW: 13 % (ref 11.7–15.4)
WBC: 9.3 10*3/uL (ref 3.4–10.8)

## 2023-06-30 LAB — COMPREHENSIVE METABOLIC PANEL WITH GFR
ALT: 21 IU/L (ref 0–32)
AST: 23 IU/L (ref 0–40)
Albumin: 3.9 g/dL (ref 3.6–4.6)
Alkaline Phosphatase: 168 IU/L — ABNORMAL HIGH (ref 44–121)
BUN/Creatinine Ratio: 15 (ref 12–28)
BUN: 16 mg/dL (ref 10–36)
Bilirubin Total: 0.3 mg/dL (ref 0.0–1.2)
CO2: 25 mmol/L (ref 20–29)
Calcium: 9.9 mg/dL (ref 8.7–10.3)
Chloride: 100 mmol/L (ref 96–106)
Creatinine, Ser: 1.05 mg/dL — ABNORMAL HIGH (ref 0.57–1.00)
Globulin, Total: 2.5 g/dL (ref 1.5–4.5)
Glucose: 94 mg/dL (ref 70–99)
Potassium: 4.1 mmol/L (ref 3.5–5.2)
Sodium: 142 mmol/L (ref 134–144)
Total Protein: 6.4 g/dL (ref 6.0–8.5)
eGFR: 50 mL/min/{1.73_m2} — ABNORMAL LOW (ref >59)

## 2023-06-30 LAB — IRON,TIBC AND FERRITIN PANEL
Ferritin: 105 ng/mL (ref 15–150)
Iron Saturation: 27 % (ref 15–55)
Iron: 73 ug/dL (ref 27–139)
Total Iron Binding Capacity: 267 ug/dL (ref 250–450)
UIBC: 194 ug/dL (ref 118–369)

## 2023-06-30 LAB — FOLATE: Folate: >20 ng/mL (ref >3.0)

## 2023-06-30 LAB — TSH: TSH: 3.29 u[IU]/mL (ref 0.450–4.500)

## 2023-06-30 LAB — VITAMIN D 25 HYDROXY (VIT D DEFICIENCY, FRACTURES): Vit D, 25-Hydroxy: 44.9 ng/mL (ref 30.0–100.0)

## 2023-06-30 LAB — MAGNESIUM: Magnesium: 2.1 mg/dL (ref 1.6–2.3)

## 2023-06-30 LAB — VITAMIN B12: Vitamin B-12: 751 pg/mL (ref 232–1245)

## 2023-07-04 ENCOUNTER — Encounter: Payer: Self-pay | Admitting: Pediatrics

## 2023-07-04 DIAGNOSIS — F419 Anxiety disorder, unspecified: Secondary | ICD-10-CM | POA: Insufficient documentation

## 2023-07-04 DIAGNOSIS — M5481 Occipital neuralgia: Secondary | ICD-10-CM | POA: Insufficient documentation

## 2023-07-04 NOTE — Assessment & Plan Note (Signed)
 Blood pressure remains elevated despite medication adjustments. Home readings consistently high. - Continue amlodipine  2.5 mg daily.

## 2023-07-04 NOTE — Assessment & Plan Note (Signed)
 Increased anxiety with confusion, disorientation, shaking, nervousness, and fatigue. Anxiety may be linked to medication changes, atrial fibrillation, or other conditions. She expressed fear and stress about her health. - Continue current anxiety management plan.

## 2023-07-04 NOTE — Assessment & Plan Note (Signed)
 Pulsating headaches above the eye w neurology thought to be due to occipital neuralgia. Referral to neurologist scheduled. 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 3. 2 mm outpouching extending posteriorly from the supraclinoid left ICA) as this is what is primarily driving her anxiety.  - Use heating pads for symptom relief. - Attend appointment with neurologist Dr. Bernetta Brilliant

## 2023-07-04 NOTE — Assessment & Plan Note (Signed)
 Pt tachy to 109 with recent medication changes. Atrial fibrillation may cause fatigue as above. Recent medication changes include stopping metoprolol  and starting amlodipine . EKG wnl. Plan to follow up after blood work - Order lab tests including thyroid  function, electrolytes, and kidney function. - No med changes for today

## 2023-07-18 ENCOUNTER — Ambulatory Visit (INDEPENDENT_AMBULATORY_CARE_PROVIDER_SITE_OTHER): Admitting: Pediatrics

## 2023-07-18 ENCOUNTER — Encounter: Payer: Self-pay | Admitting: Pediatrics

## 2023-07-18 VITALS — BP 158/81 | HR 96 | Ht 59.0 in | Wt 139.4 lb

## 2023-07-18 DIAGNOSIS — I10 Essential (primary) hypertension: Secondary | ICD-10-CM

## 2023-07-18 DIAGNOSIS — Z0000 Encounter for general adult medical examination without abnormal findings: Secondary | ICD-10-CM

## 2023-07-18 DIAGNOSIS — F419 Anxiety disorder, unspecified: Secondary | ICD-10-CM | POA: Diagnosis not present

## 2023-07-18 DIAGNOSIS — F331 Major depressive disorder, recurrent, moderate: Secondary | ICD-10-CM | POA: Diagnosis not present

## 2023-07-18 MED ORDER — OLMESARTAN MEDOXOMIL 5 MG PO TABS: 5.0000 mg | ORAL_TABLET | Freq: Every day | ORAL | 2 refills | Status: DC

## 2023-07-18 MED ORDER — FLUOXETINE HCL 20 MG PO TABS: 20.0000 mg | ORAL_TABLET | Freq: Every day | ORAL | 3 refills | Status: DC

## 2023-07-18 MED ORDER — AMLODIPINE BESYLATE 5 MG PO TABS: 5.0000 mg | ORAL_TABLET | Freq: Every day | ORAL | 3 refills | Status: DC

## 2023-07-18 NOTE — Patient Instructions (Addendum)
 Changes for today: - stop amlodipine  5mg . Start a new medication called olmesartan 5mg  for blood pressure. - decrease prozac  to 20mg    Samantha [name] , Thank you for taking time to come for your Medicare Wellness Visit. I appreciate your ongoing commitment to your health goals. Please review the following plan we discussed and let me know if I can assist you in the future.   This is a list of the screening recommended for you and due dates:  Health Maintenance  Topic Date Due   Zoster (Shingles) Vaccine (1 of 2) Never done   COVID-19 Vaccine (5 - 2024-25 season) 12/22/2022   Flu Shot  09/01/2023   Medicare Annual Wellness Visit  07/17/2024   Pneumococcal Vaccine for age over 46  Completed   DEXA scan (bone density measurement)  Addressed   HPV Vaccine  Aged Out   Meningitis B Vaccine  Aged Out   DTaP/Tdap/Td vaccine  Discontinued

## 2023-07-18 NOTE — Progress Notes (Signed)
 Subjective:   Samantha [name] is a 89 y.o. female who presents for Medicare Annual (Subsequent) preventive examination.  Visit Complete: In person  Patient Medicare AWV questionnaire was completed by the patient on 07/18/23; I have confirmed that all information answered by patient is correct and no changes since this date.        Objective:    Today's Vitals   07/18/23 1413 07/18/23 1432  BP: (!) 154/85 (!) 158/81  Pulse: 97 96  Weight: 139 lb 6.4 oz (63.2 kg)   Height: 4' 11 (1.499 m)   PainSc: 0-No pain    Body mass index is 28.16 kg/m.     02/07/2023    1:57 PM 01/22/2023    3:23 PM 01/21/2023    1:43 PM 09/12/2022    6:04 PM 04/02/2021    7:47 AM 02/14/2021    9:46 PM 10/22/2020    9:36 AM  Advanced Directives  Does Patient Have a Medical Advance Directive? No  No No Yes No No  Type of Agricultural consultant;Living will    Does patient want to make changes to medical advance directive?     No - Patient declined    Copy of Healthcare Power of Attorney in Chart?     No - copy requested    Would patient like information on creating a medical advance directive?  No - Patient declined    No - Patient declined Yes (MAU/Ambulatory/Procedural Areas - Information given)    Current Medications (verified) Outpatient Encounter Medications as of 07/18/2023  Medication Sig   albuterol  (VENTOLIN  HFA) 108 (90 Base) MCG/ACT inhaler Inhale 2 puffs into the lungs every 6 (six) hours as needed for wheezing or shortness of breath.   amLODipine  (NORVASC ) 2.5 MG tablet Take 1 tablet (2.5 mg total) by mouth daily.   apixaban  (ELIQUIS ) 2.5 MG TABS tablet Take 1 tablet (2.5 mg total) by mouth 2 (two) times daily.   atorvastatin  (LIPITOR) 80 MG tablet Take 1 tablet (80 mg total) by mouth at bedtime.   Cholecalciferol  25 MCG (1000 UT) tablet Take 1,000 Units by mouth daily.   diltiazem  (CARDIZEM  CD) 120 MG 24 hr capsule Take 1 capsule (120 mg total) by mouth daily.    FLUoxetine  (PROZAC ) 40 MG capsule Take 1 capsule (40 mg total) by mouth daily.   fluticasone  (FLONASE ) 50 MCG/ACT nasal spray Place 2 sprays into both nostrils daily.   Multiple Vitamin (MULTIVITAMIN) tablet Take 1 tablet by mouth daily.   No facility-administered encounter medications on file as of 07/18/2023.    Allergies (verified) Lisinopril , Iodine, and Levaquin [levofloxacin]   History: Past Medical History:  Diagnosis Date   A-fib (HCC) 06/07/2014   Arm fracture 03/10/2017   right   Bilateral sensorineural hearing loss 05/16/2023   Brain aneurysm    CAD (coronary artery disease) 06/07/2014   Closed right radial fracture 03/21/2017   Depression 09/24/2015   Dyslipidemia 08/16/2016   Dysphonia 08/16/2016   Elevated sed rate 07/07/2020   FTT (failure to thrive) in adult 02/14/2021   Hypertension    Mild aortic stenosis 08/22/2016   Echo July 2018   Mitral regurgitation 08/22/2016   Echo July 2018   Stroke [facility])    Vertigo    Wears hearing aid in right ear    Past Surgical History:  Procedure Laterality Date   ABDOMINAL HYSTERECTOMY     APPENDECTOMY     CHOLECYSTECTOMY  FRACTURE SURGERY     Family History  Problem Relation Age of Onset   Stroke Mother    Heart attack Father    Heart disease Sister    Diabetes Sister    Heart disease Brother    Heart attack Brother    Diabetes Maternal Grandfather    Ovarian cancer Paternal Grandmother    Cancer Brother        bladder cancer   Diabetes Brother    Heart disease Brother    Hypertension Brother    Heart attack Daughter    Social History   Socioeconomic History   Marital status: Widowed    Spouse name: Not on file   Number of children: 3   Years of education: Not on file   Highest education level: Not on file  Occupational History   Not on file  Tobacco Use   Smoking status: Never   Smokeless tobacco: Never  Vaping Use   Vaping status: Never Used  Substance and Sexual Activity   Alcohol  use: No   Drug use: No   Sexual activity: Never  Other Topics Concern   Not on file  Social History Narrative   Only has 1 living child left   Social Drivers of Health   Financial Resource Strain: Medium Risk (03/28/2023)   Received from [facility]   Overall Financial Resource Strain (CARDIA)    Difficulty of Paying Living Expenses: Somewhat hard  Food Insecurity: No Food Insecurity (03/28/2023)   Received from [facility]   Hunger Vital Sign    Within the past 12 months, you worried that your food would run out before you got the money to buy more.: Never true    Within the past 12 months, the food you bought just didn't last and you didn't have money to get more.: Never true  Transportation Needs: Unmet Transportation Needs (03/28/2023)   Received from [facility] - Transportation    In the past 12 months, has lack of transportation kept you from medical appointments or from getting medications?: Yes    Lack of Transportation (Non-Medical): Yes  Physical Activity: Inactive (10/22/2020)   Exercise Vital Sign    Days of Exercise per Week: 0 days    Minutes of Exercise per Session: 0 min  Stress: No Stress Concern Present (10/22/2020)   [employer] of Occupational Health - Occupational Stress Questionnaire    Feeling of Stress : Not at all  Social Connections: Moderately Isolated (10/22/2020)   Social Connection and Isolation Panel    Frequency of Communication with Friends and Family: More than three times a week    Frequency of Social Gatherings with Friends and Family: Three times a week    Attends Religious Services: More than 4 times per year    Active Member of Clubs or Organizations: No    Attends Banker Meetings: Never    Marital Status: Widowed    Tobacco Counseling Counseling given: Not Answered   Clinical Intake:     Pain Score: 0-No pain                  Activities  of Daily Living    07/18/2023    2:15 PM 01/22/2023    3:00 PM  In your present state of health, do you have any difficulty performing the following activities:  Hearing? 1 0  Vision? 1 0  Difficulty concentrating or making decisions?  0  Walking or climbing stairs? 1   Dressing or bathing? 0   Doing errands, shopping? 1 0  Preparing Food and eating ? N   Using the Toilet? N   In the past six months, have you accidently leaked urine? N   Do you have problems with loss of bowel control? N   Managing your Medications? N   Managing your Finances? N   Housekeeping or managing your Housekeeping? Y     Patient Care Team: Hadassah Letters, MD as PCP - General (Family Medicine) Craig, Mullen M, [place] as Social Worker  Indicate any recent Medical Services you may have received from other than [facility] providers in the past year (date may be approximate).     Assessment:   This is a routine wellness examination for Samantha [name].  Hearing/Vision screen No results found.   Goals Addressed   None    Depression Screen    06/13/2023    2:41 PM 05/30/2023    1:15 PM 03/29/2023   10:53 AM 02/24/2023   12:59 PM 02/08/2023    2:03 PM 02/03/2023    2:12 PM 10/06/2022   11:57 AM  PHQ 2/9 Scores  PHQ - 2 Score 0 3 1 6 4 2  0  PHQ- 9 Score 0 13 18 25 16 8      Fall Risk    06/13/2023    2:41 PM 05/30/2023    1:15 PM 03/29/2023   10:52 AM 02/24/2023    1:00 PM 02/08/2023    2:03 PM  Fall Risk   Falls in the past year? 0 0 0 0 0  Number falls in past yr: 0 0 0  0  Injury with Fall? 0 0 0  0  Risk for fall due to : No Fall Risks No Fall Risks Impaired balance/gait;Impaired mobility;Impaired vision  Impaired balance/gait;Impaired mobility  Follow up Falls evaluation completed Falls evaluation completed   Falls evaluation completed    MEDICARE RISK AT HOME: Medicare Risk at Home Any stairs in or around the home?: Yes If so, are there any without handrails?: No Home free of loose throw rugs in  walkways, pet beds, electrical cords, etc?: No Adequate lighting in your home to reduce risk of falls?: Yes Life alert?: No Use of a cane, walker or w/c?: Yes Grab bars in the bathroom?: Yes Shower chair or bench in shower?: Yes Elevated toilet seat or a handicapped toilet?: No  TIMED UP AND GO:  Was the test performed?  Yes  Length of time to ambulate 10 feet: 8 sec Gait unsteady with use of assistive device, provider informed and education provided.     Cognitive Function:        07/18/2023    2:20 PM 10/22/2019    9:43 AM  6CIT Screen  What Year? 0 points 0 points  What month?  0 points  What time? 0 points 0 points  Count back from 20 0 points 0 points  Months in reverse  0 points  Repeat phrase 6 points 0 points  Total Score  0 points    Immunizations Immunization History  Administered Date(s) Administered   Fluad Quad(high Dose 65+) 11/08/2020   Fluzone Influenza virus vaccine,trivalent (IIV3), split virus 11/07/2012   Influenza, High Dose Seasonal PF 10/23/2013, 11/16/2018   Influenza-Unspecified 10/24/2011, 11/22/2012, 10/25/2013, 12/16/2020, 01/26/2022, 10/26/2022   Moderna Sars-Covid-2 Vaccination 03/15/2019, 04/12/2019, 12/05/2019, 10/27/2022   Pneumococcal Conjugate-13 11/20/2017   Pneumococcal Polysaccharide-23 01/31/2010   Tdap 10/24/2011    TDAP  status: Due, Education has been provided regarding the importance of this vaccine. Advised may receive this vaccine at local pharmacy or Health Dept. Aware to provide a copy of the vaccination record if obtained from local pharmacy or Health Dept. Verbalized acceptance and understanding.  Flu Vaccine status: Up to date  Pneumococcal vaccine status: Up to date  Covid-19 vaccine status: Completed vaccines  Qualifies for Shingles Vaccine? Yes   Zostavax completed No   Shingrix Completed?: No.    Education has been provided regarding the importance of this vaccine. Patient has been advised to call insurance  company to determine out of pocket expense if they have not yet received this vaccine. Advised may also receive vaccine at local pharmacy or Health Dept. Verbalized acceptance and understanding.  Screening Tests Health Maintenance  Topic Date Due   Zoster Vaccines- Shingrix (1 of 2) Never done   COVID-19 Vaccine (5 - 2024-25 season) 12/22/2022   INFLUENZA VACCINE  09/01/2023   Medicare Annual Wellness (AWV)  07/17/2024   Pneumococcal Vaccine: 50+ Years  Completed   DEXA SCAN  Addressed   HPV VACCINES  Aged Out   Meningococcal B Vaccine  Aged Out   DTaP/Tdap/Td  Discontinued    Health Maintenance  Health Maintenance Due  Topic Date Due   Zoster Vaccines- Shingrix (1 of 2) Never done   COVID-19 Vaccine (5 - 2024-25 season) 12/22/2022    Colorectal cancer screening: No longer required.   Mammogram status: No longer required due to  .     Additional Screening:  Hepatitis C Screening: does not qualify; Completed   Vision Screening: Recommended annual ophthalmology exams for early detection of glaucoma and other disorders of the eye. Is the patient up to date with their annual eye exam?  No    Dental Screening: Recommended annual dental exams for proper oral hygiene    Community Resource Referral / Chronic Care Management: CRR required this visit?  No   CCM required this visit?  No     Plan:     I have personally reviewed and noted the following in the patient's chart:   Medical and social history Use of alcohol, tobacco or illicit drugs  Current medications and supplements including opioid prescriptions. Patient is not currently taking opioid prescriptions. Functional ability and status Nutritional status Physical activity Advanced directives List of other physicians Hospitalizations, surgeries, and ER visits in previous 12 months Vitals Screenings to include cognitive, depression, and falls Referrals and appointments  In addition, I have reviewed and  discussed with patient certain preventive protocols, quality metrics, and best practice recommendations. A written personalized care plan for preventive services as well as general preventive health recommendations were provided to patient.    Encounter for Medicare annual wellness exam As above.   Essential hypertension Plan to stop amlodipine  given dual CCB thearpy (on dilt). Further, still outside goal, plan to start olmesartan 5mg . -     Olmesartan Medoxomil; Take 1 tablet (5 mg total) by mouth daily.  Dispense: 30 tablet; Refill: 2  Moderate episode of recurrent major depressive disorder (HCC) Anxiety Having some tremors with minimal benefit from medication. Plan to decrease and reassess if tremor improved. -     FLUoxetine  HCl; Take 1 tablet (20 mg total) by mouth daily.  Dispense: 90 tablet; Refill: 3    Hadassah Letters, MD   07/18/2023   After Visit Summary: (In Person-Printed) AVS printed and given to the patient

## 2023-07-18 NOTE — Progress Notes (Deleted)
 Subjective:   Samantha [name] is a 89 y.o. female who presents for Medicare Annual (Subsequent) preventive examination.  Visit Complete: {VISITMETHODVS:[phone]}  Patient Medicare AWV questionnaire was completed by the patient on ***; I have confirmed that all information answered by patient is correct and no changes since this date.        Objective:    Today's Vitals   07/18/23 1413 07/18/23 1432  BP: (!) 154/85 (!) 158/81  Pulse: 97 96  Weight: 139 lb 6.4 oz (63.2 kg)   Height: 4' 11 (1.499 m)   PainSc: 0-No pain    Body mass index is 28.16 kg/m.     02/07/2023    1:57 PM 01/22/2023    3:23 PM 01/21/2023    1:43 PM 09/12/2022    6:04 PM 04/02/2021    7:47 AM 02/14/2021    9:46 PM 10/22/2020    9:36 AM  Advanced Directives  Does Patient Have a Medical Advance Directive? No  No No Yes No No  Type of Agricultural consultant;Living will    Does patient want to make changes to medical advance directive?     No - Patient declined    Copy of Healthcare Power of Attorney in Chart?     No - copy requested    Would patient like information on creating a medical advance directive?  No - Patient declined    No - Patient declined Yes (MAU/Ambulatory/Procedural Areas - Information given)    Current Medications (verified) Outpatient Encounter Medications as of 07/18/2023  Medication Sig  . albuterol  (VENTOLIN  HFA) 108 (90 Base) MCG/ACT inhaler Inhale 2 puffs into the lungs every 6 (six) hours as needed for wheezing or shortness of breath.  . amLODipine  (NORVASC ) 2.5 MG tablet Take 1 tablet (2.5 mg total) by mouth daily.  . apixaban  (ELIQUIS ) 2.5 MG TABS tablet Take 1 tablet (2.5 mg total) by mouth 2 (two) times daily.  . atorvastatin  (LIPITOR) 80 MG tablet Take 1 tablet (80 mg total) by mouth at bedtime.  . Cholecalciferol  25 MCG (1000 UT) tablet Take 1,000 Units by mouth daily.  . diltiazem  (CARDIZEM  CD) 120 MG 24 hr capsule Take 1 capsule (120 mg total)  by mouth daily.  . FLUoxetine  (PROZAC ) 40 MG capsule Take 1 capsule (40 mg total) by mouth daily.  . fluticasone  (FLONASE ) 50 MCG/ACT nasal spray Place 2 sprays into both nostrils daily.  . Multiple Vitamin (MULTIVITAMIN) tablet Take 1 tablet by mouth daily.   No facility-administered encounter medications on file as of 07/18/2023.    Allergies (verified) Lisinopril , Iodine, and Levaquin [levofloxacin]   History: Past Medical History:  Diagnosis Date  . A-fib (HCC) 06/07/2014  . Arm fracture 03/10/2017   right  . Bilateral sensorineural hearing loss 05/16/2023  . Brain aneurysm   . CAD (coronary artery disease) 06/07/2014  . Closed right radial fracture 03/21/2017  . Depression 09/24/2015  . Dyslipidemia 08/16/2016  . Dysphonia 08/16/2016  . Elevated sed rate 07/07/2020  . FTT (failure to thrive) in adult 02/14/2021  . Hypertension   . Mild aortic stenosis 08/22/2016   Echo July 2018  . Mitral regurgitation 08/22/2016   Echo July 2018  . Stroke (HCC)   . Vertigo   . Wears hearing aid in right ear    Past Surgical History:  Procedure Laterality Date  . ABDOMINAL HYSTERECTOMY    . APPENDECTOMY    . CHOLECYSTECTOMY    . FRACTURE  SURGERY     Family History  Problem Relation Age of Onset  . Stroke Mother   . Heart attack Father   . Heart disease Sister   . Diabetes Sister   . Heart disease Brother   . Heart attack Brother   . Diabetes Maternal Grandfather   . Ovarian cancer Paternal Grandmother   . Cancer Brother        bladder cancer  . Diabetes Brother   . Heart disease Brother   . Hypertension Brother   . Heart attack Daughter    Social History   Socioeconomic History  . Marital status: Widowed    Spouse name: Not on file  . Number of children: 3  . Years of education: Not on file  . Highest education level: Not on file  Occupational History  . Not on file  Tobacco Use  . Smoking status: Never  . Smokeless tobacco: Never  Vaping Use  . Vaping  status: Never Used  Substance and Sexual Activity  . Alcohol use: No  . Drug use: No  . Sexual activity: Never  Other Topics Concern  . Not on file  Social History Narrative   Only has 1 living child left   Social Drivers of Health   Financial Resource Strain: Medium Risk (03/28/2023)   Received from [facility]   Overall Financial Resource Strain (CARDIA)   . Difficulty of Paying Living Expenses: Somewhat hard  Food Insecurity: No Food Insecurity (03/28/2023)   Received from [facility]   Hunger Vital Sign   . Within the past 12 months, you worried that your food would run out before you got the money to buy more.: Never true   . Within the past 12 months, the food you bought just didn't last and you didn't have money to get more.: Never true  Transportation Needs: Unmet Transportation Needs (03/28/2023)   Received from [facility]   [facility] - Transportation   . In the past 12 months, has lack of transportation kept you from medical appointments or from getting medications?: Yes   . Lack of Transportation (Non-Medical): Yes  Physical Activity: Inactive (10/22/2020)   Exercise Vital Sign   . Days of Exercise per Week: 0 days   . Minutes of Exercise per Session: 0 min  Stress: No Stress Concern Present (10/22/2020)   [employer] [facility] [employer] - Occupational Stress Questionnaire   . Feeling of Stress : Not at all  Social Connections: Moderately Isolated (10/22/2020)   Social Connection and Isolation Panel   . Frequency of Communication with Friends and Family: More than three times a week   . Frequency of Social Gatherings with Friends and Family: Three times a week   . Attends Religious Services: More than 4 times per year   . Active Member of Clubs or Organizations: No   . Attends Banker Meetings: Never   . Marital Status: Widowed    Tobacco Counseling Counseling given: Not Answered   Clinical  Intake:     Pain Score: 0-No pain                  Activities of Daily Living    07/18/2023    2:15 PM 01/22/2023    3:00 PM  In your present state of health, do you have any difficulty performing the following activities:  Hearing? 1 0  Vision? 1 0  Difficulty concentrating or making decisions?  0  Walking or climbing stairs? 1   Dressing or bathing? 0   Doing errands, shopping? 1 0  Preparing Food and eating ? N   Using the Toilet? N   In the past six months, have you accidently leaked urine? N   Do you have problems with loss of bowel control? N   Managing your Medications? N   Managing your Finances? N   Housekeeping or managing your Housekeeping? Y     Patient Care Team: Hadassah Letters, MD as PCP - General (Family Medicine) Mellette, Whitlock M, [place] as Social Worker  Indicate any recent Medical Services you may have received from other than [facility] providers in the past year (date may be approximate).     Assessment:   This is a routine wellness examination for Franci.  Hearing/Vision screen No results found.   Goals Addressed   None    Depression Screen    06/13/2023    2:41 PM 05/30/2023    1:15 PM 03/29/2023   10:53 AM 02/24/2023   12:59 PM 02/08/2023    2:03 PM 02/03/2023    2:12 PM 10/06/2022   11:57 AM  PHQ 2/9 Scores  PHQ - 2 Score 0 3 1 6 4 2  0  PHQ- 9 Score 0 13 18 25 16 8      Fall Risk    06/13/2023    2:41 PM 05/30/2023    1:15 PM 03/29/2023   10:52 AM 02/24/2023    1:00 PM 02/08/2023    2:03 PM  Fall Risk   Falls in the past year? 0 0 0 0 0  Number falls in past yr: 0 0 0  0  Injury with Fall? 0 0 0  0  Risk for fall due to : No Fall Risks No Fall Risks Impaired balance/gait;Impaired mobility;Impaired vision  Impaired balance/gait;Impaired mobility  Follow up Falls evaluation completed Falls evaluation completed   Falls evaluation completed    MEDICARE RISK AT HOME: Medicare Risk at Home Any stairs in or around the home?: Yes If so,  are there any without handrails?: No Home free of loose throw rugs in walkways, pet beds, electrical cords, etc?: No Adequate lighting in your home to reduce risk of falls?: Yes Life alert?: No Use of a cane, walker or w/c?: Yes Grab bars in the bathroom?: Yes Shower chair or bench in shower?: Yes Elevated toilet seat or a handicapped toilet?: No  TIMED UP AND GO:  Was the test performed?  {[phone]}    Cognitive Function:        07/18/2023    2:20 PM 10/22/2019    9:43 AM  6CIT Screen  What Year? 0 points 0 points  What month?  0 points  What time? 0 points 0 points  Count back from 20 0 points 0 points  Months in reverse  0 points  Repeat phrase 6 points 0 points  Total Score  0 points    Immunizations Immunization History  Administered Date(s) Administered  . Fluad Quad(high Dose 65+) 11/08/2020  . Fluzone Influenza virus vaccine,trivalent (IIV3), split virus 11/07/2012  . Influenza, High Dose Seasonal PF 10/23/2013, 11/16/2018  . Influenza-Unspecified 10/24/2011, 11/22/2012, 10/25/2013, 12/16/2020, 01/26/2022, 10/26/2022  . Moderna Sars-Covid-2 Vaccination 03/15/2019, 04/12/2019, 12/05/2019, 10/27/2022  . Pneumococcal Conjugate-13 11/20/2017  . Pneumococcal Polysaccharide-23 01/31/2010  . Tdap 10/24/2011    {TDAP status:2101805}  {Flu Vaccine status:2101806}  {Pneumococcal vaccine status:2101807}  {Covid-19 vaccine status:2101808}  Qualifies for Shingles Vaccine? {YES/NO:21197}  Zostavax completed {YES/NO:21197}  {  Shingrix Completed?:2101804}  Screening Tests Health Maintenance  Topic Date Due  . Zoster Vaccines- Shingrix (1 of 2) Never done  . COVID-19 Vaccine (5 - 2024-25 season) 12/22/2022  . INFLUENZA VACCINE  09/01/2023  . Medicare Annual Wellness (AWV)  07/17/2024  . Pneumococcal Vaccine: 50+ Years  Completed  . DEXA SCAN  Addressed  . HPV VACCINES  Aged Out  . Meningococcal B Vaccine  Aged Out  . DTaP/Tdap/Td  Discontinued     Health Maintenance  Health Maintenance Due  Topic Date Due  . Zoster Vaccines- Shingrix (1 of 2) Never done  . COVID-19 Vaccine (5 - 2024-25 season) 12/22/2022    {Colorectal cancer screening:2101809}  {Mammogram status:21018020}  {Bone Density status:21018021}  Lung Cancer Screening: (Low Dose CT Chest recommended if Age 102-80 years, 20 pack-year currently smoking OR have quit w/in 15years.) {DOES NOT does:27190::does not} qualify.   Lung Cancer Screening Referral: ***  Additional Screening:  Hepatitis C Screening: {DOES NOT does:27190::does not} qualify; Completed ***  Vision Screening: Recommended annual ophthalmology exams for early detection of glaucoma and other disorders of the eye. Is the patient up to date with their annual eye exam?  {YES/NO:21197} Who is the provider or what is the name of the office in which the patient attends annual eye exams? *** If pt is not established with a provider, would they like to be referred to a provider to establish care? {YES/NO:21197}.   Dental Screening: Recommended annual dental exams for proper oral hygiene  Diabetic Foot Exam: {Diabetic Foot Exam:2101802}  Community Resource Referral / Chronic Care Management: CRR required this visit?  {YES/NO:21197}  CCM required this visit?  {CCM Required choices:[phone]}     Plan:     I have personally reviewed and noted the following in the patient's chart:   Medical and social history Use of alcohol, tobacco or illicit drugs  Current medications and supplements including opioid prescriptions. {Opioid Prescriptions:[phone]} Functional ability and status Nutritional status Physical activity Advanced directives List of other physicians Hospitalizations, surgeries, and ER visits in previous 12 months Vitals Screenings to include cognitive, depression, and falls Referrals and appointments  In addition, I have reviewed and discussed with patient certain preventive  protocols, quality metrics, and best practice recommendations. A written personalized care plan for preventive services as well as general preventive health recommendations were provided to patient.     Hadassah Letters, MD   07/18/2023   After Visit Summary: {CHL AMB AWV After Visit Summary:[phone]}  Nurse Notes: ***

## 2023-07-19 DIAGNOSIS — Z8249 Family history of ischemic heart disease and other diseases of the circulatory system: Secondary | ICD-10-CM | POA: Diagnosis not present

## 2023-07-19 DIAGNOSIS — I471 Supraventricular tachycardia, unspecified: Secondary | ICD-10-CM | POA: Diagnosis not present

## 2023-07-19 DIAGNOSIS — K219 Gastro-esophageal reflux disease without esophagitis: Secondary | ICD-10-CM | POA: Diagnosis not present

## 2023-07-19 DIAGNOSIS — I4891 Unspecified atrial fibrillation: Secondary | ICD-10-CM | POA: Diagnosis not present

## 2023-07-19 DIAGNOSIS — I509 Heart failure, unspecified: Secondary | ICD-10-CM | POA: Diagnosis not present

## 2023-07-19 DIAGNOSIS — E785 Hyperlipidemia, unspecified: Secondary | ICD-10-CM | POA: Diagnosis not present

## 2023-07-19 DIAGNOSIS — M1990 Unspecified osteoarthritis, unspecified site: Secondary | ICD-10-CM | POA: Diagnosis not present

## 2023-07-19 DIAGNOSIS — I25119 Atherosclerotic heart disease of native coronary artery with unspecified angina pectoris: Secondary | ICD-10-CM | POA: Diagnosis not present

## 2023-07-19 DIAGNOSIS — F325 Major depressive disorder, single episode, in full remission: Secondary | ICD-10-CM | POA: Diagnosis not present

## 2023-07-19 DIAGNOSIS — Z008 Encounter for other general examination: Secondary | ICD-10-CM | POA: Diagnosis not present

## 2023-07-19 DIAGNOSIS — I130 Hypertensive heart and chronic kidney disease with heart failure and stage 1 through stage 4 chronic kidney disease, or unspecified chronic kidney disease: Secondary | ICD-10-CM | POA: Diagnosis not present

## 2023-07-19 DIAGNOSIS — J449 Chronic obstructive pulmonary disease, unspecified: Secondary | ICD-10-CM | POA: Diagnosis not present

## 2023-07-19 DIAGNOSIS — D6869 Other thrombophilia: Secondary | ICD-10-CM | POA: Diagnosis not present

## 2023-07-19 DIAGNOSIS — H524 Presbyopia: Secondary | ICD-10-CM | POA: Diagnosis not present

## 2023-07-24 DIAGNOSIS — M7918 Myalgia, other site: Secondary | ICD-10-CM | POA: Diagnosis not present

## 2023-07-24 DIAGNOSIS — M5481 Occipital neuralgia: Secondary | ICD-10-CM | POA: Diagnosis not present

## 2023-07-24 DIAGNOSIS — I671 Cerebral aneurysm, nonruptured: Secondary | ICD-10-CM | POA: Diagnosis not present

## 2023-07-24 DIAGNOSIS — F419 Anxiety disorder, unspecified: Secondary | ICD-10-CM | POA: Diagnosis not present

## 2023-08-23 ENCOUNTER — Ambulatory Visit (INDEPENDENT_AMBULATORY_CARE_PROVIDER_SITE_OTHER): Admitting: Pediatrics

## 2023-08-23 VITALS — BP 180/90 | HR 91 | Temp 97.5°F | Wt 136.2 lb

## 2023-08-23 DIAGNOSIS — F331 Major depressive disorder, recurrent, moderate: Secondary | ICD-10-CM | POA: Diagnosis not present

## 2023-08-23 DIAGNOSIS — G4710 Hypersomnia, unspecified: Secondary | ICD-10-CM | POA: Diagnosis not present

## 2023-08-23 DIAGNOSIS — R070 Pain in throat: Secondary | ICD-10-CM

## 2023-08-23 DIAGNOSIS — I480 Paroxysmal atrial fibrillation: Secondary | ICD-10-CM

## 2023-08-23 DIAGNOSIS — I10 Essential (primary) hypertension: Secondary | ICD-10-CM

## 2023-08-23 DIAGNOSIS — F419 Anxiety disorder, unspecified: Secondary | ICD-10-CM | POA: Diagnosis not present

## 2023-08-23 MED ORDER — DILTIAZEM HCL ER COATED BEADS 180 MG PO CP24: 180.0000 mg | ORAL_CAPSULE | Freq: Every day | ORAL | 3 refills | Status: DC

## 2023-08-23 NOTE — Patient Instructions (Addendum)
 Stop benicar , restart  If taking prozac  20mg , you can go back up to 40mg 

## 2023-08-23 NOTE — Progress Notes (Signed)
 Office Visit  BP (!) 180/90   Pulse 91   Temp (!) 97.5 F (36.4 C) (Oral)   Wt 136 lb 3.2 oz (61.8 kg)   SpO2 97%   BMI 27.51 kg/m    Subjective:    Patient ID: Samantha [name], female    DOB: 1928-08-05, 89 y.o.   MRN: [id number]  HPI: Samantha [name] is a 89 y.o. female  Chief Complaint  Patient presents with   Fatigue    Sleeps 24hrs a day believes its the medication she was recently prescribed     Discussed the use of AI scribe software for clinical note transcription with the patient, who gave verbal consent to proceed.  History of Present Illness   Samantha [name] is a 89 year old female who presents with increased sleepiness and medication confusion.  She experiences significant sleepiness, sleeping up to 24 hours a day, with difficulty staying awake and performing daily activities. Her sleep pattern includes waking every few hours to use the bathroom before returning to sleep. She emphasizes that her primary issue is sleepiness rather than fatigue or shortness of breath.  There is confusion regarding her medication regimen. She recently started two new medications, one for blood pressure and a reduced dose of Prozac  from 40 mg to 20 mg. She is unsure if she is taking the correct doses or if there is a mix-up. Her blood pressure is very high, and she is concerned about whether the new medications are contributing to her increased sleepiness.  She has a history of a throat issue, particularly at night, where she feels unable to clear her throat, leading to a previous 911 call due to difficulty breathing. This was thought to be related to lisinopril , which was stopped. She describes a sensation of something being stuck in her throat and an inability to clear it when coughing.  She lives alone and manages her medications independently, with occasional assistance from a caregiver. She is frustrated with an outdated medication list, which may contribute to her confusion. She  reports no pain or shortness of breath, and clarifies that her primary issue is sleepiness rather than fatigue. She reports difficulty clearing her throat, particularly at night, and has experienced episodes of choking.        Relevant past medical, surgical, family and social history reviewed and updated as indicated. Interim medical history since our last visit reviewed. Allergies and medications reviewed and updated.  ROS per HPI unless specifically indicated above     Objective:    BP (!) 180/90   Pulse 91   Temp (!) 97.5 F (36.4 C) (Oral)   Wt 136 lb 3.2 oz (61.8 kg)   SpO2 97%   BMI 27.51 kg/m   Wt Readings from Last 3 Encounters:  08/23/23 136 lb 3.2 oz (61.8 kg)  07/18/23 139 lb 6.4 oz (63.2 kg)  06/29/23 141 lb (64 kg)     Physical Exam Constitutional:      Appearance: Normal appearance.  HENT:     Mouth/Throat:     Mouth: Mucous membranes are moist.     Pharynx: Oropharynx is clear.  Cardiovascular:     Rate and Rhythm: Normal rate and regular rhythm.     Pulses: Normal pulses.     Heart sounds: Normal heart sounds.  Pulmonary:     Effort: Pulmonary effort is normal.  Musculoskeletal:        General: Normal range of motion.     Cervical  back: Normal range of motion.  Skin:    Comments: Normal skin color  Neurological:     General: No focal deficit present.     Mental Status: She is alert. Mental status is at baseline.  Psychiatric:        Mood and Affect: Mood normal.        Behavior: Behavior normal.        Thought Content: Thought content normal.         08/23/2023    3:15 PM 06/13/2023    2:41 PM 05/30/2023    1:15 PM 03/29/2023   10:53 AM 02/24/2023   12:59 PM  Depression screen PHQ 2/9  Decreased Interest 3 0 2 0 3  Down, Depressed, Hopeless 3 0 1 1 3   PHQ - 2 Score 6 0 3 1 6   Altered sleeping 3 0 3 3 3   Tired, decreased energy 3 0 3 3 3   Change in appetite 3 0 1 3 3   Feeling bad or failure about yourself  3 0 1 2 3   Trouble  concentrating 3 0 1 2 3   Moving slowly or fidgety/restless 3 0  3 3  Suicidal thoughts 3 0 1 1 1   PHQ-9 Score 27 0 13 18 25   Difficult doing work/chores  Not difficult at all Not difficult at all Extremely dIfficult Very difficult       08/23/2023    3:15 PM 06/13/2023    2:42 PM 05/30/2023    1:16 PM 03/29/2023   10:53 AM  GAD 7 : Generalized Anxiety Score  Nervous, Anxious, on Edge 0 0 1 3  Control/stop worrying 0 0 1 3  Worry too much - different things 0 0 1 3  Trouble relaxing 0 0 1 3  Restless 0 0 1 0  Easily annoyed or irritable 0 0 1 2  Afraid - awful might happen 0 0 1 0  Total GAD 7 Score 0 0 7 14  Anxiety Difficulty   Somewhat difficult Extremely difficult       Assessment & Plan:  Assessment & Plan   Excessive sleepiness Increased sleepiness and fatigue may be linked to medication changes, including reduced Prozac  and added olmesartan . The reduction in Prozac  aimed to boost energy but may have been counterproductive. Stop olmesartan  and increase diltiazem  dosage. Verify Prozac  dosage at home and call the office with the information. If the Prozac  dosage is 20 mg, consider increasing it back to 40 mg. Provide an updated medication list and schedule a follow-up in 2-3 weeks.  Essential hypertension Blood pressure has been significantly elevated for a week. Olmesartan  may contribute to sleepiness, necessitating adjustments for effective management. Stop olmesartan  and increase diltiazem  dosage. Schedule a follow-up in 2-3 weeks. -     dilTIAZem  HCl ER Coated Beads; Take 1 capsule (180 mg total) by mouth daily.  Dispense: 90 capsule; Refill: 3  Paroxysmal atrial fibrillation (HCC) Increasing as discussed above -     dilTIAZem  HCl ER Coated Beads; Take 1 capsule (180 mg total) by mouth daily.  Dispense: 90 capsule; Refill: 3  Moderate episode of recurrent major depressive disorder (HCC) Anxiety Worsened with recent change, plan to goback up to 40mg  if taking 20mg .  Consider increasing at f/u. -     FLUoxetine  HCl; Take 2 tablets (40 mg total) by mouth daily.  Dispense: 180 tablet; Refill: 3  Throat discomfort She experiences difficulty clearing her throat and a sensation of obstruction. Had one episode severe enough to  call ambulance. Unremarkable exam. Refer to ENT for further evaluation. -     Ambulatory referral to ENT  Follow up plan: Return in about 2 weeks (around 09/06/2023).  Hadassah SHAUNNA Nett, MD

## 2023-08-24 ENCOUNTER — Telehealth: Payer: Self-pay

## 2023-08-24 NOTE — Telephone Encounter (Signed)
 Copied from CRM [id number]. Topic: Clinical - Medication Question >> Aug 24, 2023  2:33 PM Selinda RAMAN wrote: Reason for CRM: Lu the close friend, contact and person who went with her to her appointment yesterday called back in per request of the provider stating the patient has been taking the  FLUoxetine  (PROZAC ) 20 MG tablet and not the 40 MG but she will remind her that the provider said for her to go back to the 40. She will also pick up the diltiazem  (CARDIZEM  CD) 180 MG 24 hr capsule from the drug store. Please assist patient further.  Please advise?

## 2023-08-25 NOTE — Telephone Encounter (Signed)
 Called and notified Samantha [name] regarding patients medication voiced understanding also mentioned the day of pts appointment they had done a lot of running around and by the end of the evening patient lips had turned very blue patient wanted to just rest pt is fine now just wanted to inform the provider

## 2023-08-28 ENCOUNTER — Encounter: Payer: Self-pay | Admitting: Pediatrics

## 2023-08-28 MED ORDER — FLUOXETINE HCL 20 MG PO TABS: 40.0000 mg | ORAL_TABLET | Freq: Every day | ORAL | 3 refills | Status: AC

## 2023-09-13 ENCOUNTER — Ambulatory Visit (INDEPENDENT_AMBULATORY_CARE_PROVIDER_SITE_OTHER): Admitting: Pediatrics

## 2023-09-13 VITALS — BP 156/88 | HR 114 | Temp 97.4°F | Wt 138.4 lb

## 2023-09-13 DIAGNOSIS — Z59819 Housing instability, housed unspecified: Secondary | ICD-10-CM

## 2023-09-13 DIAGNOSIS — I10 Essential (primary) hypertension: Secondary | ICD-10-CM | POA: Diagnosis not present

## 2023-09-13 DIAGNOSIS — I480 Paroxysmal atrial fibrillation: Secondary | ICD-10-CM | POA: Diagnosis not present

## 2023-09-13 DIAGNOSIS — F331 Major depressive disorder, recurrent, moderate: Secondary | ICD-10-CM

## 2023-09-13 DIAGNOSIS — G4710 Hypersomnia, unspecified: Secondary | ICD-10-CM

## 2023-09-13 DIAGNOSIS — R351 Nocturia: Secondary | ICD-10-CM

## 2023-09-13 LAB — URINALYSIS, ROUTINE W REFLEX MICROSCOPIC
Bilirubin, UA: NEGATIVE
Glucose, UA: NEGATIVE
Leukocytes,UA: NEGATIVE
Nitrite, UA: NEGATIVE
RBC, UA: NEGATIVE
Specific Gravity, UA: >1.03 — ABNORMAL HIGH (ref 1.005–1.030)
Urobilinogen, Ur: 0.2 mg/dL (ref 0.2–1.0)
pH, UA: 5.5 (ref 5.0–7.5)

## 2023-09-13 LAB — MICROSCOPIC EXAMINATION: RBC, Urine: NONE SEEN /HPF (ref 0–2)

## 2023-09-13 NOTE — Progress Notes (Signed)
 Office Visit  BP (!) 156/88   Pulse (!) 114   Temp (!) 97.4 F (36.3 C) (Oral)   Wt 138 lb 6.4 oz (62.8 kg)   SpO2 94%   BMI 27.95 kg/m    Subjective:    Patient ID: Samantha [name], female    DOB: 1928-02-23, 89 y.o.   MRN: [id number]  HPI: Samantha [name] is a 89 y.o. female  No chief complaint on file.   Discussed the use of AI scribe software for clinical note transcription with the patient, who gave verbal consent to proceed.  History of Present Illness   Samantha [name] is a 89 year old female who presents with increased urinary frequency and excessive sleepiness.  She has been experiencing increased urinary frequency for the past six weeks, urinating seven to ten times a night. The urine varies in color from very yellow to colorless. No dysuria is present, and she has not had similar symptoms previously. Occasional incontinence occurs, leading to frequent changes of underwear at night. No history of urinary tract infections.  She reports excessive sleepiness, feeling unable to complete tasks due to fatigue. She has a history of sleep apnea and previously did not want to wear the CPAP mask. She often falls asleep in her chair and needs to lie down frequently.  Her blood pressure has been elevated at home over the past week, though it was lower during today's visit. She is taking Prozac  at a dose of 40 mg, which was recently increased, but has not noticed any improvement in her symptoms.  She is under significant stress due to landlord issues, including rent increases and threats of eviction. She has lived in her current condo for 23 years and is unable to move due to her physical condition. She feels overwhelmed and stressed by these circumstances.  She experiences intermittent tremors, described as 'the shakes,' which worsen with anxiety. She has a history of amiodarone use and thyroid  issues, but these have been ruled out as causes for the tremors.         09/14/2023     5:00 PM  Results of the Epworth flowsheet  Sitting and reading 3  Watching TV 3  Sitting, inactive in a public place (e.g. a theatre or a meeting) 3  As a passenger in a car for an hour without a break 3  Lying down to rest in the afternoon when circumstances permit 3  Sitting and talking to someone 3  Sitting quietly after a lunch without alcohol 3  In a car, while stopped for a few minutes in traffic 3  Total score 24     Relevant past medical, surgical, family and social history reviewed and updated as indicated. Interim medical history since our last visit reviewed. Allergies and medications reviewed and updated.  ROS per HPI unless specifically indicated above     Objective:    BP (!) 156/88   Pulse (!) 114   Temp (!) 97.4 F (36.3 C) (Oral)   Wt 138 lb 6.4 oz (62.8 kg)   SpO2 94%   BMI 27.95 kg/m   Wt Readings from Last 3 Encounters:  09/13/23 138 lb 6.4 oz (62.8 kg)  08/23/23 136 lb 3.2 oz (61.8 kg)  07/18/23 139 lb 6.4 oz (63.2 kg)     Physical Exam Constitutional:      Appearance: Normal appearance.  Cardiovascular:     Rate and Rhythm: Tachycardia present.  Pulmonary:     Effort:  Pulmonary effort is normal.  Musculoskeletal:        General: Normal range of motion.  Skin:    Comments: Normal skin color  Neurological:     General: No focal deficit present.     Mental Status: She is alert. Mental status is at baseline.  Psychiatric:        Mood and Affect: Mood normal.        Behavior: Behavior normal.        Thought Content: Thought content normal.         09/13/2023    2:29 PM 08/23/2023    3:15 PM 06/13/2023    2:41 PM 05/30/2023    1:15 PM 03/29/2023   10:53 AM  Depression screen PHQ 2/9  Decreased Interest 3 3 0 2 0  Down, Depressed, Hopeless 3 3 0 1 1  PHQ - 2 Score 6 6 0 3 1  Altered sleeping 3 3 0 3 3  Tired, decreased energy 3 3 0 3 3  Change in appetite 3 3 0 1 3  Feeling bad or failure about yourself  3 3 0 1 2  Trouble  concentrating 3 3 0 1 2  Moving slowly or fidgety/restless 3 3 0  3  Suicidal thoughts 3 3 0 1 1  PHQ-9 Score 27 27 0 13 18  Difficult doing work/chores Not difficult at all  Not difficult at all Not difficult at all Extremely dIfficult       09/13/2023    2:29 PM 08/23/2023    3:15 PM 06/13/2023    2:42 PM 05/30/2023    1:16 PM  GAD 7 : Generalized Anxiety Score  Nervous, Anxious, on Edge 3 0 0 1  Control/stop worrying 3 0 0 1  Worry too much - different things 3 0 0 1  Trouble relaxing 3 0 0 1  Restless 3 0 0 1  Easily annoyed or irritable 3 0 0 1  Afraid - awful might happen 3 0 0 1  Total GAD 7 Score 21 0 0 7  Anxiety Difficulty Extremely difficult   Somewhat difficult       Assessment & Plan:  Assessment & Plan   Essential hypertension Assessment & Plan: Hypertension poorly controlled despite medication. Stress and psychosocial factors may contribute. Discontinued olmesartan  in hopes would improve fatigue which she reported worsened with this medication change previously. Continues to feel fatigue/severe sleepiness. Blood pressure improved today but suboptimal at home. Recommend she increase dilt to 240mg .  - Consult cardiologist to move up visit - sleep study as discussed below  Orders: -     dilTIAZem  HCl ER Coated Beads; Take 1 capsule (240 mg total) by mouth daily.  Dispense: 90 capsule; Refill: 3  Paroxysmal atrial fibrillation (HCC) Assessment & Plan: Tachycardic initially, on repeat in high 90s low 100s. Suspect higher dose dilt will help with this. Recommend she is evaluated by cardiology earlier than currently scheduled.   Orders: -     dilTIAZem  HCl ER Coated Beads; Take 1 capsule (240 mg total) by mouth daily.  Dispense: 90 capsule; Refill: 3  Excessive sleepiness Daytime sleepiness due to untreated sleep apnea. Previous non-compliance with CPAP. - unable to find sleep study, will send for home sleep testing  - Discuss CPAP mask options. -     Ambulatory  referral to Sleep Studies  Moderate episode of recurrent major depressive disorder (HCC) Depression exacerbated by psychosocial stressors. Prozac  40 mg with no improvement. Significant distress due  to housing situation. - Maintain current medication regimen.  Frequent urination at night New onset nocturia and urinary incontinence without dysuria or UTI history. Differential includes UTI and hypertension compensation. - Order urinalysis. - Check kidney function. -     Basic metabolic panel with GFR -     Urine Culture -     Urinalysis, Routine w reflex microscopic -     Microscopic Examination  Housing instability Landlord threatening to evict. She is not sure about housing options. Will place SDOH referral to assist. -     AMB Referral VBCI Care Management   Follow up plan: 2-3 weeks  Hadassah SHAUNNA Nett, MD

## 2023-09-14 ENCOUNTER — Encounter: Payer: Self-pay | Admitting: Pediatrics

## 2023-09-14 ENCOUNTER — Telehealth: Payer: Self-pay

## 2023-09-14 ENCOUNTER — Ambulatory Visit: Payer: Self-pay | Admitting: Pediatrics

## 2023-09-14 ENCOUNTER — Other Ambulatory Visit: Payer: Self-pay | Admitting: Pediatrics

## 2023-09-14 DIAGNOSIS — G4710 Hypersomnia, unspecified: Secondary | ICD-10-CM | POA: Insufficient documentation

## 2023-09-14 DIAGNOSIS — I480 Paroxysmal atrial fibrillation: Secondary | ICD-10-CM

## 2023-09-14 DIAGNOSIS — I10 Essential (primary) hypertension: Secondary | ICD-10-CM

## 2023-09-14 DIAGNOSIS — N1832 Chronic kidney disease, stage 3b: Secondary | ICD-10-CM | POA: Insufficient documentation

## 2023-09-14 LAB — BASIC METABOLIC PANEL WITH GFR
BUN/Creatinine Ratio: 13 (ref 12–28)
BUN: 17 mg/dL (ref 10–36)
CO2: 25 mmol/L (ref 20–29)
Calcium: 9.9 mg/dL (ref 8.7–10.3)
Chloride: 104 mmol/L (ref 96–106)
Creatinine, Ser: 1.26 mg/dL — ABNORMAL HIGH (ref 0.57–1.00)
Glucose: 85 mg/dL (ref 70–99)
Potassium: 4.3 mmol/L (ref 3.5–5.2)
Sodium: 145 mmol/L — ABNORMAL HIGH (ref 134–144)
eGFR: 40 mL/min/1.73 — ABNORMAL LOW (ref >59)

## 2023-09-14 MED ORDER — DILTIAZEM HCL ER COATED BEADS 180 MG PO CP24
180.0000 mg | ORAL_CAPSULE | Freq: Every day | ORAL | 2 refills | Status: DC
Start: 2023-09-14 — End: 2023-10-03

## 2023-09-14 MED ORDER — DILTIAZEM HCL ER COATED BEADS 240 MG PO CP24: 240.0000 mg | ORAL_CAPSULE | Freq: Every day | ORAL | 3 refills | Status: DC

## 2023-09-14 NOTE — Progress Notes (Signed)
 Should be on 180mg . Per Caregiver has been taking 120mg .

## 2023-09-14 NOTE — Telephone Encounter (Signed)
 Copied from CRM #[id number]. Topic: Clinical - Medication Question >> Sep 14, 2023  3:37 PM Tobias L wrote: Reason for CRM: Lu calling to get clarification if okay jump to 240mg  diltiazem , patient has not been taking 180mg  as prescribed. Patient has been taking 120mg , patient's bottle is dated 08/17/23 with 120mg .   Wanting to know if okay for patient to switch to 240 mg since she was taking the 120mg  of diltiazem .   Please reach out to Lu, [phone]

## 2023-09-14 NOTE — Assessment & Plan Note (Addendum)
 Hypertension poorly controlled despite medication. Stress and psychosocial factors may contribute. Discontinued olmesartan  in hopes would improve fatigue which she reported worsened with this medication change previously. Continues to feel fatigue/severe sleepiness. Blood pressure improved today but suboptimal at home. Recommend she increase dilt to 240mg .  - Consult cardiologist to move up visit - sleep study as discussed below

## 2023-09-14 NOTE — Progress Notes (Signed)
 Nephrology referral placed for worsened renal function. Now meeting CKD3 criteria.  Suspect at least in part due to poorly controlled HTN.   Hadassah SHAUNNA Nett, MD

## 2023-09-14 NOTE — Assessment & Plan Note (Signed)
 Tachycardic initially, on repeat in high 90s low 100s. Suspect higher dose dilt will help with this. Recommend she is evaluated by cardiology earlier than currently scheduled.

## 2023-09-15 ENCOUNTER — Telehealth: Payer: Self-pay | Admitting: *Deleted

## 2023-09-15 LAB — URINE CULTURE

## 2023-09-15 NOTE — Progress Notes (Signed)
 Complex Care Management Note  Care Guide Note 09/15/2023 Name: Samantha [name] MRN: [id number] DOB: 07-21-1928  Samantha [name] is a 89 y.o. year old female who sees Herold, Hadassah SQUIBB, MD for primary care. I reached out to Cathlean FORBES O[place] by phone today to offer complex care management services.  [name] was given information about Complex Care Management services today including:   The Complex Care Management services include support from the care team which includes your Nurse Care Manager, Clinical Social Worker, or Pharmacist.  The Complex Care Management team is here to help remove barriers to the health concerns and goals most important to you. Complex Care Management services are voluntary, and the patient may decline or stop services at any time by request to their care team member.   Complex Care Management Consent Status: Patient agreed to services and verbal consent obtained.   Follow up plan:  Telephone appointment with complex care management team member scheduled for:  8/20  Encounter Outcome:  Patient Scheduled  Harlene Satterfield  [facility]  [facility], [facility] Guide  Direct Dial: [phone]  Fax [phone]

## 2023-09-15 NOTE — Telephone Encounter (Signed)
 Called Lu and provided updated information stated that she will inform patient

## 2023-09-20 ENCOUNTER — Telehealth: Payer: Self-pay | Admitting: Licensed Clinical Social Worker

## 2023-09-20 ENCOUNTER — Encounter: Payer: Self-pay | Admitting: Licensed Clinical Social Worker

## 2023-10-03 ENCOUNTER — Encounter: Payer: Self-pay | Admitting: Pediatrics

## 2023-10-03 ENCOUNTER — Ambulatory Visit (INDEPENDENT_AMBULATORY_CARE_PROVIDER_SITE_OTHER): Admitting: Pediatrics

## 2023-10-03 VITALS — BP 186/103 | HR 91 | Temp 97.9°F | Wt 136.4 lb

## 2023-10-03 DIAGNOSIS — I10 Essential (primary) hypertension: Secondary | ICD-10-CM

## 2023-10-03 DIAGNOSIS — Z23 Encounter for immunization: Secondary | ICD-10-CM

## 2023-10-03 DIAGNOSIS — I480 Paroxysmal atrial fibrillation: Secondary | ICD-10-CM | POA: Diagnosis not present

## 2023-10-03 DIAGNOSIS — Z658 Other specified problems related to psychosocial circumstances: Secondary | ICD-10-CM | POA: Diagnosis not present

## 2023-10-03 MED ORDER — DILTIAZEM HCL ER COATED BEADS 240 MG PO CP24: 240.0000 mg | ORAL_CAPSULE | Freq: Every day | ORAL | 2 refills | Status: AC

## 2023-10-03 NOTE — Progress Notes (Signed)
 Office Visit  BP (!) 186/103   Pulse 91   Temp 97.9 F (36.6 C) (Oral)   Wt 136 lb 6.4 oz (61.9 kg)   SpO2 92%   BMI 27.55 kg/m    Subjective:    Patient ID: Samantha [name], female    DOB: 03-Jan-1929, 89 y.o.   MRN: [id number]  HPI: Samantha [name] is a 89 y.o. female  Chief Complaint  Patient presents with   Hypertension    Pt also believes she broke her toe     #HTN Continues to check at home  Confirmed dosing with dilt 180 No chest pain, sob, palpations, orthopnea, dyspnea, PND, lower extremity edema  #FOOT PAIN Intermittent left leg pain She's not sure if she has a strain No swelling, bruising Baseline difficult gait  #PSYCHOSOCIAL STRESSOR Discussed stressors over housing and fears will be kicked out of housing This is causing significant distress and does not know what to do  Relevant past medical, surgical, family and social history reviewed and updated as indicated. Interim medical history since our last visit reviewed. Allergies and medications reviewed and updated.  ROS per HPI unless specifically indicated above     Objective:    BP (!) 186/103   Pulse 91   Temp 97.9 F (36.6 C) (Oral)   Wt 136 lb 6.4 oz (61.9 kg)   SpO2 92%   BMI 27.55 kg/m   Wt Readings from Last 3 Encounters:  10/03/23 136 lb 6.4 oz (61.9 kg)  09/13/23 138 lb 6.4 oz (62.8 kg)  08/23/23 136 lb 3.2 oz (61.8 kg)     Physical Exam Constitutional:      Appearance: Normal appearance.  Pulmonary:     Effort: Pulmonary effort is normal.  Musculoskeletal:        General: Normal range of motion.  Skin:    Comments: Normal skin color  Neurological:     General: No focal deficit present.     Mental Status: She is alert. Mental status is at baseline.  Psychiatric:        Mood and Affect: Mood normal.        Behavior: Behavior normal.        Thought Content: Thought content normal.         10/05/2023    2:17 PM 10/03/2023    2:35 PM 09/13/2023    2:29 PM 08/23/2023     3:15 PM 06/13/2023    2:41 PM  Depression screen PHQ 2/9  Decreased Interest 2 3 3 3  0  Down, Depressed, Hopeless 2 3 3 3  0  PHQ - 2 Score 4 6 6 6  0  Altered sleeping 1 3 3 3  0  Tired, decreased energy 1 3 3 3  0  Change in appetite 0 3 3 3  0  Feeling bad or failure about yourself  1 3 3 3  0  Trouble concentrating 0 3 3 3  0  Moving slowly or fidgety/restless 1 3 3 3  0  Suicidal thoughts 0 3 3 3  0  PHQ-9 Score 8 27 27 27  0  Difficult doing work/chores Somewhat difficult Very difficult Not difficult at all  Not difficult at all       10/05/2023    2:40 PM 10/03/2023    2:36 PM 09/13/2023    2:29 PM 08/23/2023    3:15 PM  GAD 7 : Generalized Anxiety Score  Nervous, Anxious, on Edge 1 3 3  0  Control/stop worrying 1 3 3  0  Worry too much - different things 2 3 3  0  Trouble relaxing 1 3 3  0  Restless 1 3 3  0  Easily annoyed or irritable 1 3 3  0  Afraid - awful might happen 1 3 3  0  Total GAD 7 Score 8 21 21  0  Anxiety Difficulty Somewhat difficult Very difficult Extremely difficult        Assessment & Plan:  Assessment & Plan   Paroxysmal atrial fibrillation (HCC) Assessment & Plan: Intermittent HR in low 100s at home, suspect will improve further with higher dilt dose.  Orders: -     dilTIAZem  HCl ER Coated Beads; Take 1 capsule (240 mg total) by mouth daily.  Dispense: 30 capsule; Refill: 2  Essential hypertension Assessment & Plan: Continues to be outside goal though could be exacerbated by recent stress. Plan to have her increase dilt to 240. I do think she may also benefit from moving up her cardiology visit.  Orders: -     dilTIAZem  HCl ER Coated Beads; Take 1 capsule (240 mg total) by mouth daily.  Dispense: 30 capsule; Refill: 2  Psychosocial stressors Scheduled with social work. Will do research on housing protections and call her. Recommend she keep upcoming visit.    Follow up plan: Return in about 4 weeks (around 10/31/2023) for HTN.  Hadassah SHAUNNA Nett,  MD

## 2023-10-03 NOTE — Patient Instructions (Signed)
Increase diltiazem to 240 mg daily

## 2023-10-05 ENCOUNTER — Other Ambulatory Visit: Payer: Self-pay | Admitting: Licensed Clinical Social Worker

## 2023-10-05 NOTE — Patient Outreach (Signed)
 Complex Care Management   Visit Note  10/05/2023  Name:  Samantha [name] MRN: [id number] DOB: August 25, 1928  Situation: Referral received for Complex Care Management related to Mental/Behavioral Health diagnosis anxiety. I obtained verbal consent from Patient.  Visit completed with Caregiver Patient  on the phone  Background:   Past Medical History:  Diagnosis Date   A-fib (HCC) 06/07/2014   Arm fracture 03/10/2017   right   Bilateral sensorineural hearing loss 05/16/2023   Brain aneurysm    CAD (coronary artery disease) 06/07/2014   Closed right radial fracture 03/21/2017   Depression 09/24/2015   Dyslipidemia 08/16/2016   Dysphonia 08/16/2016   Elevated sed rate 07/07/2020   FTT (failure to thrive) in adult 02/14/2021   Hypertension    Mild aortic stenosis 08/22/2016   Echo July 2018   Mitral regurgitation 08/22/2016   Echo July 2018   Stroke [facility])    Vertigo    Wears hearing aid in right ear     Assessment: Patient Reported Symptoms:  Cognitive Cognitive Status: Able to follow simple commands, Alert and oriented to person, place, and time Cognitive/Intellectual Conditions Management [RPT]: None reported or documented in medical history or problem list   Health Maintenance Behaviors: Annual physical exam Healing Pattern: Average Health Facilitated by: Rest, Stress management  Neurological Neurological Review of Symptoms: No symptoms reported Neurological Management Strategies: Adequate rest Neurological Self-Management Outcome: 4 (good) Neurological Comment: Family was encouraged to call for an earlier neurology appt follow up  Cardiovascular Cardiovascular Symptoms Reported: No symptoms reported    Respiratory Respiratory Symptoms Reported: No symptoms reported    Psychosocial Psychosocial Symptoms Reported: Anxiety - if selected complete GAD Additional Psychological Details: BH support was offered Behavioral Management Strategies: Adequate rest, Medication  therapy   Quality of Family Relationships: involved, helpful Do you feel physically threatened by others?: No    10/05/2023    PHQ2-9 Depression Screening   Little interest or pleasure in doing things More than half the days  Feeling down, depressed, or hopeless More than half the days  PHQ-2 - Total Score 4  Trouble falling or staying asleep, or sleeping too much Several days  Feeling tired or having little energy Several days  Poor appetite or overeating  Not at all  Feeling bad about yourself - or that you are a failure or have let yourself or your family down Several days  Trouble concentrating on things, such as reading the newspaper or watching television Not at all  Moving or speaking so slowly that other people could have noticed.  Or the opposite - being so fidgety or restless that you have been moving around a lot more than usual Several days  Thoughts that you would be better off dead, or hurting yourself in some way Not at all  PHQ2-9 Total Score 8  If you checked off any problems, how difficult have these problems made it for you to do your work, take care of things at home, or get along with other people Somewhat difficult  Depression Interventions/Treatment Currently on Treatment, Community Resources Provided, Patient refuses Treatment    There were no vitals filed for this visit.  Medications Reviewed Today     Reviewed by Merlynn Lyle CROME, LCSW (Social Worker) on 10/05/23 at 1410  Med List Status: <None>   Medication Order Taking? Sig Documenting Provider Last Dose Status Informant  albuterol  (VENTOLIN  HFA) 108 (90 Base) MCG/ACT inhaler [id number]  Inhale 2 puffs into the lungs every 6 (six) hours  as needed for wheezing or shortness of breath. Caleen Qualia, MD  Active   apixaban  (ELIQUIS ) 2.5 MG TABS tablet [id number]  Take 1 tablet (2.5 mg total) by mouth 2 (two) times daily. Bernardo Fend, DO  Active Self, Pharmacy Records  atorvastatin  (LIPITOR) 80 MG tablet  [id number]  Take 1 tablet (80 mg total) by mouth at bedtime. Herold Hadassah SQUIBB, MD  Active   Cholecalciferol  25 MCG (1000 UT) tablet [id number]  Take 1,000 Units by mouth daily. [provider]  Active Self, Pharmacy Records           Med Note VASHTI, MARGARIE D   Tue Oct 22, 2019  9:31 AM)    diltiazem  (CARDIZEM  CD) 240 MG 24 hr capsule [id number]  Take 1 capsule (240 mg total) by mouth daily. Herold Hadassah SQUIBB, MD  Active   FLUoxetine  (PROZAC ) 20 MG tablet [id number]  Take 2 tablets (40 mg total) by mouth daily. Herold Hadassah SQUIBB, MD  Active   fluticasone  (FLONASE ) 50 MCG/ACT nasal spray [id number]  Place 2 sprays into both nostrils daily. Herold Hadassah SQUIBB, MD  Active   Multiple Vitamin (MULTIVITAMIN) tablet [id number]  Take 1 tablet by mouth daily. [provider]  Active Self, Pharmacy Records            Recommendation:   PCP Follow-up Continue Current Plan of Care  Follow Up Plan:   Patient has met all care management goals. Care Management case will be closed. Patient has been provided contact information should new needs arise.   Lyle Rung, BSW, MSW, LCSW Licensed Clinical Social Worker [employer]   [facility] [email or web address] Direct Dial: [phone]

## 2023-10-05 NOTE — Patient Instructions (Signed)
 Visit Information  Thank you for taking time to visit with me today. Please don't hesitate to contact me if I can be of assistance to you in the future!  Following is a copy of your care plan:   Goals Addressed             This Visit's Progress    COMPLETED: VBCI Social Work Care Plan       Current barriers:   SDOH Barrier- need for legal resources  Clinical Goals: Patient will work with legal aide and discrimination agencies/resources to address needs related to gaining legal advice to help keep stable housing   Clinical Interventions:  Inter-disciplinary care team collaboration (see longitudinal plan of care) Patient provided patient history.  Assessment of needs, progress, barriers completed. Advised patient to answer all calls from care providers and to keep phone nearby. Advised parents to contact 911 or 988 if a crisis occurs.  Clinical interventions provided:Solution-Focused Strategies, Active listening / Reflection utilized , Problem Solving /Task Center  Patient denied wanting in home support resources Patient denied wanting long term placement Patient denied wanting socialization resources Patient denied BH therapy or psychiatry referral Patient denied housing resources Patient's main support/close friend and neighbor Lu was present during phone interview. Patient/friend already have resource numbers to call (legal aid and discrimination resources) provided to them by PCP. Patient/caregiver deny any further social work needs. Patient will contact them today per friend. Patient and friend know to contact VBCI program in the future if they wish to enroll in CCM services.   Patient Goals/Self-Care Activities: Call provider office for new concerns or questions Consider bumping up self-care  Call [phone] in case of an emergency Contact VBCI CM team if you desire to start services        Please call the Suicide and Crisis Lifeline: 988 call the [place]  National Suicide  Prevention Lifeline: 408-131-4787 or TTY: 9895933519 TTY 435-162-0503) to talk to a trained counselor call 1-800-273-TALK (toll free, 24 hour hotline) go to [facility] [address], [place] [phone]) call 911 if you are experiencing a Mental Health or Behavioral Health Crisis or need someone to talk to.  Patient verbalizes understanding of instructions and care plan provided today and agrees to view in MyChart. Active MyChart status and patient understanding of how to access instructions and care plan via MyChart confirmed with patient.     Lyle Rung, BSW, MSW, LCSW Licensed Clinical Social Worker [employer]   [email or web address] Direct Dial: [phone]

## 2023-10-16 ENCOUNTER — Encounter: Payer: Self-pay | Admitting: Pediatrics

## 2023-10-16 DIAGNOSIS — Z658 Other specified problems related to psychosocial circumstances: Secondary | ICD-10-CM | POA: Insufficient documentation

## 2023-10-16 NOTE — Assessment & Plan Note (Signed)
 Intermittent HR in low 100s at home, suspect will improve further with higher dilt dose.

## 2023-10-16 NOTE — Assessment & Plan Note (Signed)
 Continues to be outside goal though could be exacerbated by recent stress. Plan to have her increase dilt to 240. I do think she may also benefit from moving up her cardiology visit.

## 2023-10-22 ENCOUNTER — Other Ambulatory Visit: Payer: Self-pay | Admitting: Pediatrics

## 2023-10-22 DIAGNOSIS — I10 Essential (primary) hypertension: Secondary | ICD-10-CM

## 2023-10-23 ENCOUNTER — Telehealth: Payer: Self-pay

## 2023-10-23 NOTE — Telephone Encounter (Signed)
 Requested Prescriptions  Refused Prescriptions Disp Refills   olmesartan  (BENICAR ) 5 MG tablet [Pharmacy Med Name: OLMESARTAN  MEDOXOMIL 5 MG TAB] 30 tablet 0    Sig: Take 1 tablet (5 mg total) by mouth daily.     Cardiovascular:  Angiotensin Receptor Blockers Failed - 10/23/2023  5:42 PM      Failed - Cr in normal range and within 180 days    Creat  Date Value Ref Range Status  09/06/2022 0.73 0.60 - 0.95 mg/dL Final   Creatinine, Ser  Date Value Ref Range Status  09/13/2023 1.26 (H) 0.57 - 1.00 mg/dL Final         Failed - Last BP in normal range    BP Readings from Last 1 Encounters:  10/03/23 (!) 186/103         Passed - K in normal range and within 180 days    Potassium  Date Value Ref Range Status  09/13/2023 4.3 3.5 - 5.2 mmol/L Final  04/04/2013 4.3 3.5 - 5.1 mmol/L Final         Passed - Patient is not pregnant      Passed - Valid encounter within last 6 months    Recent Outpatient Visits           2 weeks ago Paroxysmal atrial fibrillation [facility])   [place] [facility] Herold Hadassah SQUIBB, MD   1 month ago Essential hypertension   [place] [facility] Herold Hadassah SQUIBB, MD   2 months ago Excessive sleepiness   [place] [facility] Herold Hadassah SQUIBB, MD   3 months ago Encounter for Medicare annual wellness exam   [place] [facility] Herold Hadassah SQUIBB, MD   3 months ago Other fatigue   [facility] Herold Hadassah SQUIBB, MD

## 2023-10-23 NOTE — Telephone Encounter (Signed)
 Called and spoke with Lu she states things are still the same pt has been pushing off landlord coming to do the inspection so he is planning to come this Saturday. Lu states pt blood pressure is getting better but its still running high

## 2023-10-23 NOTE — Progress Notes (Signed)
 Attempted to reach Lu to check in on patient left vm for a return call.

## 2023-10-23 NOTE — Telephone Encounter (Unsigned)
 Copied from CRM #[id number]. Topic: Clinical - Medical Advice >> Oct 23, 2023 10:22 AM Anairis L wrote: Reason for CRM: Mrs. Vanvoorhis neighbor Macie Specking is calling because she received a call from [place] today and is requesting a call back [phone]. I see no note. Thank you.

## 2023-11-01 ENCOUNTER — Encounter: Payer: Self-pay | Admitting: Pediatrics

## 2023-11-01 ENCOUNTER — Ambulatory Visit (INDEPENDENT_AMBULATORY_CARE_PROVIDER_SITE_OTHER): Admitting: Pediatrics

## 2023-11-01 VITALS — BP 148/83 | HR 106 | Temp 97.9°F | Wt 140.2 lb

## 2023-11-01 DIAGNOSIS — M25551 Pain in right hip: Secondary | ICD-10-CM | POA: Diagnosis not present

## 2023-11-01 DIAGNOSIS — I10 Essential (primary) hypertension: Secondary | ICD-10-CM

## 2023-11-01 DIAGNOSIS — I480 Paroxysmal atrial fibrillation: Secondary | ICD-10-CM

## 2023-11-01 DIAGNOSIS — F419 Anxiety disorder, unspecified: Secondary | ICD-10-CM

## 2023-11-01 DIAGNOSIS — Z59819 Housing instability, housed unspecified: Secondary | ICD-10-CM

## 2023-11-01 DIAGNOSIS — F339 Major depressive disorder, recurrent, unspecified: Secondary | ICD-10-CM | POA: Diagnosis not present

## 2023-11-01 NOTE — Patient Instructions (Signed)
 The social worker will call

## 2023-11-02 ENCOUNTER — Telehealth: Payer: Self-pay

## 2023-11-02 NOTE — Telephone Encounter (Signed)
 Attempted to reach pt sister Rollene Margo on [place] please transfer call to me if call is returned

## 2023-11-03 NOTE — Progress Notes (Signed)
 Office Visit  BP (!) 148/83   Pulse (!) 106   Temp 97.9 F (36.6 C) (Oral)   Wt 140 lb 3.2 oz (63.6 kg)   SpO2 94%   BMI 28.32 kg/m    Subjective:    Patient ID: Samantha [name], female    DOB: [birth year], 89 y.o.   MRN: [id number]  HPI: Samantha [name] is a 89 y.o. female  Chief Complaint  Patient presents with   Hypertension    Discussed the use of AI scribe software for clinical note transcription with the patient, who gave verbal consent to proceed.  History of Present Illness   Samantha [name] is a 89 year old female who presents with housing-related stress and mental health concerns.  She is experiencing significant stress due to ongoing issues with her landlord, including pressure to clean out her house and increased rent. Having lived in her home for 23 years, she feels overwhelmed by the demands to remove her belongings. Her rent has been raised three times in the last six months, now totaling $1,000, and she believes her landlord wants her out to charge higher rent. This situation has caused considerable distress, and she wants to move if she can find a suitable place.  She feels extremely tired, having slept for a day and a half recently, and reports difficulty completing tasks due to fatigue. She describes a recent fall where she hit her hip against the bed frame, resulting in persistent pain despite using Tylenol  and Voltaren gel. She reports persistent pain in her hip and states she is unable to lift items or access her attic.  She feels overwhelmed and has considered self-harm due to the stress of her situation. She feels unsupported by those around her, including her sister and social services, and is frustrated by the lack of assistance in resolving her housing issues.  She is currently taking Prozac , with a dosage of two 20 mg tablets daily, and has recently received a flu shot. She reports no issues with her current medication regimen but is concerned about her  overall health due to the stress she is experiencing.        Relevant past medical, surgical, family and social history reviewed and updated as indicated. Interim medical history since our last visit reviewed. Allergies and medications reviewed and updated.  ROS per HPI unless specifically indicated above     Objective:    BP (!) 148/83   Pulse (!) 106   Temp 97.9 F (36.6 C) (Oral)   Wt 140 lb 3.2 oz (63.6 kg)   SpO2 94%   BMI 28.32 kg/m   Wt Readings from Last 3 Encounters:  11/01/23 140 lb 3.2 oz (63.6 kg)  10/03/23 136 lb 6.4 oz (61.9 kg)  09/13/23 138 lb 6.4 oz (62.8 kg)     Physical Exam Constitutional:      Appearance: Normal appearance.  Pulmonary:     Effort: Pulmonary effort is normal.  Musculoskeletal:        General: Normal range of motion.  Skin:    Comments: Normal skin color  Neurological:     General: No focal deficit present.     Mental Status: She is alert. Mental status is at baseline.  Psychiatric:        Mood and Affect: Mood normal.        Behavior: Behavior normal.        Thought Content: Thought content normal.  11/01/2023    1:33 PM 10/05/2023    2:17 PM 10/03/2023    2:35 PM 09/13/2023    2:29 PM 08/23/2023    3:15 PM  Depression screen PHQ 2/9  Decreased Interest 3 2 3 3 3   Down, Depressed, Hopeless 3 2 3 3 3   PHQ - 2 Score 6 4 6 6 6   Altered sleeping 3 1 3 3 3   Tired, decreased energy 3 1 3 3 3   Change in appetite 3 0 3 3 3   Feeling bad or failure about yourself  3 1 3 3 3   Trouble concentrating 3 0 3 3 3   Moving slowly or fidgety/restless 3 1 3 3 3   Suicidal thoughts 3 0 3 3 3   PHQ-9 Score 27 8 27 27 27   Difficult doing work/chores Very difficult Somewhat difficult Very difficult Not difficult at all        11/01/2023    1:34 PM 10/05/2023    2:40 PM 10/03/2023    2:36 PM 09/13/2023    2:29 PM  GAD 7 : Generalized Anxiety Score  Nervous, Anxious, on Edge 3 1 3 3   Control/stop worrying 3 1 3 3   Worry too much -  different things 3 2 3 3   Trouble relaxing 3 1 3 3   Restless 3 1 3 3   Easily annoyed or irritable 3 1 3 3   Afraid - awful might happen 3 1 3 3   Total GAD 7 Score 21 8 21 21   Anxiety Difficulty Very difficult Somewhat difficult Very difficult Extremely difficult       Assessment & Plan:  Assessment & Plan   Paroxysmal atrial fibrillation (HCC) Assessment & Plan: On dilt and eliquis . Recent fall but no head trauma. CTM.    Essential hypertension Assessment & Plan: Improved from most recent change. Given age, goal <150/90 reasonable. Continue dilt 240mg .   Episode of recurrent major depressive disorder, unspecified depression episode severity Anxiety Assessment & Plan: Significant distress from housing issues contributing to mental health decline and self-harm thoughts. No active safety concerns today. - Refer to Child psychotherapist for housing and mental health support. - Provide legal assistance contact for housing issues. - Maintain current depression medication prozac  40mg   Pain of right hip Persistent soreness without fracture after fall. - Provide Voltaren gel for pain relief. - Continue Tylenol  for pain management.  Housing instability Will reach out to social work team to talk to patient sister again.    Collateral info: I called patient's sister Samantha [name] to discuss after our visit and to get more collateral information. Sister is unclear about the issue with the landlord or nature of relationship with other reported caregiver Samantha [name] but she will get more information and let us  know.    Follow up plan: No follow-ups on file.  Hadassah SHAUNNA Nett, MD

## 2023-11-07 ENCOUNTER — Encounter: Payer: Self-pay | Admitting: Pediatrics

## 2023-11-07 DIAGNOSIS — F339 Major depressive disorder, recurrent, unspecified: Secondary | ICD-10-CM | POA: Insufficient documentation

## 2023-11-07 NOTE — Assessment & Plan Note (Signed)
 On dilt and eliquis . Recent fall but no head trauma. CTM.

## 2023-11-07 NOTE — Assessment & Plan Note (Addendum)
 Significant distress from housing issues contributing to mental health decline and self-harm thoughts. No active safety concerns today. - Refer to Child psychotherapist for housing and mental health support. - Provide legal assistance contact for housing issues. - Maintain current depression medication prozac  40mg 

## 2023-11-07 NOTE — Assessment & Plan Note (Signed)
 Improved from most recent change. Given age, goal <150/90 reasonable. Continue dilt 240mg .

## 2023-11-15 ENCOUNTER — Ambulatory Visit

## 2023-11-17 ENCOUNTER — Ambulatory Visit: Payer: Self-pay

## 2023-11-17 NOTE — Telephone Encounter (Signed)
 FYI Only or Action Required?: FYI only for provider.  Patient was last seen in primary care on 11/01/2023 by Herold Hadassah SQUIBB, MD.  Called Nurse Triage reporting Fall and Rib Injury.  Symptoms began several days ago.  Interventions attempted: OTC medications: Tylenol  and Ice/heat application.  Symptoms are: severe left rib/chest pain under breast, left knee bruise and pain, left leg/foot/toes pain stable. (Left knee improved after ice application)  Triage Disposition: Go to ED Now (Notify PCP)  Patient/caregiver understands and will follow disposition?: Yes                Copied from CRM #[id number]. Topic: Clinical - Red Word Triage >> Nov 17, 2023  1:25 PM Avram MATSU wrote: Red Word that prompted transfer to Nurse Triage: patient fell going to [employer], thinks she broke a rip. In pain. Reason for Disposition  SEVERE chest pain  Answer Assessment - Initial Assessment Questions 1. MECHANISM: How did the injury happen?     Patient states she has absolutely no idea how she fell. She went through the door at [place] and didn't trip on anything, just fell.   2. ONSET: When did the injury happen? (.e.g., minutes, hours, days ago)     11/15/23.  3. LOCATION: Where on the chest is the injury located?     Left side, just under the breast. Also injured left leg and left foot/toes.  4. APPEARANCE: What does the injury look like?     No bruising/swelling/cuts/scrape to left chest. Left knee bruise.  5. BLEEDING: Is there any bleeding now? If Yes, ask: How long has it been bleeding?     No.  6. SEVERITY: Any difficulty with breathing?     She is able to take a deep breath but states it is painful.  7. SIZE: For cuts, bruises, or swelling, ask: How large is it? (e.g., inches or centimeters)     Left knee bruise is improving after applying ice (also states pain is improving).  8. PAIN: Is there pain? If Yes, ask: How bad is the pain? (e.g., Scale 0-10; none,  mild, moderate, severe)     10/10 left rib/chest under breast.  9. TETANUS: For any breaks in the skin, ask: When was your last tetanus booster?     N/A.  Protocols used: Chest Injury-A-AH

## 2023-11-20 ENCOUNTER — Other Ambulatory Visit: Payer: Self-pay

## 2023-11-20 ENCOUNTER — Emergency Department

## 2023-11-20 ENCOUNTER — Encounter: Payer: Self-pay | Admitting: *Deleted

## 2023-11-20 ENCOUNTER — Other Ambulatory Visit: Payer: Self-pay | Admitting: Licensed Clinical Social Worker

## 2023-11-20 ENCOUNTER — Ambulatory Visit
Admission: EM | Admit: 2023-11-20 | Discharge: 2023-11-20 | Attending: Emergency Medicine | Admitting: Emergency Medicine

## 2023-11-20 ENCOUNTER — Encounter: Payer: Self-pay | Admitting: Licensed Clinical Social Worker

## 2023-11-20 ENCOUNTER — Ambulatory Visit (INDEPENDENT_AMBULATORY_CARE_PROVIDER_SITE_OTHER)

## 2023-11-20 ENCOUNTER — Ambulatory Visit (HOSPITAL_COMMUNITY): Payer: Self-pay

## 2023-11-20 ENCOUNTER — Emergency Department
Admission: EM | Admit: 2023-11-20 | Discharge: 2023-11-20 | Disposition: A | Discharge status: Home / Self Care | Attending: Emergency Medicine | Admitting: Emergency Medicine

## 2023-11-20 DIAGNOSIS — R0789 Other chest pain: Secondary | ICD-10-CM

## 2023-11-20 DIAGNOSIS — I482 Chronic atrial fibrillation, unspecified: Secondary | ICD-10-CM | POA: Insufficient documentation

## 2023-11-20 DIAGNOSIS — W19XXXA Unspecified fall, initial encounter: Secondary | ICD-10-CM | POA: Insufficient documentation

## 2023-11-20 DIAGNOSIS — R079 Chest pain, unspecified: Secondary | ICD-10-CM

## 2023-11-20 DIAGNOSIS — R0781 Pleurodynia: Secondary | ICD-10-CM | POA: Insufficient documentation

## 2023-11-20 DIAGNOSIS — I10 Essential (primary) hypertension: Secondary | ICD-10-CM

## 2023-11-20 DIAGNOSIS — M25552 Pain in left hip: Secondary | ICD-10-CM | POA: Insufficient documentation

## 2023-11-20 DIAGNOSIS — Z7901 Long term (current) use of anticoagulants: Secondary | ICD-10-CM | POA: Insufficient documentation

## 2023-11-20 DIAGNOSIS — S2242XA Multiple fractures of ribs, left side, initial encounter for closed fracture: Secondary | ICD-10-CM

## 2023-11-20 DIAGNOSIS — S299XXA Unspecified injury of thorax, initial encounter: Secondary | ICD-10-CM | POA: Diagnosis present

## 2023-11-20 LAB — CBC
HCT: 43.5 % (ref 36.0–46.0)
Hemoglobin: 14.4 g/dL (ref 12.0–15.0)
MCH: 30.5 pg (ref 26.0–34.0)
MCHC: 33.1 g/dL (ref 30.0–36.0)
MCV: 92.2 fL (ref 80.0–100.0)
Platelets: 235 K/uL (ref 150–400)
RBC: 4.72 MIL/uL (ref 3.87–5.11)
RDW: 13.4 % (ref 11.5–15.5)
WBC: 8.3 K/uL (ref 4.0–10.5)
nRBC: 0 % (ref 0.0–0.2)

## 2023-11-20 LAB — BASIC METABOLIC PANEL WITH GFR
Anion gap: 14 (ref 5–15)
BUN: 15 mg/dL (ref 8–23)
CO2: 27 mmol/L (ref 22–32)
Calcium: 9.8 mg/dL (ref 8.9–10.3)
Chloride: 101 mmol/L (ref 98–111)
Creatinine, Ser: 0.91 mg/dL (ref 0.44–1.00)
GFR, Estimated: 59 mL/min — ABNORMAL LOW (ref >60)
Glucose, Bld: 177 mg/dL — ABNORMAL HIGH (ref 70–99)
Potassium: 3.3 mmol/L — ABNORMAL LOW (ref 3.5–5.1)
Sodium: 142 mmol/L (ref 135–145)

## 2023-11-20 LAB — PROTIME-INR
INR: 1.2 (ref 0.8–1.2)
Prothrombin Time: 15.8 s — ABNORMAL HIGH (ref 11.4–15.2)

## 2023-11-20 LAB — TROPONIN I (HIGH SENSITIVITY): Troponin I (High Sensitivity): 13 ng/L (ref <18)

## 2023-11-20 LAB — D-DIMER, QUANTITATIVE: D-Dimer, Quant: 0.54 ug{FEU}/mL — ABNORMAL HIGH (ref 0.00–0.50)

## 2023-11-20 MED ORDER — LIDOCAINE 5 % EX PTCH
1.0000 | MEDICATED_PATCH | CUTANEOUS | Status: DC
  Administered 2023-11-20: 1 via TRANSDERMAL
  Filled 2023-11-20: qty 1

## 2023-11-20 MED ORDER — ACETAMINOPHEN 500 MG PO TABS
1000.0000 mg | ORAL_TABLET | Freq: Once | ORAL | Status: AC
  Administered 2023-11-20: 1000 mg via ORAL
  Filled 2023-11-20: qty 2

## 2023-11-20 MED ORDER — MORPHINE SULFATE (PF) 2 MG/ML IV SOLN
2.0000 mg | Freq: Once | INTRAVENOUS | Status: AC
  Administered 2023-11-20: 2 mg via INTRAVENOUS
  Filled 2023-11-20: qty 1

## 2023-11-20 MED ORDER — ACETAMINOPHEN 325 MG PO TABS
975.0000 mg | ORAL_TABLET | Freq: Once | ORAL | Status: AC
  Administered 2023-11-20: 975 mg via ORAL

## 2023-11-20 NOTE — Discharge Instructions (Signed)
 You were seen in the emergency department with left-sided rib pain.  You had a CT scan that showed old rib fractures that were on a CT scan in 2023 but did not show any new fractures from your fall last week.  Your heart enzyme was normal and do not believe you are having a heart attack.  You had a screening blood test done I do not believe that you have a blood clot.  You can use over-the-counter Lidoderm  patch, you can get these at [facility] or [facility], place a patch where you are hurting to your left chest wall keep on for 12 hours and then remove. Take 1000 mg of acetaminophen  (Tylenol ) every 6-8 hours as needed for pain control.  Follow-up closely with your primary care physician and return to the emergency department for any worsening symptoms.

## 2023-11-20 NOTE — ED Provider Notes (Signed)
 [facility] Provider Note    Event Date/Time   First MD Initiated Contact with Patient 11/20/23 1720     (approximate)   History   Rib Injury   HPI  Samantha [name] is a 89 y.o. female past medical history significant for hypertension, atrial fibrillation paroxysmal on Eliquis , frequent falls, who presents to the emergency department with left-sided chest pain.  States that she had a fall 1 week ago when she was     Physical Exam   Triage Vital Signs: ED Triage Vitals  Encounter Vitals Group     BP 11/20/23 1608 (!) 189/87     Girls Systolic BP Percentile --      Girls Diastolic BP Percentile --      Boys Systolic BP Percentile --      Boys Diastolic BP Percentile --      Pulse Rate 11/20/23 1608 (!) 110     Resp 11/20/23 1608 20     Temp 11/20/23 1608 98.1 F (36.7 C)     Temp Source 11/20/23 1608 Oral     SpO2 11/20/23 1608 95 %     Weight 11/20/23 1609 138 lb (62.6 kg)     Height 11/20/23 1609 5' (1.524 m)     Head Circumference --      Peak Flow --      Pain Score 11/20/23 1609 9     Pain Loc --      Pain Education --      Exclude from Growth Chart --     Most recent vital signs: Vitals:   11/20/23 1715 11/20/23 1735  BP: (!) 177/88 (!) 161/90  Pulse: (!) 101 91  Resp: 20 18  Temp:    SpO2: 100% 100%    Physical Exam  IMPRESSION / MDM / ASSESSMENT AND PLAN / ED COURSE  I reviewed the triage vital signs and the nursing notes.  *** EKG  I, Clotilda Punter, the attending physician, personally viewed and interpreted this ECG.   Rate: Normal  Rhythm: Normal sinus  Axis: Normal  Intervals: Normal  ST&T Change: None  No tachycardic or bradycardic dysrhythmias while on cardiac telemetry.  RADIOLOGY I independently reviewed imaging, my interpretation of imaging: ***  LABS (all labs ordered are listed, but only abnormal results are displayed) Labs interpreted as -    Labs Reviewed  PROTIME-INR - Abnormal; Notable for  the following components:      Result Value   Prothrombin Time 15.8 (*)    All other components within normal limits  CBC  BASIC METABOLIC PANEL WITH GFR     MDM   Clinical Course as of 11/20/23 2053  Mon Nov 20, 2023  2044 Called and discussed with radiologist given patient's reproducible chest wall tenderness to palpation.  Reviewed and rib fractures were present on prior CT scan in 2023 and did not have any obvious new acute rib fractures.  Given her pleuritic pain will do a screening D-dimer to further risk ratified for pulmonary embolism.  Troponin added on for further evaluation of possible ACS.  Will give a Lidoderm  patch and Tylenol  for pain control. [SM]    Clinical Course User Index [SM] Punter Clotilda, MD     PROCEDURES:  Critical Care performed: No  Procedures  Patient's presentation is most consistent with {EM COPA:27473}   MEDICATIONS ORDERED IN ED: Medications - No data to display  FINAL CLINICAL IMPRESSION(S) / ED DIAGNOSES   Final diagnoses:  None     Rx / DC Orders   ED Discharge Orders     None        Note:  This document was prepared using Dragon voice recognition software and may include unintentional dictation errors.

## 2023-11-20 NOTE — ED Provider Notes (Signed)
 HPI  SUBJECTIVE:  Samantha [name] is a 89 y.o. female who presents with sticking constant anterior left rib pain and shortness of breath secondary to pain after having a fall 1 week ago directly on anterior chest.  She states the pain started the next morning.  She is not sure why she fell but denies syncope, preceding chest pain, shortness of breath, palpitations.  She denies loss of consciousness, head injury.  no fevers, wheezing, coughing, chest bruising.  No antipyretic in the past 6 hours.  She has been taking Tylenol  500 mg 2-3 times a day with temporary improvement in her symptoms.  Symptoms are worse with deep inspiration and walking.  She has a past medical history of atrial fibrillation on Eliquis , aneurysm, coronary artery disease status post 2 stents, osteoporosis, hypertension, CVA, hyperlipidemia, stage IIIa chronic kidney disease.  PCP: [facility].  Past Medical History:  Diagnosis Date   A-fib (HCC) 06/07/2014   Arm fracture 03/10/2017   right   Bilateral sensorineural hearing loss 05/16/2023   Brain aneurysm    CAD (coronary artery disease) 06/07/2014   Closed right radial fracture 03/21/2017   Depression 09/24/2015   Dyslipidemia 08/16/2016   Dysphonia 08/16/2016   Elevated sed rate 07/07/2020   FTT (failure to thrive) in adult 02/14/2021   Hypertension    Mild aortic stenosis 08/22/2016   Echo July 2018   Mitral regurgitation 08/22/2016   Echo July 2018   Stroke [facility])    Vertigo    Wears hearing aid in right ear     Past Surgical History:  Procedure Laterality Date   ABDOMINAL HYSTERECTOMY     APPENDECTOMY     CHOLECYSTECTOMY     FRACTURE SURGERY      Family History  Problem Relation Age of Onset   Stroke Mother    Heart attack Father    Heart disease Sister    Diabetes Sister    Heart disease Brother    Heart attack Brother    Diabetes Maternal Grandfather    Ovarian cancer Paternal Grandmother    Cancer Brother        bladder  cancer   Diabetes Brother    Heart disease Brother    Hypertension Brother    Heart attack Daughter     Social History   Tobacco Use   Smoking status: Never   Smokeless tobacco: Never  Vaping Use   Vaping status: Never Used  Substance Use Topics   Alcohol use: No   Drug use: No    No current facility-administered medications for this encounter.  Current Outpatient Medications:    olmesartan  (BENICAR ) 5 MG tablet, Take 5 mg by mouth daily., Disp: , Rfl:    albuterol  (VENTOLIN  HFA) 108 (90 Base) MCG/ACT inhaler, Inhale 2 puffs into the lungs every 6 (six) hours as needed for wheezing or shortness of breath., Disp: 8 g, Rfl: 2   apixaban  (ELIQUIS ) 2.5 MG TABS tablet, Take 1 tablet (2.5 mg total) by mouth 2 (two) times daily., Disp: , Rfl:    atorvastatin  (LIPITOR) 80 MG tablet, Take 1 tablet (80 mg total) by mouth at bedtime., Disp: 90 tablet, Rfl: 3   Cholecalciferol  25 MCG (1000 UT) tablet, Take 1,000 Units by mouth daily., Disp: , Rfl:    diltiazem  (CARDIZEM  CD) 240 MG 24 hr capsule, Take 1 capsule (240 mg total) by mouth daily., Disp: 30 capsule, Rfl: 2   FLUoxetine  (PROZAC ) 20 MG tablet, Take 2 tablets (40 mg total)  by mouth daily., Disp: 180 tablet, Rfl: 3   fluticasone  (FLONASE ) 50 MCG/ACT nasal spray, Place 2 sprays into both nostrils daily., Disp: 16 g, Rfl: 6   Multiple Vitamin (MULTIVITAMIN) tablet, Take 1 tablet by mouth daily., Disp: , Rfl:   Allergies  Allergen Reactions   Lisinopril  Anaphylaxis    . Pt was admitted to [facility] 12/21-23 due to shortness of breath determined to be due to subglottic narrowing on laryngoscopy due to possible infection. Treated with antibiotics. Lisinopril  stopped due to concern for angioedema. D/C'd with ENT f/u.   Iodine Nausea And Vomiting   Levaquin [Levofloxacin] Other (See Comments)    Pt states she have nose bleeds and pain in arms and shoulders.     ROS  As noted in HPI.   Physical Exam  BP (!) 212/104 (BP Location: Left  Arm)   Pulse 88   Temp 98.4 F (36.9 C) (Oral)   Resp 16   Ht 5' (1.524 m)   Wt 62.6 kg   SpO2 97%   BMI 26.95 kg/m  BP Readings from Last 3 Encounters:  11/20/23 (!) 212/104  11/01/23 (!) 148/83  10/03/23 (!) 186/103    Constitutional: Well developed, well nourished, appears to be in moderate pain, especially with inspiration Eyes: PERRL, EOMI, conjunctiva normal bilaterally HENT: Normocephalic, atraumatic,mucus membranes moist Respiratory: Clear to auscultation bilaterally, no rales, no wheezing, no rhonchi.  No sternal tenderness.  Upper rib tenderness.  Tenderness immediately underneath the breast.  No crepitus, bruising, paradoxical chest wall movement. Cardiovascular: Normal rate and rhythm, no murmurs, no gallops, no rubs GI: nondistended skin: No rash, skin intact Musculoskeletal: no deformities Neurologic: Alert & oriented x 3, CN III-XII grossly intact, no motor deficits, sensation grossly intact Psychiatric: Speech and behavior appropriate   ED Course   Medications  acetaminophen  (TYLENOL ) tablet 975 mg (975 mg Oral Given 11/20/23 1424)    Orders Placed This Encounter  Procedures   DG Ribs Unilateral W/Chest Left    Standing Status:   Standing    Number of Occurrences:   1    Reason for Exam (SYMPTOM  OR DIAGNOSIS REQUIRED):   left rib pain after a fall 1 week ago   Recheck vitals    Standing Status:   Standing    Number of Occurrences:   1   ED EKG    Standing Status:   Standing    Number of Occurrences:   1    Reason for Exam:   Chest Pain   No results found for this or any previous visit (from the past 24 hours). No results found.  ED Clinical Impression  1. Closed fracture of multiple ribs of left side, initial encounter   2. Rib pain on left side   3. Elevated blood pressure reading with diagnosis of hypertension      ED Assessment/Plan    Calculated creatinine clearance based on labs from August 2025 28 mL/min.  1.  Rib injury.  Suspect  multiple rib fractures.  Left-sided rib series.  Reviewed imaging independently and discussed with radiology.  Definite fractures along ribs 4, 5, 6 and 7.  No pneumothorax. See radiology report for full details.  Patient has 4 definite rib fractures.  This could be an indication for admission, especially given her age.  Transferring to the emergency department for pain control, further assessment.  She may need advanced imaging to rule out additional fractures not seen on x-ray.  2. Elevated blood-pressure reading with diagnosis of  hypertension.  Patient appears to be uncomfortable.  I suspect that this is what is causing her blood pressure to be elevated.  She otherwise has no complaints other than the rib pain.  However, she may need additional workup to rule out endorgan injury.  Discussed rationale for transfer to the emergency department with patient and family member.  They are going to go to [facility].  They agreed to go there now.  Meds ordered this encounter  Medications   acetaminophen  (TYLENOL ) tablet 975 mg      *This clinic note was created using Scientist, clinical (histocompatibility and immunogenetics). Therefore, there may be occasional mistakes despite careful proofreading. ?    Van Knee, MD 11/20/23 1439

## 2023-11-20 NOTE — ED Triage Notes (Addendum)
 Pt to triage via wheelchair.  Pt sent from [facility] urgent care for eval of multiple left side rib fx's.   Pt fell 1 week ago.  Pt denies chest pain or sob.  Pt alert   speech clear.

## 2023-11-20 NOTE — ED Notes (Signed)
 Patient is being discharged from the Urgent Care and sent to the Emergency Department via POV . Per Dr.Mortenson, patient is in need of higher level of care due to rib fx. Patient is aware and verbalizes understanding of plan of care.  Vitals:   11/20/23 1237  BP: (!) 212/104  Pulse: 88  Resp: 16  Temp: 98.4 F (36.9 C)  SpO2: 97%

## 2023-11-20 NOTE — Discharge Instructions (Addendum)
 I am sending you to the emergency department because you have broken ribs #4, 5, 6 and 7.  You may have additional broken ribs that we are not able to see on x-ray.  You may need advanced imaging to assess the number of broken ribs that you have.  I am also sending you to the emergency department for pain control.  I do not think that Tylenol  is going to be enough for you.  Your blood pressure has been significantly elevated while here, I suspect that is due to the pain, but I am sending you so that they can get it under better control.

## 2023-11-20 NOTE — ED Triage Notes (Signed)
 Pt c/o L sided rib pain d/t fall x1 wk ago. States was going into [employer] & fell over. Denies any head injury or LOC.

## 2023-11-20 NOTE — Patient Instructions (Signed)
 Samantha [name] - I am sorry I was unable to reach you today for our appointment. I work with Herold, Hadassah SQUIBB, MD and am calling to support your healthcare needs. Please contact me at [phone] at your earliest convenience. I look forward to speaking with you soon.   Thank you,  Lyle Rung, BSW, MSW, LCSW Licensed Clinical Social Worker [employer]   [facility] [email or web address] Direct Dial: [phone]

## 2023-11-20 NOTE — ED Notes (Signed)
 No further testing at this time per dr dicky.  Pt had an xray at urgent care today.

## 2023-11-26 ENCOUNTER — Other Ambulatory Visit: Payer: Self-pay | Admitting: Pediatrics

## 2023-11-27 ENCOUNTER — Telehealth: Payer: Self-pay

## 2023-11-27 DIAGNOSIS — I480 Paroxysmal atrial fibrillation: Secondary | ICD-10-CM

## 2023-11-28 NOTE — Telephone Encounter (Signed)
 Requested medication (s) are due for refill today: historical medication  Requested medication (s) are on the active medication list: yes   Last refill:  09/19/23  Future visit scheduled: yes 11/29/23  Notes to clinic:  historical medication. Do you want to order Rx?     Requested Prescriptions  Pending Prescriptions Disp Refills   olmesartan  (BENICAR ) 5 MG tablet [Pharmacy Med Name: OLMESARTAN  MEDOXOMIL 5 MG TAB] 30 tablet 0    Sig: Take 1 tablet (5 mg total) by mouth daily.     Cardiovascular:  Angiotensin Receptor Blockers Failed - 11/28/2023 11:28 AM      Failed - K in normal range and within 180 days    Potassium  Date Value Ref Range Status  11/20/2023 3.3 (L) 3.5 - 5.1 mmol/L Final  04/04/2013 4.3 3.5 - 5.1 mmol/L Final         Failed - Last BP in normal range    BP Readings from Last 1 Encounters:  11/20/23 (!) 163/83         Passed - Cr in normal range and within 180 days    Creat  Date Value Ref Range Status  09/06/2022 0.73 0.60 - 0.95 mg/dL Final   Creatinine, Ser  Date Value Ref Range Status  11/20/2023 0.91 0.44 - 1.00 mg/dL Final         Passed - Patient is not pregnant      Passed - Valid encounter within last 6 months    Recent Outpatient Visits           3 weeks ago Paroxysmal atrial fibrillation [facility])   [facility] Herold Hadassah SQUIBB, MD   1 month ago Paroxysmal atrial fibrillation [facility])   [facility] Herold Hadassah SQUIBB, MD   2 months ago Essential hypertension   [place] [facility] Herold Hadassah SQUIBB, MD   3 months ago Excessive sleepiness   [place] [facility] Herold Hadassah SQUIBB, MD   4 months ago Encounter for [employer] annual wellness exam    [facility] Herold Hadassah SQUIBB, MD

## 2023-11-29 ENCOUNTER — Ambulatory Visit: Admitting: Pediatrics

## 2023-11-29 ENCOUNTER — Encounter: Payer: Self-pay | Admitting: Pediatrics

## 2023-11-29 VITALS — BP 142/78 | HR 105 | Temp 97.8°F | Ht 60.0 in | Wt 140.4 lb

## 2023-11-29 DIAGNOSIS — W19XXXS Unspecified fall, sequela: Secondary | ICD-10-CM | POA: Diagnosis not present

## 2023-11-29 DIAGNOSIS — Z23 Encounter for immunization: Secondary | ICD-10-CM

## 2023-11-29 DIAGNOSIS — I10 Essential (primary) hypertension: Secondary | ICD-10-CM | POA: Diagnosis not present

## 2023-11-29 DIAGNOSIS — I480 Paroxysmal atrial fibrillation: Secondary | ICD-10-CM

## 2023-11-29 MED ORDER — LIDOCAINE 5 % EX PTCH: 1.0000 | MEDICATED_PATCH | CUTANEOUS | 0 refills | Status: AC

## 2023-11-29 MED ORDER — OLMESARTAN MEDOXOMIL 5 MG PO TABS: 5.0000 mg | ORAL_TABLET | Freq: Every day | ORAL | 3 refills | Status: AC

## 2023-11-29 NOTE — Patient Instructions (Signed)
 Continue with lidocaine  patches, tylenol  and voltaren gel for pain management. We will check in again to see how the pain is doing in 1 month.

## 2023-11-29 NOTE — Progress Notes (Signed)
 Office Visit  BP (!) 142/78   Pulse (!) 105   Temp 97.8 F (36.6 C) (Oral)   Ht 5' (1.524 m)   Wt 140 lb 6.4 oz (63.7 kg)   SpO2 95%   BMI 27.42 kg/m    Subjective:    Patient ID: Samantha [name], [name]    DOB: 1928/11/03, 89 y.o.   MRN: [id number]  HPI: Samantha [name] is a 89 y.o. [name]  Chief Complaint  Patient presents with   Shortness of Breath    From broken ribs. Painful on inhalation.    Fall    10/15. 4 broken ribs   Extremity Weakness    Left side of body    Discussed the use of AI scribe software for clinical note transcription with the patient, who gave verbal consent to proceed.  History of Present Illness   Samantha [name] is a 89 year old [name] who presents with left-sided weakness and pain following a fall.  Samantha experiences significant weakness on her entire left side following a recent fall, with difficulty standing on her left leg and requiring assistance to get up from the toilet. Samantha can put strength on her right leg but not on her left side. No pain is associated with the weakness, but there is a lack of strength to stand up. No prior weakness before the fall.  Samantha reports memory issues, stating 'my memory's just going, period.' Samantha has difficulty recalling recent events and relies on her sister for details. No head trauma during the fall.  Following the fall, Samantha experiences pain when taking deep breaths, described as a crushing sensation. Initially, an urgent care clinic suggested Samantha had four broken ribs, but subsequent hospital imaging indicated no new fractures, only old rib fractures. Samantha uses over-the-counter lidocaine  patches and Voltaren gel for pain management, with limited relief. Her blood pressure was noted to be high at the urgent care clinic but stabilized later. No external bruising on her left side but acknowledges internal bruising. The fall was sudden, occurring when Samantha opened a door and fell without tripping or any apparent cause.  Samantha sustained bruises on her toes and knee during the fall.  Socially, Samantha is facing housing issues, with a landlord demanding Samantha vacate her home by the end of the year. Samantha is frustrated with the situation, particularly regarding the need to clear out her belongings, which Samantha is unable to do due to her physical limitations. 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 Her son, who lives in [place] , is involved in her financial matters, such as rent payments.      Relevant past medical, surgical, family and social history reviewed and updated as indicated. Interim medical history since our last visit reviewed. Allergies and medications reviewed and updated.  ROS per HPI unless specifically indicated above     Objective:    BP (!) 142/78   Pulse (!) 105   Temp 97.8 F (36.6 C) (Oral)   Ht 5' (1.524 m)   Wt 140 lb 6.4 oz (63.7 kg)   SpO2 95%   BMI 27.42 kg/m   Wt Readings from Last 3 Encounters:  11/29/23 140 lb 6.4 oz (63.7 kg)  11/20/23 138 lb (62.6 kg)  11/20/23 138 lb (62.6 kg)     Physical Exam Constitutional:      Appearance: Normal appearance.  Cardiovascular:  Rate and Rhythm: Normal rate and regular rhythm.  Pulmonary:     Effort: Pulmonary effort is normal.     Breath sounds: Normal breath sounds.  Musculoskeletal:        General: Normal range of motion.  Skin:    Comments: Normal skin color  Neurological:     General: No focal deficit present.     Mental Status: Samantha is alert. Mental status is at baseline.  Psychiatric:        Mood and Affect: Mood normal.        Behavior: Behavior normal.        Thought Content: Thought content normal.         11/01/2023    1:33 PM 10/05/2023    2:17 PM 10/03/2023    2:35 PM 09/13/2023    2:29 PM 08/23/2023    3:15 PM  Depression screen PHQ 2/9  Decreased Interest 3 2 3 3 3   Down, Depressed, Hopeless 3 2 3 3 3   PHQ - 2 Score 6 4 6  6 6   Altered sleeping 3 1 3 3 3   Tired, decreased energy 3 1 3 3 3   Change in appetite 3 0 3 3 3   Feeling bad or failure about yourself  3 1 3 3 3   Trouble concentrating 3 0 3 3 3   Moving slowly or fidgety/restless 3 1 3 3 3   Suicidal thoughts 3 0 3 3 3   PHQ-9 Score 27 8 27 27 27   Difficult doing work/chores Very difficult Somewhat difficult Very difficult Not difficult at all        11/01/2023    1:34 PM 10/05/2023    2:40 PM 10/03/2023    2:36 PM 09/13/2023    2:29 PM  GAD 7 : Generalized Anxiety Score  Nervous, Anxious, on Edge 3 1 3 3   Control/stop worrying 3 1 3 3   Worry too much - different things 3 2 3 3   Trouble relaxing 3 1 3 3   Restless 3 1 3 3   Easily annoyed or irritable 3 1 3 3   Afraid - awful might happen 3 1 3 3   Total GAD 7 Score 21 8 21 21   Anxiety Difficulty Very difficult Somewhat difficult Very difficult Extremely difficult       Assessment & Plan:  Assessment & Plan   Fall, sequela Post-fall pleuritic pain likely from bruising, not new fractures. Imaging confirmed chronic rib fractures without new ones. Pain consistent with pleurisy, possibly worsened by anticoagulation therapy. Expected recovery in 4-6 weeks. - Apply lidocaine  patches to affected area once every 24 hours. - Use Voltaren gel on the affected area. - Encourage rest and gentle stretching exercises. - Provide a prescription for 5% lidocaine  patches. - Print and provide imaging reports from both hospitals. - Consider referral to orthopedics if symptoms persist beyond 2-3 weeks for potential physical therapy. - Discuss housing and safety concerns with child psychotherapist. -     Lidocaine ; Place 1 patch onto the skin daily. Remove & Discard patch within 12 hours or as directed by MD  Dispense: 30 patch; Refill: 0  Essential hypertension Blood pressure well-controlled with current medication. Previous elevation likely due to pain and stress from fall. Given age, goal <150/90.  - Continue current  antihypertensive medication regimen: diltiazem  240mg , benicar  5mg . -     Olmesartan  Medoxomil; Take 1 tablet (5 mg total) by mouth daily.  Dispense: 90 tablet; Refill: 3  Paroxysmal atrial fibrillation (HCC) On dilt and eliquis . HR  elevated here but staying <110.  Need for vaccination -     Pfizer Comirnaty Covid-19 Vaccine 56yrs & older  Follow-Up Follow-up needed to monitor recovery from fall and manage ongoing health concerns. - Schedule follow-up appointment in 4 weeks to reassess pain and recovery progress. - Contact her son to update on health status after obtaining signed release form. - Coordinate with child psychotherapist for housing and safety concerns.    Follow up plan: Return in about 4 weeks (around 12/27/2023).  Hadassah SHAUNNA Nett, MD

## 2023-12-04 ENCOUNTER — Other Ambulatory Visit (HOSPITAL_COMMUNITY): Payer: Self-pay

## 2023-12-04 ENCOUNTER — Telehealth: Payer: Self-pay | Admitting: Pharmacy Technician

## 2023-12-04 ENCOUNTER — Encounter: Payer: Self-pay | Admitting: Pediatrics

## 2023-12-04 NOTE — Telephone Encounter (Signed)
 Pharmacy Patient Advocate Encounter  Received notification from [facility] MEDIC[facility] that Prior Authorization for Lidocaine  5% patches has been APPROVED from 12/04/23 to 01/31/24. Ran test claim, Copay is $0.00. This test claim was processed through [facility]- copay amounts may vary at other pharmacies due to pharmacy/plan contracts, or as the patient moves through the different stages of their insurance plan.   PA #/Case ID/Reference #: [id number]

## 2023-12-04 NOTE — Telephone Encounter (Signed)
 Pharmacy Patient Advocate Encounter   Received notification from Onbase that prior authorization for Lidocaine  5% patches is required/requested.   Insurance verification completed.   The patient is insured through [facility].   Per test claim: PA required; PA started via CoverMyMeds. KEY [id number] . Waiting for clinical questions to populate.

## 2023-12-14 ENCOUNTER — Other Ambulatory Visit: Payer: Self-pay | Admitting: *Deleted

## 2023-12-14 NOTE — Patient Instructions (Signed)
 SABRA

## 2023-12-15 NOTE — Patient Outreach (Signed)
 Complex Care Management   Visit Note  12/15/2023  Name:  Samantha [name] MRN: [id number] DOB: 03-Mar-1928  Situation: Referral received for Complex Care Management related to SDOH Barriers:  Housing concerns I obtained verbal consent from Patient.  Visit completed with Caregiver  on the phone on 12/14/23  Background:   Past Medical History:  Diagnosis Date   A-fib (HCC) 06/07/2014   Arm fracture 03/10/2017   right   Bilateral sensorineural hearing loss 05/16/2023   Brain aneurysm    CAD (coronary artery disease) 06/07/2014   Closed right radial fracture 03/21/2017   Depression 09/24/2015   Dyslipidemia 08/16/2016   Dysphonia 08/16/2016   Elevated sed rate 07/07/2020   FTT (failure to thrive) in adult 02/14/2021   Hypertension    Mild aortic stenosis 08/22/2016   Echo July 2018   Mitral regurgitation 08/22/2016   Echo July 2018   Stroke 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, [facility] has given her a hearing aid)      Cardiovascular   Cardiovascular Comment: [facility]  Respiratory Respiratory Symptoms Reported: No symptoms reported    Endocrine Endocrine Symptoms Reported: No symptoms reported Is patient diabetic?: No    Gastrointestinal Gastrointestinal Symptoms Reported: No symptoms reported      Genitourinary Genitourinary Symptoms Reported: Frequency    Integumentary Integumentary Symptoms Reported: Skin changes Skin Comment: has moles-upper body  Musculoskeletal Musculoskelatal Symptoms Reviewed:  Limited mobility Additional Musculoskeletal Details: uses a cain   Falls in the past year?: Yes (fell at [facility]-broke ribs) Number of falls in past year: 2 or more Was there an injury with Fall?: Yes Fall Risk Category Calculator: 3 Patient Fall Risk Level: High Fall Risk Patient at Risk for Falls Due to: History of fall(s)  Psychosocial Additional Psychological Details: high anxiety related to situation with her landlord-per friend, landlord is wanting her to clear her space-possible hoarding behaviors-threatens to evict her if she does not clear the space-has spoken to legal aid -will need to set up an account and will assign her a lawyer-patient has not been served court papers and has no court date yet  clutter requesting to be addressed by the end of the year to clean, currenlty has a month to month lease Behavioral Health Comment: has storage unit for many of her things Major Change/Loss/Stressor/Fears (CP): Legal concerns, Resources Quality of Family Relationships: involved, supportive Do you feel physically threatened by others?: No    12/15/2023    PHQ2-9 Depression Screening   Little interest or pleasure in doing things    Feeling down, depressed, or hopeless    PHQ-2 - Total Score    Trouble falling or staying asleep, or sleeping too much    Feeling tired or having little energy    Poor appetite or overeating     Feeling bad about yourself - or that you are a failure or have let yourself or your family down    Trouble concentrating on things, such as reading the newspaper or watching television  Moving or speaking so slowly that other people could have noticed.  Or the opposite - being so fidgety or restless that you have been moving around a lot more than usual    Thoughts that you would be better off dead, or hurting yourself in some way    PHQ2-9 Total Score    If you checked off any problems, how difficult have these problems made it for you to do your work, take care of  things at home, or get along with other people    Depression Interventions/Treatment      There were no vitals filed for this visit.    Medications Reviewed Today     Reviewed by Ermalinda Lenn HERO, LCSW (Social Worker) on 12/15/23 at 1150  Med List Status: <None>   Medication Order Taking? Sig Documenting Provider Last Dose Status Informant  albuterol  (VENTOLIN  HFA) 108 (90 Base) MCG/ACT inhaler [id number] Yes Inhale 2 puffs into the lungs every 6 (six) hours as needed for wheezing or shortness of breath. Amin, Sumayya, MD  Active   apixaban  (ELIQUIS ) 2.5 MG TABS tablet [id number] Yes Take 1 tablet (2.5 mg total) by mouth 2 (two) times daily. Bernardo Fend, DO  Active Self, Pharmacy Records  atorvastatin  (LIPITOR) 80 MG tablet [id number] Yes Take 1 tablet (80 mg total) by mouth at bedtime. Herold Hadassah SQUIBB, MD  Active   Cholecalciferol  25 MCG (1000 UT) tablet [id number] Yes Take 1,000 Units by mouth daily. [provider]  Active Self, Pharmacy Records           Med Note VASHTI, MARGARIE D   Tue Oct 22, 2019  9:31 AM)    diltiazem  (CARDIZEM  CD) 240 MG 24 hr capsule [id number] Yes Take 1 capsule (240 mg total) by mouth daily. Herold Hadassah SQUIBB, MD  Active   FLUoxetine  (PROZAC ) 20 MG tablet [id number] Yes Take 2 tablets (40 mg total) by mouth daily. Herold Hadassah SQUIBB, MD  Active   fluticasone  (FLONASE ) 50 MCG/ACT nasal spray [id number] Yes Place 2 sprays into both nostrils daily. Herold Hadassah SQUIBB, MD  Active   lidocaine  (LIDODERM ) 5 % [id number] Yes Place 1 patch onto the skin daily. Remove & Discard patch within 12 hours or as directed by MD Herold Hadassah SQUIBB, MD  Active   Multiple Vitamin (MULTIVITAMIN) tablet [id number] Yes Take 1 tablet by mouth daily. [provider]  Active Self, Pharmacy Records  olmesartan  (BENICAR ) 5 MG tablet [id number] Yes Take 1 tablet (5 mg total) by mouth daily. Herold Hadassah SQUIBB, MD  Active             Recommendation:   PCP Follow-up Follow up with legal  aid regarding tenant rights  Follow Up Plan:   Telephone follow up appointment date/time:  01/08/24  Lenn Ermalinda, LCSW [place]  [facility], [facility] Licensed Clinical Social Worker  Direct Dial: [phone]

## 2023-12-26 ENCOUNTER — Encounter: Payer: Self-pay | Admitting: Pediatrics

## 2023-12-26 ENCOUNTER — Ambulatory Visit: Admitting: Pediatrics

## 2023-12-26 VITALS — BP 134/86 | HR 106 | Temp 98.1°F | Ht 60.0 in | Wt 141.4 lb

## 2023-12-26 DIAGNOSIS — R410 Disorientation, unspecified: Secondary | ICD-10-CM

## 2023-12-26 DIAGNOSIS — R399 Unspecified symptoms and signs involving the genitourinary system: Secondary | ICD-10-CM

## 2023-12-26 DIAGNOSIS — Z59819 Housing instability, housed unspecified: Secondary | ICD-10-CM

## 2023-12-26 DIAGNOSIS — I480 Paroxysmal atrial fibrillation: Secondary | ICD-10-CM

## 2023-12-26 DIAGNOSIS — I10 Essential (primary) hypertension: Secondary | ICD-10-CM | POA: Diagnosis not present

## 2023-12-26 LAB — MICROSCOPIC EXAMINATION

## 2023-12-26 LAB — URINALYSIS, ROUTINE W REFLEX MICROSCOPIC
Glucose, UA: NEGATIVE
Leukocytes,UA: NEGATIVE
Nitrite, UA: NEGATIVE
Specific Gravity, UA: >1.03 — ABNORMAL HIGH (ref 1.005–1.030)
Urobilinogen, Ur: 1 mg/dL (ref 0.2–1.0)
pH, UA: 5 (ref 5.0–7.5)

## 2023-12-26 NOTE — Progress Notes (Signed)
 Office Visit  BP 134/86 (BP Location: Left Arm, Patient Position: Sitting, Cuff Size: Normal)   Pulse (!) 106   Temp 98.1 F (36.7 C) (Oral)   Ht 5' (1.524 m)   Wt 141 lb 6.4 oz (64.1 kg)   SpO2 94%   BMI 27.62 kg/m    Subjective:    Patient ID: Samantha [name], female    DOB: 09-Aug-1928, 89 y.o.   MRN: [id number]  HPI: Samantha [name] is a 89 y.o. female  Chief Complaint  Patient presents with   Atrial Fibrillation    4-week F/u. Patient stated she is not getting any better, and her sister stated she is very confused at times.    Discussed the use of AI scribe software for clinical note transcription with the patient, who gave verbal consent to proceed.  History of Present Illness   Samantha [name] is a 89 year old female who presents with memory issues and increased urinary frequency.  She experiences significant memory issues, including forgetting conversations and feeling confused about daily activities. An incident where she panicked due to an inability to operate her phone and watch led to anxiety. She does not remember phone calls from a social worker regarding housing and memory issues.  She reports increased urinary frequency, urinating approximately ten times the previous night. She experiences back pain severe enough to prevent her from getting into bed comfortably. This urinary issue started about a month ago and has persisted, causing concern about a possible kidney problem.  She lives independently, performing daily activities such as bathing, cooking, and laundry without assistance, though she acknowledges a slowing down in her ability to complete tasks. She is concerned about her landlord's demands to clear out her home and the potential for rent increases, which she feels are unfair and stressful.  She has a history of using [employer] benefits, which require her to have a non-family member assist with medical appointments. She pays Dene, a network engineer, for this service to  maintain her benefits.      Relevant past medical, surgical, family and social history reviewed and updated as indicated. Interim medical history since our last visit reviewed. Allergies and medications reviewed and updated.  ROS per HPI unless specifically indicated above     Objective:    BP 134/86 (BP Location: Left Arm, Patient Position: Sitting, Cuff Size: Normal)   Pulse (!) 106   Temp 98.1 F (36.7 C) (Oral)   Ht 5' (1.524 m)   Wt 141 lb 6.4 oz (64.1 kg)   SpO2 94%   BMI 27.62 kg/m   Wt Readings from Last 3 Encounters:  12/26/23 141 lb 6.4 oz (64.1 kg)  11/29/23 140 lb 6.4 oz (63.7 kg)  11/20/23 138 lb (62.6 kg)     Physical Exam Constitutional:      Appearance: Normal appearance.  Pulmonary:     Effort: Pulmonary effort is normal.  Musculoskeletal:        General: Normal range of motion.  Skin:    Comments: Normal skin color  Neurological:     General: No focal deficit present.     Mental Status: She is alert. Mental status is at baseline.  Psychiatric:        Mood and Affect: Mood normal.        Behavior: Behavior normal.        Thought Content: Thought content normal.         12/26/2023    1:26 PM 11/01/2023  1:33 PM 10/05/2023    2:17 PM 10/03/2023    2:35 PM 09/13/2023    2:29 PM  Depression screen PHQ 2/9  Decreased Interest 3 3 2 3 3   Down, Depressed, Hopeless 3 3 2 3 3   PHQ - 2 Score 6 6 4 6 6   Altered sleeping 3 3 1 3 3   Tired, decreased energy 3 3 1 3 3   Change in appetite 3 3 0 3 3  Feeling bad or failure about yourself  3 3 1 3 3   Trouble concentrating 3 3 0 3 3  Moving slowly or fidgety/restless 3 3 1 3 3   Suicidal thoughts 3 3 0 3 3  PHQ-9 Score 27 27  8  27  27    Difficult doing work/chores Very difficult Very difficult Somewhat difficult Very difficult Not difficult at all     Data saved with a previous flowsheet row definition       12/26/2023    1:26 PM 12/14/2023   12:13 PM 11/01/2023    1:34 PM 10/05/2023    2:40 PM  GAD 7  : Generalized Anxiety Score  Nervous, Anxious, on Edge 3 3 3 1   Control/stop worrying 3 3 3 1   Worry too much - different things 3 3 3 2   Trouble relaxing 3 3 3 1   Restless 3 3 3 1   Easily annoyed or irritable 3 3 3 1   Afraid - awful might happen 3 3 3 1   Total GAD 7 Score 21 21 21 8   Anxiety Difficulty Very difficult Very difficult Very difficult Somewhat difficult       Assessment & Plan:  Assessment & Plan   Confusion Housing instability Chronic confusion and memory impairment with recent exacerbation over the last month. Safety concerns at home due to memory issues. Resistant to memory clinic interventions or discussing with neurology.  - Discuss with social worker about housing and memory resources. - Consider independent or assisted living facilities for future safety. - Continue current medications. -     Basic metabolic panel with GFR -     CBC with Differential/Platelet  Essential hypertension Assessment & Plan: Patient well controlled on current regimen: diltiazem  240mg  daily, olmesartan  5mg .   Paroxysmal atrial fibrillation (HCC) Assessment & Plan: On dilt and eliquis . Continue to monitor, follows with cardiology.   Urinary tract infection symptoms Recent urinary frequency and back pain suggest possible urinary tract or kidney infection. - Ordered urinalysis to check for infection. - Ordered kidney function tests. - Will call with results if infection is present. -     Urinalysis, Routine w reflex microscopic -     Urine Culture -     Microscopic Examination   Follow up plan: Return in about 2 months (around 02/25/2024) for Chronic illness f/u.  Hadassah SHAUNNA Nett, MD

## 2023-12-27 LAB — BASIC METABOLIC PANEL WITH GFR
BUN/Creatinine Ratio: 14 (ref 12–28)
BUN: 13 mg/dL (ref 10–36)
CO2: 24 mmol/L (ref 20–29)
Calcium: 10 mg/dL (ref 8.7–10.3)
Chloride: 99 mmol/L (ref 96–106)
Creatinine, Ser: 0.94 mg/dL (ref 0.57–1.00)
Glucose: 106 mg/dL — ABNORMAL HIGH (ref 70–99)
Potassium: 4 mmol/L (ref 3.5–5.2)
Sodium: 141 mmol/L (ref 134–144)
eGFR: 57 mL/min/1.73 — ABNORMAL LOW (ref >59)

## 2023-12-27 LAB — CBC WITH DIFFERENTIAL/PLATELET
Basophils Absolute: 0 x10E3/uL (ref 0.0–0.2)
Basos: 0 %
EOS (ABSOLUTE): 0.1 x10E3/uL (ref 0.0–0.4)
Eos: 2 %
Hematocrit: 44.8 % (ref 34.0–46.6)
Hemoglobin: 14.7 g/dL (ref 11.1–15.9)
Immature Grans (Abs): 0 x10E3/uL (ref 0.0–0.1)
Immature Granulocytes: 0 %
Lymphocytes Absolute: 2.4 x10E3/uL (ref 0.7–3.1)
Lymphs: 28 %
MCH: 31.1 pg (ref 26.6–33.0)
MCHC: 32.8 g/dL (ref 31.5–35.7)
MCV: 95 fL (ref 79–97)
Monocytes Absolute: 0.8 x10E3/uL (ref 0.1–0.9)
Monocytes: 9 %
Neutrophils Absolute: 5.3 x10E3/uL (ref 1.4–7.0)
Neutrophils: 61 %
Platelets: 277 x10E3/uL (ref 150–450)
RBC: 4.73 x10E6/uL (ref 3.77–5.28)
RDW: 12.8 % (ref 11.7–15.4)
WBC: 8.7 x10E3/uL (ref 3.4–10.8)

## 2023-12-28 LAB — URINE CULTURE: Organism ID, Bacteria: NO GROWTH

## 2024-01-01 ENCOUNTER — Encounter: Payer: Self-pay | Admitting: Pediatrics

## 2024-01-01 NOTE — Assessment & Plan Note (Signed)
 On dilt and eliquis . Continue to monitor, follows with cardiology.

## 2024-01-01 NOTE — Assessment & Plan Note (Signed)
 Patient well controlled on current regimen: diltiazem  240mg  daily, olmesartan  5mg .

## 2024-01-02 ENCOUNTER — Ambulatory Visit: Payer: Self-pay | Admitting: Pediatrics

## 2024-01-02 NOTE — Addendum Note (Signed)
 Addended by: Karlita Lichtman M on: 01/02/2024 07:47 AM   Modules accepted: Orders

## 2024-01-08 ENCOUNTER — Encounter: Payer: Self-pay | Admitting: *Deleted

## 2024-01-08 ENCOUNTER — Telehealth: Payer: Self-pay | Admitting: *Deleted

## 2024-01-08 NOTE — Patient Instructions (Signed)
 Samantha [name] - I am sorry I was unable to reach you today for our scheduled appointment. I work with Herold, Hadassah SQUIBB, MD and am calling to support your healthcare needs. Please contact me at [phone] at your earliest convenience. I look forward to speaking with you soon.   Thank you,    Jendayi Berling, LCSW [place]  [facility], [facility] Licensed Clinical Social Worker  Direct Dial: [phone]

## 2024-01-30 ENCOUNTER — Other Ambulatory Visit: Payer: Self-pay | Admitting: *Deleted

## 2024-01-30 NOTE — Patient Instructions (Signed)
 Visit Information  Thank you for taking time to visit with me today. Please don't hesitate to contact me if I can be of assistance to you before our next scheduled appointment.  Your next care management appointment is by telephone on 02/14/24 at 3pm    Please call the care guide team at [phone] if you need to cancel, schedule, or reschedule an appointment.   Please call the Suicide and Crisis Lifeline: 988 call the [place]  National Suicide Prevention Lifeline: 754-483-3531 or TTY: 503-837-4771 TTY 614-139-9313) to talk to a trained counselor call 1-800-273-TALK (toll free, 24 hour hotline) call 911 if you are experiencing a Mental Health or Behavioral Health Crisis or need someone to talk to.  Rafiq Bucklin, LCSW [place]  [facility], [facility] Licensed Clinical Social Worker  Direct Dial: [phone]

## 2024-01-30 NOTE — Patient Outreach (Signed)
 Complex Care Management   Visit Note  01/30/2024  Name:  Samantha [name] MRN: [id number] DOB: Nov 29, 1928  Situation: Referral received for Complex Care Management related to SDOH Barriers:  Housing concerns I obtained verbal consent from Patient.  Visit completed with Caregiver  on the phone  Background:   Past Medical History:  Diagnosis Date   A-fib (HCC) 06/07/2014   Arm fracture 03/10/2017   right   Bilateral sensorineural hearing loss 05/16/2023   Brain aneurysm    CAD (coronary artery disease) 06/07/2014   Cerebral infarction (HCC) 06/07/2014   Closed right radial fracture 03/21/2017   Depression 09/24/2015   Dyslipidemia 08/16/2016   Dysphonia 08/16/2016   Elevated sed rate 07/07/2020   FTT (failure to thrive) in adult 02/14/2021   Hypertension    Mild aortic stenosis 08/22/2016   Echo July 2018   Mitral regurgitation 08/22/2016   Echo July 2018   Stroke [facility])    Vertigo    Wears hearing aid in right ear     Assessment: Patient continues to discuss concerns regarding her tenant rights. Strongly encouraged patient to contact legai aid for legal support and consultation. Patient Reported Symptoms:  Cognitive Cognitive Status: Alert and oriented to person, place, and time, Normal speech and language skills, Requires Assistance Decision Making   Health Maintenance Behaviors: Annual physical exam Healing Pattern: Average Health Facilitated by: Healthy diet, Rest  Neurological Neurological Review of Symptoms: No symptoms reported    HEENT HEENT Symptoms Reported: Change or loss of hearing (wears a hearing aid, however hearing remains a challenge) HEENT Management Strategies: Coping strategies    Cardiovascular Cardiovascular Symptoms Reported: No symptoms reported    Respiratory Respiratory Symptoms Reported: No symptoms reported    Endocrine Endocrine Symptoms Reported: No symptoms reported    Gastrointestinal Gastrointestinal Symptoms Reported: No symptoms  reported      Genitourinary Genitourinary Symptoms Reported: No symptoms reported    Integumentary Integumentary Symptoms Reported: No symptoms reported    Musculoskeletal Musculoskelatal Symptoms Reviewed: Limited mobility, Difficulty walking Additional Musculoskeletal Details: uses a cain when going out, per sister getting very feable        Psychosocial Additional Psychological Details: anxiety continues related to situation with landlord-per patient, he is increasing her rent and insisting that she clear out her attic-encouraged follow up with legal aid   Major Change/Loss/Stressor/Fears (CP): Legal concerns, Resources Quality of Family Relationships: involved, supportive    01/30/2024    PHQ2-9 Depression Screening   Little interest or pleasure in doing things    Feeling down, depressed, or hopeless    PHQ-2 - Total Score    Trouble falling or staying asleep, or sleeping too much    Feeling tired or having little energy    Poor appetite or overeating     Feeling bad about yourself - or that you are a failure or have let yourself or your family down    Trouble concentrating on things, such as reading the newspaper or watching television    Moving or speaking so slowly that other people could have noticed.  Or the opposite - being so fidgety or restless that you have been moving around a lot more than usual    Thoughts that you would be better off dead, or hurting yourself in some way    PHQ2-9 Total Score    If you checked off any problems, how difficult have these problems made it for you to do your work, take care of things at home,  or get along with other people    Depression Interventions/Treatment      There were no vitals filed for this visit.    Medications Reviewed Today     Reviewed by Ermalinda Lenn HERO, LCSW (Social Worker) on 01/30/24 at 1011  Med List Status: <None>   Medication Order Taking? Sig Documenting Provider Last Dose Status Informant  albuterol   (VENTOLIN  HFA) 108 (90 Base) MCG/ACT inhaler [id number] Yes Inhale 2 puffs into the lungs every 6 (six) hours as needed for wheezing or shortness of breath. Caleen Qualia, MD  Active   apixaban  (ELIQUIS ) 2.5 MG TABS tablet [id number] Yes Take 1 tablet (2.5 mg total) by mouth 2 (two) times daily. Bernardo Fend, DO  Active Self, Pharmacy Records  atorvastatin  (LIPITOR) 80 MG tablet [id number] Yes Take 1 tablet (80 mg total) by mouth at bedtime. Herold Hadassah SQUIBB, MD  Active   Cholecalciferol  25 MCG (1000 UT) tablet [id number] Yes Take 1,000 Units by mouth daily. [provider]  Active Self, Pharmacy Records           Med Note VASHTI, MARGARIE D   Tue Oct 22, 2019  9:31 AM)    diltiazem  (CARDIZEM  CD) 240 MG 24 hr capsule [id number] Yes Take 1 capsule (240 mg total) by mouth daily. Herold Hadassah SQUIBB, MD  Active   FLUoxetine  (PROZAC ) 20 MG tablet [id number] Yes Take 2 tablets (40 mg total) by mouth daily. Herold Hadassah SQUIBB, MD  Active   fluticasone  (FLONASE ) 50 MCG/ACT nasal spray [id number] Yes Place 2 sprays into both nostrils daily. Herold Hadassah SQUIBB, MD  Active   lidocaine  (LIDODERM ) 5 % [id number] Yes Place 1 patch onto the skin daily. Remove & Discard patch within 12 hours or as directed by MD Herold Hadassah SQUIBB, MD  Active   Multiple Vitamin (MULTIVITAMIN) tablet [id number] Yes Take 1 tablet by mouth daily. [provider]  Active Self, Pharmacy Records  olmesartan  (BENICAR ) 5 MG tablet [id number] Yes Take 1 tablet (5 mg total) by mouth daily. Herold Hadassah SQUIBB, MD  Active             Recommendation:   PCP Follow-up Follow up with legal aid  Follow Up Plan:   Telephone follow up appointment date/time:  02/14/24 3pm  Joleah Kosak, LCSW [place]  [facility], [facility] Licensed Clinical Social Worker  Direct Dial: [phone]

## 2024-02-14 ENCOUNTER — Other Ambulatory Visit: Payer: Self-pay | Admitting: *Deleted

## 2024-02-14 NOTE — Patient Outreach (Signed)
 Complex Care Management   Visit Note  02/14/2024  Name:  Samantha [name] MRN: [id number] DOB: [birth year]  Situation: Referral received for Complex Care Management related to landlord/tenant challenges I obtained verbal consent from Patient.  Visit completed with Patient  on the phone  Background:   Past Medical History:  Diagnosis Date   A-fib (HCC) 06/07/2014   Arm fracture 03/10/2017   right   Bilateral sensorineural hearing loss 05/16/2023   Brain aneurysm    CAD (coronary artery disease) 06/07/2014   Cerebral infarction (HCC) 06/07/2014   Closed right radial fracture 03/21/2017   Depression 09/24/2015   Dyslipidemia 08/16/2016   Dysphonia 08/16/2016   Elevated sed rate 07/07/2020   FTT (failure to thrive) in adult 02/14/2021   Hypertension    Mild aortic stenosis 08/22/2016   Echo July 2018   Mitral regurgitation 08/22/2016   Echo July 2018   Stroke [facility])    Vertigo    Wears hearing aid in right ear     Assessment: Patient Reported Symptoms:  Cognitive Cognitive Status: Alert and oriented to person, place, and time, Normal speech and language skills, Requires Assistance Decision Making Cognitive/Intellectual Conditions Management [RPT]: None reported or documented in medical history or problem list   Health Maintenance Behaviors: Annual physical exam Healing Pattern: Average Health Facilitated by: Rest, Healthy diet  Neurological Neurological Review of Symptoms: No symptoms reported    HEENT HEENT Symptoms Reported: Change or loss of hearing (hearing remains unchanged-wears a hearing aid) HEENT Management Strategies: Coping strategies    Cardiovascular Cardiovascular Symptoms Reported: No symptoms reported    Respiratory Respiratory Symptoms Reported: No symptoms reported    Endocrine Endocrine Symptoms Reported: No symptoms reported    Gastrointestinal Gastrointestinal Symptoms Reported: No symptoms reported      Genitourinary Genitourinary Symptoms  Reported: No symptoms reported    Integumentary Integumentary Symptoms Reported: No symptoms reported    Musculoskeletal Musculoskelatal Symptoms Reviewed: Limited mobility, Difficulty walking Additional Musculoskeletal Details: continues to use a cain when going out, weakness, sleeps often Musculoskeletal Management Strategies: Adequate rest, Coping strategies      Psychosocial Psychosocial Symptoms Reported: Anxiety - if selected complete GAD Additional Psychological Details: continues to have anxiety related to landlord. tenant relationship Behavioral Management Strategies: Coping strategies Major Change/Loss/Stressor/Fears (CP): Resources, Legal concerns Techniques to Cope with Loss/Stress/Change: Diversional activities Quality of Family Relationships: involved, non-existent, supportive    02/14/2024    PHQ2-9 Depression Screening   Little interest or pleasure in doing things    Feeling down, depressed, or hopeless    PHQ-2 - Total Score    Trouble falling or staying asleep, or sleeping too much    Feeling tired or having little energy    Poor appetite or overeating     Feeling bad about yourself - or that you are a failure or have let yourself or your family down    Trouble concentrating on things, such as reading the newspaper or watching television    Moving or speaking so slowly that other people could have noticed.  Or the opposite - being so fidgety or restless that you have been moving around a lot more than usual    Thoughts that you would be better off dead, or hurting yourself in some way    PHQ2-9 Total Score    If you checked off any problems, how difficult have these problems made it for you to do your work, take care of things at home, or get along with other  people    Depression Interventions/Treatment      There were no vitals filed for this visit.    Medications Reviewed Today     Reviewed by Ermalinda Lenn HERO, LCSW (Social Worker) on 02/14/24 at 1511  Med  List Status: <None>   Medication Order Taking? Sig Documenting Provider Last Dose Status Informant  albuterol  (VENTOLIN  HFA) 108 (90 Base) MCG/ACT inhaler [id number] Yes Inhale 2 puffs into the lungs every 6 (six) hours as needed for wheezing or shortness of breath. Amin, Sumayya, MD  Active   apixaban  (ELIQUIS ) 2.5 MG TABS tablet [id number] Yes Take 1 tablet (2.5 mg total) by mouth 2 (two) times daily. Bernardo Fend, DO  Active Self, Pharmacy Records  atorvastatin  (LIPITOR) 80 MG tablet [id number] Yes Take 1 tablet (80 mg total) by mouth at bedtime. Herold Hadassah SQUIBB, MD  Active   Cholecalciferol  25 MCG (1000 UT) tablet [id number] Yes Take 1,000 Units by mouth daily. [provider]  Active Self, Pharmacy Records           Med Note VASHTI, MARGARIE D   Tue Oct 22, 2019  9:31 AM)    diltiazem  (CARDIZEM  CD) 240 MG 24 hr capsule [id number] Yes Take 1 capsule (240 mg total) by mouth daily. Herold Hadassah SQUIBB, MD  Active   FLUoxetine  (PROZAC ) 20 MG tablet [id number] Yes Take 2 tablets (40 mg total) by mouth daily. Herold Hadassah SQUIBB, MD  Active   fluticasone  (FLONASE ) 50 MCG/ACT nasal spray [id number] Yes Place 2 sprays into both nostrils daily. Herold Hadassah SQUIBB, MD  Active   lidocaine  (LIDODERM ) 5 % [id number] Yes Place 1 patch onto the skin daily. Remove & Discard patch within 12 hours or as directed by MD Herold Hadassah SQUIBB, MD  Active   Multiple Vitamin (MULTIVITAMIN) tablet [id number] Yes Take 1 tablet by mouth daily. [provider]  Active Self, Pharmacy Records  olmesartan  (BENICAR ) 5 MG tablet [id number] Yes Take 1 tablet (5 mg total) by mouth daily. Herold Hadassah SQUIBB, MD  Active             Recommendation:   PCP Follow-up  Follow Up Plan:   Patient has met all care management goals. Care Management case will be closed. Patient has been provided contact information should new needs arise.    Calab Sachse, LCSW [place]  [facility], [facility] Licensed  Clinical Social Worker  Direct Dial: [phone]

## 2024-02-14 NOTE — Patient Instructions (Signed)
 Visit Information  Thank you for taking time to visit with me today. Please don't hesitate to contact me if I can be of assistance to you before our next scheduled appointment.  Your next care management appointment is no further scheduled appointments.    Patient has met all care management goals. Care Management case will be closed. Patient has been provided contact information should new needs arise.   Please call the care guide team at [phone] if you need to cancel, schedule, or reschedule an appointment.   Please call the Suicide and Crisis Lifeline: 988 call the [place]  National Suicide Prevention Lifeline: 317-830-3116 or TTY: 419-536-0878 TTY 205-633-9491) to talk to a trained counselor call 1-800-273-TALK (toll free, 24 hour hotline) call 911 if you are experiencing a Mental Health or Behavioral Health Crisis or need someone to talk to.  Kenzley Ke, LCSW [place]  [facility], [facility] Licensed Clinical Social Worker  Direct Dial: [phone]

## 2024-02-27 ENCOUNTER — Encounter: Admitting: Family Medicine
